# Patient Record
Sex: Female | Born: 1965 | Race: Black or African American | Hispanic: No | Marital: Married | Smoking: Never smoker
Health system: Southern US, Community
[De-identification: ages and names within clinical notes are randomized; demographics above are authoritative.]

## PROBLEM LIST (undated history)

## (undated) DIAGNOSIS — Z9189 Other specified personal risk factors, not elsewhere classified: Secondary | ICD-10-CM

## (undated) DIAGNOSIS — R5383 Other fatigue: Secondary | ICD-10-CM

## (undated) DIAGNOSIS — F32A Depression, unspecified: Secondary | ICD-10-CM

## (undated) DIAGNOSIS — R42 Dizziness and giddiness: Secondary | ICD-10-CM

## (undated) DIAGNOSIS — F419 Anxiety disorder, unspecified: Secondary | ICD-10-CM

## (undated) DIAGNOSIS — IMO0002 Reserved for concepts with insufficient information to code with codable children: Secondary | ICD-10-CM

## (undated) DIAGNOSIS — S82899A Other fracture of unspecified lower leg, initial encounter for closed fracture: Secondary | ICD-10-CM

## (undated) DIAGNOSIS — R0602 Shortness of breath: Secondary | ICD-10-CM

## (undated) HISTORY — DX: Other fatigue: R53.83

## (undated) HISTORY — PX: TUBAL LIGATION: SHX77

## (undated) HISTORY — DX: Other specified personal risk factors, not elsewhere classified: Z91.89

## (undated) HISTORY — DX: Reserved for concepts with insufficient information to code with codable children: IMO0002

## (undated) HISTORY — DX: Shortness of breath: R06.02

## (undated) HISTORY — PX: COLPOSCOPY: SHX161

## (undated) HISTORY — DX: Depression, unspecified: F32.A

## (undated) HISTORY — DX: Anxiety disorder, unspecified: F41.9

---

## 2001-04-26 ENCOUNTER — Other Ambulatory Visit: Admission: RE | Admit: 2001-04-26 | Discharge: 2001-04-26 | Payer: Self-pay | Admitting: Gynecology

## 2001-04-26 ENCOUNTER — Encounter (INDEPENDENT_AMBULATORY_CARE_PROVIDER_SITE_OTHER): Payer: Self-pay

## 2001-08-08 ENCOUNTER — Other Ambulatory Visit: Admission: RE | Admit: 2001-08-08 | Discharge: 2001-08-08 | Payer: Self-pay | Admitting: Gynecology

## 2002-04-14 ENCOUNTER — Other Ambulatory Visit: Admission: RE | Admit: 2002-04-14 | Discharge: 2002-04-14 | Payer: Self-pay | Admitting: Gynecology

## 2002-12-04 DIAGNOSIS — IMO0002 Reserved for concepts with insufficient information to code with codable children: Secondary | ICD-10-CM

## 2002-12-04 HISTORY — DX: Reserved for concepts with insufficient information to code with codable children: IMO0002

## 2003-06-10 ENCOUNTER — Other Ambulatory Visit: Admission: RE | Admit: 2003-06-10 | Discharge: 2003-06-10 | Payer: Self-pay | Admitting: Gynecology

## 2003-07-22 ENCOUNTER — Encounter: Payer: Self-pay | Admitting: Gynecology

## 2003-07-22 ENCOUNTER — Ambulatory Visit (HOSPITAL_COMMUNITY): Admission: RE | Admit: 2003-07-22 | Discharge: 2003-07-22 | Payer: Self-pay | Admitting: Gynecology

## 2003-10-05 ENCOUNTER — Other Ambulatory Visit: Admission: RE | Admit: 2003-10-05 | Discharge: 2003-10-05 | Payer: Self-pay | Admitting: Gynecology

## 2004-06-13 ENCOUNTER — Other Ambulatory Visit: Admission: RE | Admit: 2004-06-13 | Discharge: 2004-06-13 | Payer: Self-pay | Admitting: Gynecology

## 2004-09-01 ENCOUNTER — Ambulatory Visit (HOSPITAL_COMMUNITY): Admission: RE | Admit: 2004-09-01 | Discharge: 2004-09-01 | Payer: Self-pay | Admitting: Gynecology

## 2005-07-12 ENCOUNTER — Other Ambulatory Visit: Admission: RE | Admit: 2005-07-12 | Discharge: 2005-07-12 | Payer: Self-pay | Admitting: Gynecology

## 2006-01-17 ENCOUNTER — Ambulatory Visit (HOSPITAL_COMMUNITY): Admission: RE | Admit: 2006-01-17 | Discharge: 2006-01-17 | Payer: Self-pay | Admitting: Gynecology

## 2006-01-17 ENCOUNTER — Emergency Department (HOSPITAL_COMMUNITY): Admission: EM | Admit: 2006-01-17 | Discharge: 2006-01-17 | Payer: Self-pay | Admitting: Emergency Medicine

## 2006-08-15 ENCOUNTER — Other Ambulatory Visit: Admission: RE | Admit: 2006-08-15 | Discharge: 2006-08-15 | Payer: Self-pay | Admitting: Gynecology

## 2007-02-14 ENCOUNTER — Ambulatory Visit (HOSPITAL_COMMUNITY): Admission: RE | Admit: 2007-02-14 | Discharge: 2007-02-14 | Payer: Self-pay | Admitting: Gynecology

## 2007-08-26 ENCOUNTER — Other Ambulatory Visit: Admission: RE | Admit: 2007-08-26 | Discharge: 2007-08-26 | Payer: Self-pay | Admitting: Gynecology

## 2008-02-17 ENCOUNTER — Ambulatory Visit (HOSPITAL_COMMUNITY): Admission: RE | Admit: 2008-02-17 | Discharge: 2008-02-17 | Payer: Self-pay | Admitting: Gynecology

## 2008-04-22 ENCOUNTER — Ambulatory Visit: Payer: Self-pay | Admitting: Vascular Surgery

## 2008-09-02 ENCOUNTER — Ambulatory Visit: Payer: Self-pay | Admitting: Gynecology

## 2008-09-02 ENCOUNTER — Other Ambulatory Visit: Admission: RE | Admit: 2008-09-02 | Discharge: 2008-09-02 | Payer: Self-pay | Admitting: Gynecology

## 2008-09-02 ENCOUNTER — Encounter: Payer: Self-pay | Admitting: Gynecology

## 2008-10-05 ENCOUNTER — Ambulatory Visit: Payer: Self-pay | Admitting: Gynecology

## 2009-03-25 ENCOUNTER — Ambulatory Visit (HOSPITAL_COMMUNITY): Admission: RE | Admit: 2009-03-25 | Discharge: 2009-03-25 | Payer: Self-pay | Admitting: Gynecology

## 2009-11-09 ENCOUNTER — Other Ambulatory Visit: Admission: RE | Admit: 2009-11-09 | Discharge: 2009-11-09 | Payer: Self-pay | Admitting: Gynecology

## 2009-11-09 ENCOUNTER — Ambulatory Visit: Payer: Self-pay | Admitting: Gynecology

## 2009-11-15 ENCOUNTER — Ambulatory Visit: Payer: Self-pay | Admitting: Gynecology

## 2009-12-23 ENCOUNTER — Ambulatory Visit: Payer: Self-pay | Admitting: Gynecology

## 2010-01-04 HISTORY — PX: ABDOMINAL HYSTERECTOMY: SHX81

## 2010-01-19 ENCOUNTER — Ambulatory Visit: Payer: Self-pay | Admitting: Gynecology

## 2010-01-24 ENCOUNTER — Encounter: Payer: Self-pay | Admitting: Gynecology

## 2010-01-24 ENCOUNTER — Ambulatory Visit: Payer: Self-pay | Admitting: Gynecology

## 2010-01-24 ENCOUNTER — Inpatient Hospital Stay (HOSPITAL_COMMUNITY): Admission: RE | Admit: 2010-01-24 | Discharge: 2010-01-26 | Payer: Self-pay | Admitting: Gynecology

## 2010-02-07 ENCOUNTER — Ambulatory Visit: Payer: Self-pay | Admitting: Gynecology

## 2010-02-21 ENCOUNTER — Ambulatory Visit: Payer: Self-pay | Admitting: Gynecology

## 2010-03-03 ENCOUNTER — Ambulatory Visit: Payer: Self-pay | Admitting: Gynecology

## 2010-03-18 ENCOUNTER — Ambulatory Visit: Payer: Self-pay | Admitting: Gynecology

## 2010-03-28 ENCOUNTER — Ambulatory Visit (HOSPITAL_COMMUNITY): Admission: RE | Admit: 2010-03-28 | Discharge: 2010-03-28 | Payer: Self-pay | Admitting: Gynecology

## 2010-04-04 ENCOUNTER — Ambulatory Visit: Payer: Self-pay | Admitting: Gynecology

## 2010-11-11 ENCOUNTER — Other Ambulatory Visit
Admission: RE | Admit: 2010-11-11 | Discharge: 2010-11-11 | Payer: Self-pay | Source: Home / Self Care | Admitting: Gynecology

## 2010-11-11 ENCOUNTER — Ambulatory Visit: Payer: Self-pay | Admitting: Gynecology

## 2010-11-17 ENCOUNTER — Ambulatory Visit: Payer: Self-pay | Admitting: Gynecology

## 2010-12-25 ENCOUNTER — Encounter: Payer: Self-pay | Admitting: Gynecology

## 2011-02-22 LAB — CBC
HCT: 38.2 % (ref 36.0–46.0)
Hemoglobin: 10.5 g/dL — ABNORMAL LOW (ref 12.0–15.0)
MCHC: 33.1 g/dL (ref 30.0–36.0)
MCHC: 33.5 g/dL (ref 30.0–36.0)
MCV: 94.7 fL (ref 78.0–100.0)
Platelets: 185 10*3/uL (ref 150–400)
Platelets: 228 10*3/uL (ref 150–400)
RDW: 12.3 % (ref 11.5–15.5)
RDW: 12.3 % (ref 11.5–15.5)

## 2011-02-22 LAB — PREGNANCY, URINE: Preg Test, Ur: NEGATIVE

## 2011-02-24 ENCOUNTER — Other Ambulatory Visit: Payer: Self-pay | Admitting: Gynecology

## 2011-02-24 DIAGNOSIS — Z1231 Encounter for screening mammogram for malignant neoplasm of breast: Secondary | ICD-10-CM

## 2011-03-30 ENCOUNTER — Ambulatory Visit (HOSPITAL_COMMUNITY)
Admission: RE | Admit: 2011-03-30 | Discharge: 2011-03-30 | Disposition: A | Discharge status: Home / Self Care | Payer: BC Managed Care – PPO | Source: Ambulatory Visit | Attending: Gynecology | Admitting: Gynecology

## 2011-03-30 DIAGNOSIS — Z1231 Encounter for screening mammogram for malignant neoplasm of breast: Secondary | ICD-10-CM

## 2011-04-18 NOTE — Consult Note (Signed)
NEW PATIENT CONSULTATION   [name], [name]  DOB:  Oct 22, 1966                                       04/22/2008  [id number]#:[id number]   Laura [name] presents today for evaluation of lower extremity  symptoms.  She reports aching sensation in her legs, which is more  progressive at the end of the day.  She denies any swelling and does not  have any history of deep venous thrombosis or varicose veins.  She  reports this is equal in both legs and worse in her knees distally.   PAST HISTORY:  Significant for fatigue, SEASONAL ALLERGIES, history of  heart palpitations.   SURGICAL HISTORY:  Significant only for a prior cesarean section in  1993.   FAMILY HISTORY:  Negative for venous varicosities.   SOCIAL HISTORY:  She does not smoke, has rare social alcohol  consumption.   REVIEW OF SYSTEMS:  Otherwise negative.   PHYSICAL EXAM:  This is a well-developed, well-nourished black female  appearing her stated age 45.  She does have 2+ dorsalis pedis pulses  bilaterally.  She does not have any swelling, varicosities or reticular  veins in her lower extremities.  She does have scattered spider vein  telangiectasia most prominently in her popliteal space and her lateral  thighs bilaterally.   She underwent a screening handheld venous duplex by me and this shows  normal size great and small saphenous vein bilaterally with no evidence  of reflux.  I discussed the significance of this with [name].  I  explained that I do not see any evidence of arterial or venous pathology  to explain her tired achy sensation in her legs.  She does have  scattered spider vein telangiectasia and I explained the treatment for  these.  I did explain the option of sclerotherapy in our office for  cosmetic treatment of her spider veins.  I discussed the expected number  of sessions required and the out-of-pocket expense for this since  insurance would not cover this.  She will  consider this option and  notify us should she wish to proceed with sclerotherapy.   Larina Earthly, M.D.  Electronically Signed   TFE/MEDQ  D:  04/22/2008  T:  04/23/2008  Job:  [id number]   cc:   Thora Lance, M.D.

## 2012-03-07 ENCOUNTER — Other Ambulatory Visit: Payer: Self-pay | Admitting: Gynecology

## 2012-03-07 DIAGNOSIS — Z1231 Encounter for screening mammogram for malignant neoplasm of breast: Secondary | ICD-10-CM

## 2012-04-01 ENCOUNTER — Ambulatory Visit (HOSPITAL_COMMUNITY)
Admission: RE | Admit: 2012-04-01 | Discharge: 2012-04-01 | Disposition: A | Discharge status: Home / Self Care | Payer: BC Managed Care – PPO | Source: Ambulatory Visit | Attending: Gynecology | Admitting: Gynecology

## 2012-04-01 DIAGNOSIS — Z1231 Encounter for screening mammogram for malignant neoplasm of breast: Secondary | ICD-10-CM | POA: Insufficient documentation

## 2012-08-19 ENCOUNTER — Ambulatory Visit: Payer: BC Managed Care – PPO | Attending: Internal Medicine

## 2012-08-19 DIAGNOSIS — M545 Low back pain, unspecified: Secondary | ICD-10-CM | POA: Insufficient documentation

## 2012-08-19 DIAGNOSIS — IMO0001 Reserved for inherently not codable concepts without codable children: Secondary | ICD-10-CM | POA: Insufficient documentation

## 2012-08-19 DIAGNOSIS — R5381 Other malaise: Secondary | ICD-10-CM | POA: Insufficient documentation

## 2012-08-26 ENCOUNTER — Ambulatory Visit: Payer: BC Managed Care – PPO

## 2012-08-29 ENCOUNTER — Ambulatory Visit: Payer: BC Managed Care – PPO

## 2013-03-07 ENCOUNTER — Other Ambulatory Visit: Payer: Self-pay | Admitting: Gynecology

## 2013-03-07 DIAGNOSIS — Z1231 Encounter for screening mammogram for malignant neoplasm of breast: Secondary | ICD-10-CM

## 2013-04-02 ENCOUNTER — Ambulatory Visit (HOSPITAL_COMMUNITY): Payer: BC Managed Care – PPO

## 2013-04-02 ENCOUNTER — Ambulatory Visit (HOSPITAL_COMMUNITY)
Admission: RE | Admit: 2013-04-02 | Discharge: 2013-04-02 | Disposition: A | Discharge status: Home / Self Care | Payer: BC Managed Care – PPO | Source: Ambulatory Visit | Attending: Gynecology | Admitting: Gynecology

## 2013-04-02 DIAGNOSIS — Z1231 Encounter for screening mammogram for malignant neoplasm of breast: Secondary | ICD-10-CM | POA: Insufficient documentation

## 2013-07-30 ENCOUNTER — Encounter: Payer: Self-pay | Admitting: Gynecology

## 2013-07-30 ENCOUNTER — Ambulatory Visit (INDEPENDENT_AMBULATORY_CARE_PROVIDER_SITE_OTHER): Payer: BC Managed Care – PPO | Admitting: Gynecology

## 2013-07-30 ENCOUNTER — Other Ambulatory Visit (HOSPITAL_COMMUNITY)
Admission: RE | Admit: 2013-07-30 | Discharge: 2013-07-30 | Disposition: A | Discharge status: Home / Self Care | Payer: BC Managed Care – PPO | Source: Ambulatory Visit | Attending: Gynecology | Admitting: Gynecology

## 2013-07-30 VITALS — BP 110/72 | Ht 61.5 in | Wt 144.0 lb

## 2013-07-30 DIAGNOSIS — N951 Menopausal and female climacteric states: Secondary | ICD-10-CM

## 2013-07-30 DIAGNOSIS — Z01419 Encounter for gynecological examination (general) (routine) without abnormal findings: Secondary | ICD-10-CM | POA: Insufficient documentation

## 2013-07-30 NOTE — Progress Notes (Signed)
Laura [name] 08/13/1966 [id number]        47 y.o.  [id number] for annual exam.  Several issues noted below.  Past medical history,surgical history, medications, allergies, family history and social history were all reviewed and documented in the EPIC chart.  ROS:  Performed and pertinent positives and negatives are included in the history, assessment and plan .  Exam: Kim assistant Filed Vitals:   07/30/13 1358  BP: 110/72  Height: 5' 1.5" (1.562 m)  Weight: 144 lb (65.318 kg)   General appearance  Normal Skin grossly normal Head/Neck normal with no cervical or supraclavicular adenopathy thyroid normal Lungs  clear Cardiac RR, without RMG Abdominal  soft, nontender, without masses, organomegaly or hernia Breasts  examined lying and sitting without masses, retractions, discharge or axillary adenopathy. Pelvic  Ext/BUS/vagina  normal Pap done  Adnexa  Without masses or tenderness    Anus and perineum  normal   Rectovaginal  normal sphincter tone without palpated masses or tenderness.    Assessment/Plan:  47 y.o. [id number] female for annual exam.   1. Menopausal symptoms. Patient is having some hot flushes and night sweats as well as fuzzy thinking. No real dyspareunia or vaginal dryness. Status post TAH for leiomyomata.  I reviewed the whole issue of HRT with her to include the WHI study with increased risk of stroke, heart attack, DVT and breast cancer. The ACOG and NAMS statements for lowest dose for the shortest period of time reviewed. Transdermal versus oral first-pass effect benefit discussed.  Will check baseline FSH/TSH. Patient will follow up results. If it looks like menopause then patient will decide if she wants a trial of HRT. 2. Pap smear done today. History of ASCUS with negative colposcopy biopsies 2004. Normal Pap smears since then. Discussed stop screening altogether she is status post hysterectomy for benign indications versus less frequent screening intervals. We'll  readdress on an annual basis. 3. Mammography 03/2013. Continue with annual mammography. 4. Colonoscopy. Discussed screening colonoscopy at age 8. 5. Health maintenance. No other blood work done as it is done through her primary physician's office. Followup for hormone results otherwise one year.  Note: This document was prepared with digital dictation and possible smart phrase technology. Any transcriptional errors that result from this process are unintentional.   Dara Lords MD, 2:23 PM 07/30/2013

## 2013-07-30 NOTE — Patient Instructions (Signed)
Followup for hormone results. Otherwise followup in 1 year for annual exam.

## 2013-07-30 NOTE — Addendum Note (Signed)
Addended by: Dayna Barker on: 07/30/2013 02:28 PM   Modules accepted: Orders

## 2013-07-31 ENCOUNTER — Encounter: Payer: Self-pay | Admitting: Gynecology

## 2013-07-31 LAB — URINALYSIS W MICROSCOPIC + REFLEX CULTURE
Bacteria, UA: NONE SEEN
Bilirubin Urine: NEGATIVE
Crystals: NONE SEEN
Nitrite: NEGATIVE
Protein, ur: NEGATIVE mg/dL
Specific Gravity, Urine: 1.022 (ref 1.005–1.030)
Urobilinogen, UA: 0.2 mg/dL (ref 0.0–1.0)

## 2013-07-31 LAB — TSH: TSH: 1.147 u[IU]/mL (ref 0.350–4.500)

## 2013-08-01 LAB — URINE CULTURE
Colony Count: NO GROWTH
Organism ID, Bacteria: NO GROWTH

## 2013-08-06 ENCOUNTER — Telehealth: Payer: Self-pay

## 2013-08-06 NOTE — Telephone Encounter (Signed)
Patient informed she will hear from [facility] with nutritionist appt.

## 2013-08-06 NOTE — Telephone Encounter (Signed)
Patient was informed FSH menopausal. She wants to monitor symptoms for the time being. She asked if she might get referral to nutritionist to go and review healthy eating, etc. To keep her from gaining weight in menopause.  Ok to refer her to [facility] Nutritionist?

## 2013-08-06 NOTE — Telephone Encounter (Signed)
Forwarded below to Fletcher so she can do referral to [facility] Nutritionist.

## 2013-08-06 NOTE — Telephone Encounter (Signed)
Okay to refer to [facility] nutrition

## 2013-08-07 ENCOUNTER — Telehealth: Payer: Self-pay | Admitting: *Deleted

## 2013-08-07 DIAGNOSIS — Z789 Other specified health status: Secondary | ICD-10-CM

## 2013-08-07 NOTE — Telephone Encounter (Signed)
Message copied by Aura Camps on Thu Aug 07, 2013 11:08 AM ------      Message from: Keenan Bachelor      Created: Wed Aug 06, 2013  3:41 PM      Regarding: nutritionist referral       Call Documentation         Dara Lords, MD at 08/06/2013  3:29 PM         Status: Signed                                  Okay to refer to [place] nutrition                        Keenan Bachelor at 08/06/2013  3:20 PM         Status: Signed                                  Patient was informed FSH menopausal. She wants to monitor symptoms for the time being. She asked if she might get referral to nutritionist to go and review healthy eating, etc. To keep her from gaining weight in menopause.  Ok to refer her to [facility] Nutritionist?               ------

## 2013-08-07 NOTE — Telephone Encounter (Signed)
Referral placed for the below, they will contact patient with time and date.

## 2013-08-11 NOTE — Telephone Encounter (Signed)
appt 08/13/13 @8 :00 am

## 2013-08-13 ENCOUNTER — Ambulatory Visit: Payer: BC Managed Care – PPO | Admitting: *Deleted

## 2013-08-28 ENCOUNTER — Ambulatory Visit: Payer: BC Managed Care – PPO | Admitting: *Deleted

## 2014-03-03 ENCOUNTER — Other Ambulatory Visit: Payer: Self-pay | Admitting: Gynecology

## 2014-03-03 DIAGNOSIS — Z1231 Encounter for screening mammogram for malignant neoplasm of breast: Secondary | ICD-10-CM

## 2014-03-12 ENCOUNTER — Ambulatory Visit (INDEPENDENT_AMBULATORY_CARE_PROVIDER_SITE_OTHER): Payer: Managed Care, Other (non HMO) | Admitting: Otolaryngology

## 2014-03-12 DIAGNOSIS — R42 Dizziness and giddiness: Secondary | ICD-10-CM

## 2014-03-12 DIAGNOSIS — H93299 Other abnormal auditory perceptions, unspecified ear: Secondary | ICD-10-CM

## 2014-04-03 ENCOUNTER — Ambulatory Visit (HOSPITAL_COMMUNITY): Payer: BC Managed Care – PPO

## 2014-04-06 ENCOUNTER — Ambulatory Visit (HOSPITAL_COMMUNITY)
Admission: RE | Admit: 2014-04-06 | Discharge: 2014-04-06 | Disposition: A | Discharge status: Home / Self Care | Payer: Managed Care, Other (non HMO) | Source: Ambulatory Visit | Attending: Gynecology | Admitting: Gynecology

## 2014-04-06 DIAGNOSIS — Z1231 Encounter for screening mammogram for malignant neoplasm of breast: Secondary | ICD-10-CM | POA: Insufficient documentation

## 2014-04-07 ENCOUNTER — Other Ambulatory Visit: Payer: Self-pay | Admitting: Gynecology

## 2014-04-07 DIAGNOSIS — R928 Other abnormal and inconclusive findings on diagnostic imaging of breast: Secondary | ICD-10-CM

## 2014-04-16 ENCOUNTER — Ambulatory Visit
Admission: RE | Admit: 2014-04-16 | Discharge: 2014-04-16 | Disposition: A | Discharge status: Home / Self Care | Payer: Managed Care, Other (non HMO) | Source: Ambulatory Visit | Attending: Gynecology | Admitting: Gynecology

## 2014-04-16 DIAGNOSIS — R928 Other abnormal and inconclusive findings on diagnostic imaging of breast: Secondary | ICD-10-CM

## 2014-06-12 ENCOUNTER — Encounter (INDEPENDENT_AMBULATORY_CARE_PROVIDER_SITE_OTHER): Payer: Self-pay | Admitting: *Deleted

## 2014-06-15 ENCOUNTER — Other Ambulatory Visit (INDEPENDENT_AMBULATORY_CARE_PROVIDER_SITE_OTHER): Payer: Self-pay | Admitting: *Deleted

## 2014-06-15 ENCOUNTER — Encounter (INDEPENDENT_AMBULATORY_CARE_PROVIDER_SITE_OTHER): Payer: Self-pay | Admitting: *Deleted

## 2014-06-15 DIAGNOSIS — Z1211 Encounter for screening for malignant neoplasm of colon: Secondary | ICD-10-CM

## 2014-06-15 DIAGNOSIS — Z800 Family history of malignant neoplasm of digestive organs: Secondary | ICD-10-CM

## 2014-06-15 NOTE — Progress Notes (Signed)
This encounter was created in error - please disregard.

## 2014-08-03 ENCOUNTER — Ambulatory Visit (INDEPENDENT_AMBULATORY_CARE_PROVIDER_SITE_OTHER): Payer: Managed Care, Other (non HMO) | Admitting: Gynecology

## 2014-08-03 ENCOUNTER — Encounter: Payer: Self-pay | Admitting: Gynecology

## 2014-08-03 VITALS — BP 110/70 | Ht 61.5 in | Wt 146.0 lb

## 2014-08-03 DIAGNOSIS — Z01419 Encounter for gynecological examination (general) (routine) without abnormal findings: Secondary | ICD-10-CM

## 2014-08-03 DIAGNOSIS — N951 Menopausal and female climacteric states: Secondary | ICD-10-CM

## 2014-08-03 NOTE — Progress Notes (Signed)
Laura [name] 03-Jan-1966 [id number]        48 y.o.  [id number] for annual exam.  Several issues noted below.  Past medical history,surgical history, problem list, medications, allergies, family history and social history were all reviewed and documented as reviewed in the EPIC chart.  ROS:  12 system ROS performed with pertinent positives and negatives included in the history, assessment and plan.   Additional significant findings :  None   Exam: Programmer, multimedia Vitals:   08/03/14 1510  BP: 110/70  Height: 5' 1.5" (1.562 m)  Weight: 146 lb (66.225 kg)   General appearance:  Normal affect, orientation and appearance. Skin: Grossly normal HEENT: Without gross lesions.  No cervical or supraclavicular adenopathy. Thyroid normal.  Lungs:  Clear without wheezing, rales or rhonchi Cardiac: RR, without RMG Abdominal:  Soft, nontender, without masses, guarding, rebound, organomegaly or hernia Breasts:  Examined lying and sitting without masses, retractions, discharge or axillary adenopathy. Pelvic:  Ext/BUS/vagina normal  Adnexa  Without masses or tenderness    Anus and perineum  Normal   Rectovaginal  Normal sphincter tone without palpated masses or tenderness.    Assessment/Plan:  48 y.o. [id number] female for annual exam.   1. Menopausal symptoms. Status post TAH 2011 for leiomyoma. Had the onset of hot flushes beginning last year. FSH was 51. Options for management to include observation, OTC products such as soy and HRT reviewed. Patient was not and is currently not interested in HRT. They are not overly bothersome to her and she prefers just to monitor at present. Will call if they worsen and she wants to rediscuss treatment options. Not having any issues with vaginal dryness or dyspareunia. 2. Pap smear 2014. No Pap smear done today. History of ASCUS with negative colposcopy 2004. Status post hysterectomy for benign indications. Options to stop screening altogether or less  frequent screening intervals reviewed. Will readdress on an annual basis. 3. Mammography 04/2014. Continue with annual mammography. SBE monthly reviewed. 4. Colonoscopy appointment set for October. 5. DEXA never. We'll plan further into the menopause. 6. Health maintenance. Patient reports blood work done at her primary physician's office. Followup in one year, sooner as needed.   Note: This document was prepared with digital dictation and possible smart phrase technology. Any transcriptional errors that result from this process are unintentional.   Anastasio Auerbach MD, 3:35 PM 08/03/2014

## 2014-08-03 NOTE — Patient Instructions (Signed)
You may obtain a copy of any labs that were done today by logging onto MyChart as outlined in the instructions provided with your AVS (after visit summary). The office will not call with normal lab results but certainly if there are any significant abnormalities then we will contact you.   Health Maintenance, Female A healthy lifestyle and preventative care can promote health and wellness.  Maintain regular health, dental, and eye exams.  Eat a healthy diet. Foods like vegetables, fruits, whole grains, low-fat dairy products, and lean protein foods contain the nutrients you need without too many calories. Decrease your intake of foods high in solid fats, added sugars, and salt. Get information about a proper diet from your caregiver, if necessary.  Regular physical exercise is one of the most important things you can do for your health. Most adults should get at least 150 minutes of moderate-intensity exercise (any activity that increases your heart rate and causes you to sweat) each week. In addition, most adults need muscle-strengthening exercises on 2 or more days a week.   Maintain a healthy weight. The body mass index (BMI) is a screening tool to identify possible weight problems. It provides an estimate of body fat based on height and weight. Your caregiver can help determine your BMI, and can help you achieve or maintain a healthy weight. For adults 20 years and older:  A BMI below 18.5 is considered underweight.  A BMI of 18.5 to 24.9 is normal.  A BMI of 25 to 29.9 is considered overweight.  A BMI of 30 and above is considered obese.  Maintain normal blood lipids and cholesterol by exercising and minimizing your intake of saturated fat. Eat a balanced diet with plenty of fruits and vegetables. Blood tests for lipids and cholesterol should begin at age 61 and be repeated every 5 years. If your lipid or cholesterol levels are high, you are over 50, or you are a high risk for heart  disease, you may need your cholesterol levels checked more frequently.Ongoing high lipid and cholesterol levels should be treated with medicines if diet and exercise are not effective.  If you smoke, find out from your caregiver how to quit. If you do not use tobacco, do not start.  Lung cancer screening is recommended for adults aged 33 80 years who are at high risk for developing lung cancer because of a history of smoking. Yearly low-dose computed tomography (CT) is recommended for people who have at least a 30-pack-year history of smoking and are a current smoker or have quit within the past 15 years. A pack year of smoking is smoking an average of 1 pack of cigarettes a day for 1 year (for example: 1 pack a day for 30 years or 2 packs a day for 15 years). Yearly screening should continue until the smoker has stopped smoking for at least 15 years. Yearly screening should also be stopped for people who develop a health problem that would prevent them from having lung cancer treatment.  If you are pregnant, do not drink alcohol. If you are breastfeeding, be very cautious about drinking alcohol. If you are not pregnant and choose to drink alcohol, do not exceed 1 drink per day. One drink is considered to be 12 ounces (355 mL) of beer, 5 ounces (148 mL) of wine, or 1.5 ounces (44 mL) of liquor.  Avoid use of street drugs. Do not share needles with anyone. Ask for help if you need support or instructions about stopping  the use of drugs.  High blood pressure causes heart disease and increases the risk of stroke. Blood pressure should be checked at least every 1 to 2 years. Ongoing high blood pressure should be treated with medicines, if weight loss and exercise are not effective.  If you are 59 to 50 years old, ask your caregiver if you should take aspirin to prevent strokes.  Diabetes screening involves taking a blood sample to check your fasting blood sugar level. This should be done once every 3  years, after age 91, if you are within normal weight and without risk factors for diabetes. Testing should be considered at a younger age or be carried out more frequently if you are overweight and have at least 1 risk factor for diabetes.  Breast cancer screening is essential preventative care for women. You should practice "breast self-awareness." This means understanding the normal appearance and feel of your breasts and may include breast self-examination. Any changes detected, no matter how small, should be reported to a caregiver. Women in their 66s and 30s should have a clinical breast exam (CBE) by a caregiver as part of a regular health exam every 1 to 3 years. After age 101, women should have a CBE every year. Starting at age 100, women should consider having a mammogram (breast X-ray) every year. Women who have a family history of breast cancer should talk to their caregiver about genetic screening. Women at a high risk of breast cancer should talk to their caregiver about having an MRI and a mammogram every year.  Breast cancer gene (BRCA)-related cancer risk assessment is recommended for women who have family members with BRCA-related cancers. BRCA-related cancers include breast, ovarian, tubal, and peritoneal cancers. Having family members with these cancers may be associated with an increased risk for harmful changes (mutations) in the breast cancer genes BRCA1 and BRCA2. Results of the assessment will determine the need for genetic counseling and BRCA1 and BRCA2 testing.  The Pap test is a screening test for cervical cancer. Women should have a Pap test starting at age 57. Between ages 25 and 35, Pap tests should be repeated every 2 years. Beginning at age 37, you should have a Pap test every 3 years as long as the past 3 Pap tests have been normal. If you had a hysterectomy for a problem that was not cancer or a condition that could lead to cancer, then you no longer need Pap tests. If you are  between ages 50 and 76, and you have had normal Pap tests going back 10 years, you no longer need Pap tests. If you have had past treatment for cervical cancer or a condition that could lead to cancer, you need Pap tests and screening for cancer for at least 20 years after your treatment. If Pap tests have been discontinued, risk factors (such as a new sexual partner) need to be reassessed to determine if screening should be resumed. Some women have medical problems that increase the chance of getting cervical cancer. In these cases, your caregiver may recommend more frequent screening and Pap tests.  The human papillomavirus (HPV) test is an additional test that may be used for cervical cancer screening. The HPV test looks for the virus that can cause the cell changes on the cervix. The cells collected during the Pap test can be tested for HPV. The HPV test could be used to screen women aged 44 years and older, and should be used in women of any age  who have unclear Pap test results. After the age of 55, women should have HPV testing at the same frequency as a Pap test.  Colorectal cancer can be detected and often prevented. Most routine colorectal cancer screening begins at the age of 44 and continues through age 20. However, your caregiver may recommend screening at an earlier age if you have risk factors for colon cancer. On a yearly basis, your caregiver may provide home test kits to check for hidden blood in the stool. Use of a small camera at the end of a tube, to directly examine the colon (sigmoidoscopy or colonoscopy), can detect the earliest forms of colorectal cancer. Talk to your caregiver about this at age 86, when routine screening begins. Direct examination of the colon should be repeated every 5 to 10 years through age 13, unless early forms of pre-cancerous polyps or small growths are found.  Hepatitis C blood testing is recommended for all people born from [birth year] through 1967 and any  individual with known risks for hepatitis C.  Practice safe sex. Use condoms and avoid high-risk sexual practices to reduce the spread of sexually transmitted infections (STIs). Sexually active women aged 36 and younger should be checked for Chlamydia, which is a common sexually transmitted infection. Older women with new or multiple partners should also be tested for Chlamydia. Testing for other STIs is recommended if you are sexually active and at increased risk.  Osteoporosis is a disease in which the bones lose minerals and strength with aging. This can result in serious bone fractures. The risk of osteoporosis can be identified using a bone density scan. Women ages 20 and over and women at risk for fractures or osteoporosis should discuss screening with their caregivers. Ask your caregiver whether you should be taking a calcium supplement or vitamin D to reduce the rate of osteoporosis.  Menopause can be associated with physical symptoms and risks. Hormone replacement therapy is available to decrease symptoms and risks. You should talk to your caregiver about whether hormone replacement therapy is right for you.  Use sunscreen. Apply sunscreen liberally and repeatedly throughout the day. You should seek shade when your shadow is shorter than you. Protect yourself by wearing long sleeves, pants, a wide-brimmed hat, and sunglasses year round, whenever you are outdoors.  Notify your caregiver of new moles or changes in moles, especially if there is a change in shape or color. Also notify your caregiver if a mole is larger than the size of a pencil eraser.  Stay current with your immunizations. Document Released: 06/05/2011 Document Revised: 03/17/2013 Document Reviewed: 06/05/2011 [facility] Patient Information 2014 [employer].

## 2014-08-04 LAB — URINALYSIS W MICROSCOPIC + REFLEX CULTURE
BACTERIA UA: NONE SEEN
Bilirubin Urine: NEGATIVE
CASTS: NONE SEEN
CRYSTALS: NONE SEEN
Glucose, UA: NEGATIVE mg/dL
Hgb urine dipstick: NEGATIVE
Ketones, ur: NEGATIVE mg/dL
NITRITE: NEGATIVE
PH: 5.5 (ref 5.0–8.0)
Protein, ur: NEGATIVE mg/dL
SQUAMOUS EPITHELIAL / LPF: NONE SEEN
Specific Gravity, Urine: 1.008 (ref 1.005–1.030)
UROBILINOGEN UA: 0.2 mg/dL (ref 0.0–1.0)

## 2014-08-05 ENCOUNTER — Telehealth (INDEPENDENT_AMBULATORY_CARE_PROVIDER_SITE_OTHER): Payer: Self-pay | Admitting: *Deleted

## 2014-08-05 DIAGNOSIS — Z1211 Encounter for screening for malignant neoplasm of colon: Secondary | ICD-10-CM

## 2014-08-05 NOTE — Telephone Encounter (Signed)
Patient needs movi prep 

## 2014-08-06 ENCOUNTER — Other Ambulatory Visit: Payer: Self-pay | Admitting: Gynecology

## 2014-08-06 MED ORDER — AMPICILLIN 500 MG PO CAPS: 500.0000 mg | ORAL_CAPSULE | Freq: Four times a day (QID) | ORAL | Status: DC

## 2014-08-07 LAB — URINE CULTURE: Colony Count: 15000

## 2014-08-07 MED ORDER — PEG-KCL-NACL-NASULF-NA ASC-C 100 G PO SOLR: 1.0000 | Freq: Once | ORAL | Status: DC

## 2014-08-14 ENCOUNTER — Encounter (HOSPITAL_COMMUNITY): Payer: Self-pay | Admitting: Pharmacy Technician

## 2014-08-19 ENCOUNTER — Telehealth (INDEPENDENT_AMBULATORY_CARE_PROVIDER_SITE_OTHER): Payer: Self-pay | Admitting: *Deleted

## 2014-08-19 NOTE — Telephone Encounter (Signed)
  Procedure: tcs  Reason/Indication:  Screening, fam hx colon ca  Has patient had this procedure before?  no  If so, when, by whom and where?    Is there a family history of colon cancer?  Yes, grandmother  Who?  What age when diagnosed?    Is patient diabetic?   no      Does patient have prosthetic heart valve?  no  Do you have a pacemaker?  no  Has patient ever had endocarditis? no  Has patient had joint replacement within last 12 months?  no  Does patient tend to be constipated or take laxatives? no  Is patient on Coumadin, Plavix and/or Aspirin? no  Medications: vit d 2000 mg daily  Allergies: nkda  Medication Adjustment:   Procedure date & time: 09/03/14 at 830

## 2014-08-19 NOTE — Telephone Encounter (Signed)
agree

## 2014-08-31 ENCOUNTER — Telehealth (INDEPENDENT_AMBULATORY_CARE_PROVIDER_SITE_OTHER): Payer: Self-pay | Admitting: *Deleted

## 2014-08-31 DIAGNOSIS — Z1211 Encounter for screening for malignant neoplasm of colon: Secondary | ICD-10-CM

## 2014-08-31 MED ORDER — PEG-KCL-NACL-NASULF-NA ASC-C 100 G PO SOLR: 1.0000 | Freq: Once | ORAL | Status: DC

## 2014-08-31 NOTE — Telephone Encounter (Signed)
Patient needs movi prep 

## 2014-09-03 ENCOUNTER — Encounter (HOSPITAL_COMMUNITY): Payer: Self-pay | Admitting: *Deleted

## 2014-09-03 ENCOUNTER — Encounter (HOSPITAL_COMMUNITY)
Admission: RE | Disposition: A | Discharge status: Home / Self Care | Payer: Self-pay | Source: Ambulatory Visit | Attending: Internal Medicine

## 2014-09-03 ENCOUNTER — Ambulatory Visit (HOSPITAL_COMMUNITY)
Admission: RE | Admit: 2014-09-03 | Discharge: 2014-09-03 | Disposition: A | Discharge status: Home / Self Care | Payer: Managed Care, Other (non HMO) | Source: Ambulatory Visit | Attending: Internal Medicine | Admitting: Internal Medicine

## 2014-09-03 DIAGNOSIS — Z1211 Encounter for screening for malignant neoplasm of colon: Secondary | ICD-10-CM | POA: Diagnosis present

## 2014-09-03 DIAGNOSIS — K562 Volvulus: Secondary | ICD-10-CM

## 2014-09-03 DIAGNOSIS — Z538 Procedure and treatment not carried out for other reasons: Secondary | ICD-10-CM | POA: Insufficient documentation

## 2014-09-03 DIAGNOSIS — Z800 Family history of malignant neoplasm of digestive organs: Secondary | ICD-10-CM

## 2014-09-03 HISTORY — PX: COLONOSCOPY: SHX5424

## 2014-09-03 SURGERY — COLONOSCOPY
Anesthesia: Moderate Sedation

## 2014-09-03 MED ORDER — SODIUM CHLORIDE 0.9 % IV SOLN
INTRAVENOUS | Status: DC
  Administered 2014-09-03: 1000 mL via INTRAVENOUS

## 2014-09-03 MED ORDER — MEPERIDINE HCL 50 MG/ML IJ SOLN
INTRAMUSCULAR | Status: DC | PRN
  Administered 2014-09-03 (×2): 25 mg via INTRAVENOUS

## 2014-09-03 MED ORDER — MIDAZOLAM HCL 5 MG/5ML IJ SOLN
INTRAMUSCULAR | Status: AC
  Filled 2014-09-03: qty 15

## 2014-09-03 MED ORDER — MEPERIDINE HCL 50 MG/ML IJ SOLN
INTRAMUSCULAR | Status: AC
  Filled 2014-09-03: qty 1

## 2014-09-03 MED ORDER — MIDAZOLAM HCL 5 MG/5ML IJ SOLN
INTRAMUSCULAR | Status: DC | PRN
  Administered 2014-09-03: 2 mg via INTRAVENOUS
  Administered 2014-09-03 (×2): 1 mg via INTRAVENOUS
  Administered 2014-09-03: 2 mg via INTRAVENOUS

## 2014-09-03 NOTE — H&P (Signed)
Laura [name] is an 48 y.o. female.   Chief Complaint: Patient is here for colonoscopy. HPI: Patient is a 48 year old Laura [name] female who was screening exam. She denies abdominal pain change in bowel habits rectal bleeding. History significant for choriocarcinoma in maternal grandmother and great aunt on mother's side and they were in her 74s. Her mothers first cousin also had colon cancer in her 83s.  Past Medical History  Diagnosis Date  . Fibroid   . ASCUS favor benign 2004    colposcopy negative    Past Surgical History  Procedure Laterality Date  . Colposcopy    . Tubal ligation    . Cesarean section    . Abdominal hysterectomy  01/2010    Leiomyomata    Family History  Problem Relation Age of Onset  . Stroke Mother   . Cancer Maternal Grandmother     Colon cancer   Social History:  reports that she has never smoked. She does not have any smokeless tobacco history on file. She reports that she drinks alcohol. She reports that she does not use illicit drugs.  Allergies: No Known Allergies  Medications Prior to Admission  Medication Sig Dispense Refill  . Cholecalciferol (VITAMIN D PO) Take 1 tablet by mouth daily.       . peg 3350 powder (MOVIPREP) 100 G SOLR Take 1 kit (200 g total) by mouth once.  1 kit  0    No results found for this or any previous visit (from the past 48 hour(s)). No results found.  ROS  Blood pressure 106/70, pulse 78, temperature 98.1 F (36.7 C), temperature source Oral, resp. rate 21, height 5' 1"  (1.549 m), weight 138 lb 3.2 oz (62.687 kg), SpO2 99.00%. Physical Exam  Constitutional: She appears well-developed and well-nourished.  HENT:  Mouth/Throat: Oropharynx is clear and moist.  Eyes: Conjunctivae are normal. No scleral icterus.  Neck: No thyromegaly present.  Cardiovascular: Normal rate, regular rhythm and normal heart sounds.   No murmur heard. Respiratory: Effort normal.  GI: Soft. She exhibits no distension and no mass.  There is no tenderness.  Musculoskeletal: She exhibits no edema.  Lymphadenopathy:    She has no cervical adenopathy.  Neurological: She is alert.  Skin: Skin is warm and dry.     Assessment/Plan Screening colonoscopy. Family history of colon carcinoma and one second-degree into third degree relatives.  Laura [name] U 09/03/2014, 8:30 AM

## 2014-09-03 NOTE — Discharge Instructions (Signed)
Colonoscopy, Care After °Refer to this sheet in the next few weeks. These instructions provide you with information on caring for yourself after your procedure. Your health care provider may also give you more specific instructions. Your treatment has been planned according to current medical practices, but problems sometimes occur. Call your health care provider if you have any problems or questions after your procedure. °WHAT TO EXPECT AFTER THE PROCEDURE  °After your procedure, it is typical to have the following: °· A small amount of blood in your stool. °· Moderate amounts of gas and mild abdominal cramping or bloating. ° ° °HOME CARE INSTRUCTIONS °· Do not drive, operate machinery, or sign important documents for 24 hours. °· You may shower and resume your regular physical activities, but move at a slower pace for the first 24 hours. °· Take frequent rest periods for the first 24 hours. °· Walk around or put a warm pack on your abdomen to help reduce abdominal cramping and bloating. °· Drink enough fluids to keep your urine clear or pale yellow. °· You may resume your normal diet as instructed by your health care provider. Avoid heavy or fried foods that are hard to digest. °· Avoid drinking alcohol for 24 hours or as instructed by your health care provider. °· Only take over-the-counter or prescription medicines as directed by your health care provider. °· If a tissue sample (biopsy) was taken during your procedure: °¨ Do not take aspirin or blood thinners for 7 days, or as instructed by your health care provider. °¨ Do not drink alcohol for 7 days, or as instructed by your health care provider. °¨ Eat soft foods for the first 24 hours. ° ° °SEEK MEDICAL CARE IF: °You have persistent spotting of blood in your stool 2-3 days after the procedure. ° ° °SEEK IMMEDIATE MEDICAL CARE IF: °· You have more than a small spotting of blood in your stool. °· You pass large blood clots in your stool. °· Your abdomen is  swollen (distended). °· You have nausea or vomiting. °· You have a fever. °· You have increasing abdominal pain that is not relieved with medicine. ° ° °

## 2014-09-03 NOTE — Op Note (Signed)
[facility] [address] [place], [address]   COLONOSCOPY PROCEDURE REPORT     EXAM DATE: Sep 08, 2014  PATIENT NAME:      Laura [name], Laura [name]           MR #:      [id number]  BIRTHDATE:       Oct 17, 1966      VISIT #:     [phone]  ATTENDING:     Hildred Laser, MD     STATUS:     outpatient REFERRING MD:      Lavone Orn, M.D. ASA CLASS:        Class I  INDICATIONS:  The patient is a 48 yr old female here for a colonoscopy due to patient's family history of colon cancer, distant relatives. PROCEDURE PERFORMED:     Colonoscopy, screening MEDICATIONS:     Cetacaine spray for oral pharyngeal topical anesthesia, Meperidine (Demerol) 50 mg IV, and Versed 6 mg IV  ESTIMATED BLOOD LOSS:     None  CONSENT: The patient understands the risks and benefits of the procedure and understands that these risks include, but are not limited to: sedation, allergic reaction, infection, perforation and/or bleeding. Alternative means of evaluation and treatment include, among others: physical exam, x-rays, and/or surgical intervention. The patient elects to proceed with this endoscopic procedure.  DESCRIPTION OF PROCEDURE: During intra-op preparation period all mechanical & medical equipment was checked for proper function. Hand hygiene and appropriate measures for infection prevention was taken. After the risks, benefits and alternatives of the procedure were thoroughly explained, Informed consent was verified, confirmed and timeout was successfully executed by the treatment team. A digital exam revealed no abnormalities of the rectum.      The [id number] ([id number]) endoscope was introduced through the anus and advanced to the hepatic flexure. The prep was excellent.. The instrument was then slowly withdrawn as the colon was fully examined.   COLON FINDINGS: Very tortuous sigmoid colon resulting in loop formation which could never be reduced.pediatric colonoscope  was exchanged with slim scope but could not reach cecum. Examination performed to hepatic flexure.no abnormalities noted in the segments that were examined.  Retroflexed views revealed no abnormalities. The scope was then completely withdrawn from the patient and the procedure terminated. WITHDRAWAL TIME: NA.    ADVERSE EVENTS:      There were no immediate complications.  IMPRESSIONS:     incomplete exam to the hepatic flexure. Patient has tortuous sigmoid colon resulting loop formation. no abnormalities noted in the segments that were examined. Wide QRS complexes noted on preprocedure rhythm strip.   RECOMMENDATIONS:     1.  My office will arrange to you to have a barium enema performed.  This is a radiology test to further examine your colon. 2.  12-lead EKG RECALL:     Return in 5 years for Colonoscopy.  next colonoscopy should be under fluoroscopy.  Hildred Laser, MD eSigned:  Hildred Laser, MD 08-Sep-2014 9:39 AM   cc:  CPT CODES: ICD CODES:  The ICD and CPT codes recommended by this software are interpretations from the data that the clinical staff has captured with the software.  The verification of the translation of this report to the ICD and CPT codes and modifiers is the sole responsibility of the health care institution and practicing physician where this report was generated.  [employer] will not be held responsible for the validity of the ICD and CPT codes included on this report.  AMA assumes no liability for data contained or not contained herein. CPT is a Designer, television/film set of the [employer].

## 2014-09-04 ENCOUNTER — Telehealth (INDEPENDENT_AMBULATORY_CARE_PROVIDER_SITE_OTHER): Payer: Self-pay | Admitting: *Deleted

## 2014-09-04 NOTE — Telephone Encounter (Signed)
Patient had TCS 10/1 and states she thinks you told her she needed to have an EKG scheduled -- please advise

## 2014-09-04 NOTE — Telephone Encounter (Signed)
Per Dr Laural Golden patient had abnormal EKG, he spoke to patient's PCP and the PCP's office will contact patient with appt, patient is aware

## 2014-09-07 ENCOUNTER — Encounter (HOSPITAL_COMMUNITY): Payer: Self-pay | Admitting: Internal Medicine

## 2014-09-14 ENCOUNTER — Other Ambulatory Visit (HOSPITAL_COMMUNITY): Payer: Self-pay | Admitting: Internal Medicine

## 2014-09-14 ENCOUNTER — Ambulatory Visit (HOSPITAL_COMMUNITY): Payer: Managed Care, Other (non HMO) | Attending: Cardiology | Admitting: Cardiology

## 2014-09-14 DIAGNOSIS — I447 Left bundle-branch block, unspecified: Secondary | ICD-10-CM | POA: Diagnosis not present

## 2014-09-14 NOTE — Progress Notes (Signed)
Echo performed. 

## 2014-09-29 ENCOUNTER — Encounter (INDEPENDENT_AMBULATORY_CARE_PROVIDER_SITE_OTHER): Payer: Self-pay | Admitting: *Deleted

## 2014-09-29 ENCOUNTER — Other Ambulatory Visit (INDEPENDENT_AMBULATORY_CARE_PROVIDER_SITE_OTHER): Payer: Self-pay | Admitting: Internal Medicine

## 2014-09-29 DIAGNOSIS — Q438 Other specified congenital malformations of intestine: Secondary | ICD-10-CM

## 2014-10-05 ENCOUNTER — Encounter (HOSPITAL_COMMUNITY): Payer: Self-pay | Admitting: Internal Medicine

## 2014-10-14 ENCOUNTER — Other Ambulatory Visit (HOSPITAL_COMMUNITY): Payer: Managed Care, Other (non HMO)

## 2014-10-16 ENCOUNTER — Ambulatory Visit (HOSPITAL_COMMUNITY): Payer: Managed Care, Other (non HMO) | Attending: Internal Medicine | Admitting: Radiology

## 2014-10-16 VITALS — BP 107/71 | Ht 61.5 in | Wt 140.0 lb

## 2014-10-16 DIAGNOSIS — I447 Left bundle-branch block, unspecified: Secondary | ICD-10-CM

## 2014-10-16 DIAGNOSIS — R9431 Abnormal electrocardiogram [ECG] [EKG]: Secondary | ICD-10-CM | POA: Insufficient documentation

## 2014-10-16 MED ORDER — TECHNETIUM TC 99M SESTAMIBI GENERIC - CARDIOLITE
33.0000 | Freq: Once | INTRAVENOUS | Status: AC | PRN
  Administered 2014-10-16: 33 via INTRAVENOUS

## 2014-10-16 MED ORDER — TECHNETIUM TC 99M SESTAMIBI GENERIC - CARDIOLITE
11.0000 | Freq: Once | INTRAVENOUS | Status: AC | PRN
Start: 2014-10-16 — End: 2014-10-16
  Administered 2014-10-16: 11 via INTRAVENOUS

## 2014-10-16 MED ORDER — ADENOSINE (DIAGNOSTIC) 3 MG/ML IV SOLN
0.5600 mg/kg | Freq: Once | INTRAVENOUS | Status: AC
  Administered 2014-10-16: 35.7 mg via INTRAVENOUS

## 2014-10-16 NOTE — Progress Notes (Signed)
[facility] [address] [place], [place] [address] [phone]    Cardiology Nuclear Med Study  GLEMA TAKAKI is a 48 y.o. female     MRN : [id number]     DOB: [birth year]  Procedure Date: 10/16/2014  Nuclear Med Background Indication for Stress Test:  Evaluation for Ischemia and Abnormal EKG History:  New LBBB Cardiac Risk Factors: LBBB  Symptoms:  N/A   Nuclear Pre-Procedure Caffeine/Decaff Intake:  None NPO After: 9:00am   Lungs:  clear O2 Sat: 96% on room air. IV 0.9% NS with Angio Cath:  22g  IV Site: L Antecubital  IV Started by:  Crissie Figures, RN  Chest Size (in):  34 Cup Size: C  Height: 5' 1.5" (1.562 m)  Weight:  140 lb (63.504 kg)  BMI:  Body mass index is 26.03 kg/(m^2). Tech Comments:  N/A    Nuclear Med Study 1 or 2 day study: 1 day  Stress Test Type:  Adenosine  Reading MD: N/A  Order Authorizing Provider:  Lavone Orn, MD  Resting Radionuclide: Technetium 64m Sestamibi  Resting Radionuclide Dose: 11.0 mCi   Stress Radionuclide:  Technetium 1m Sestamibi  Stress Radionuclide Dose: 33.0 mCi           Stress Protocol Rest HR: 75 Stress HR: 97  Rest BP: 107/71 Stress BP: 90/54  Exercise Time (min): n/a METS: n/a   Predicted Max HR: 170 bpm % Max HR: 57.06 bpm Rate Pressure Product: 9797   Dose of Adenosine (mg):  35.6mg  Dose of Lexiscan: n/a mg  Dose of Atropine (mg): n/a Dose of Dobutamine: n/a mcg/kg/min (at max HR)  Stress Test Technologist: Crissie Figures, RN  Nuclear Technologist:  Earl Many, CNMT     Rest Procedure:  Myocardial perfusion imaging was performed at rest 45 minutes following the intravenous administration of Technetium 82m Sestamibi. Rest ECG: SR  LBBB  Stress Procedure:  The patient received IV adenosine at 140 mcg/kg/min for 4 minutes.  Technetium 48m Sestamibi was injected at the 2 minute mark and quantitative spect images were obtained after a 45 minute delay. Stress ECG: Nondiagnostic  due to baseline changes    QPS Raw Data Images:  Soft tissue (diaphragm, bowel activity, breast) surround heart Stress Images:   Moderate defect in the inferoseptal wall (base, mid, distal), inferior base and apex  Rest Images:  Comparison with the stress images reveals no significant change. Subtraction (SDS):  No evidence of ischemia. Transient Ischemic Dilatation (Normal <1.22):  0.96 Lung/Heart Ratio (Normal <0.45):  0.25  Quantitative Gated Spect Images QGS EDV:  86 ml QGS ESV:  38 ml  Impression Exercise Capacity:  Adenosine study with no exercise. BP Response:  Normal blood pressure response. Clinical Symptoms:  Mild chest pain/dyspnea. ECG Impression: Nondiagnostic   Comparison with Prior Nuclear Study: No images to compare  Overall Impression:  Inferior/inferoseptal defect consistent with probable soft tissue attenuation, cannot exclude subendocardial scar.  No ischemia.  Low risk scan  LV Ejection Fraction: 56%.  LV Wall Motion:  Normal wall thickening    Laura [name]

## 2014-11-06 ENCOUNTER — Ambulatory Visit (HOSPITAL_COMMUNITY)
Admission: RE | Admit: 2014-11-06 | Discharge: 2014-11-06 | Disposition: A | Discharge status: Home / Self Care | Payer: Managed Care, Other (non HMO) | Source: Ambulatory Visit | Attending: Internal Medicine | Admitting: Internal Medicine

## 2014-11-06 ENCOUNTER — Other Ambulatory Visit (HOSPITAL_COMMUNITY): Payer: Managed Care, Other (non HMO)

## 2014-11-06 DIAGNOSIS — Q438 Other specified congenital malformations of intestine: Secondary | ICD-10-CM | POA: Diagnosis present

## 2014-11-24 ENCOUNTER — Telehealth (INDEPENDENT_AMBULATORY_CARE_PROVIDER_SITE_OTHER): Payer: Self-pay | Admitting: *Deleted

## 2014-11-24 NOTE — Telephone Encounter (Signed)
TCS op note stated repeat TCS in 5 yrs under fluoro -- your recommendation on BE is repeat TCS in 10 -- which is correct -- please advise

## 2014-12-07 NOTE — Telephone Encounter (Signed)
She does not have first-degree relative with CRC. Next screening would be in 10 years. He has very redundant and tortuous colon based on barium enema. Therefore she will need worship colonoscopy or other screening method and not optical colonoscopy.

## 2014-12-08 NOTE — Telephone Encounter (Signed)
10 yr TCS noted

## 2015-04-02 ENCOUNTER — Other Ambulatory Visit: Payer: Self-pay | Admitting: Gynecology

## 2015-04-02 DIAGNOSIS — Z1231 Encounter for screening mammogram for malignant neoplasm of breast: Secondary | ICD-10-CM

## 2015-04-21 ENCOUNTER — Other Ambulatory Visit: Payer: Self-pay | Admitting: Gynecology

## 2015-04-21 ENCOUNTER — Ambulatory Visit (HOSPITAL_COMMUNITY)
Admission: RE | Admit: 2015-04-21 | Discharge: 2015-04-21 | Disposition: A | Discharge status: Home / Self Care | Payer: Managed Care, Other (non HMO) | Source: Ambulatory Visit | Attending: Gynecology | Admitting: Gynecology

## 2015-04-21 DIAGNOSIS — Z1231 Encounter for screening mammogram for malignant neoplasm of breast: Secondary | ICD-10-CM | POA: Insufficient documentation

## 2015-04-23 ENCOUNTER — Other Ambulatory Visit: Payer: Self-pay | Admitting: Gynecology

## 2015-04-23 DIAGNOSIS — R928 Other abnormal and inconclusive findings on diagnostic imaging of breast: Secondary | ICD-10-CM

## 2015-04-28 ENCOUNTER — Other Ambulatory Visit: Payer: Self-pay | Admitting: Gynecology

## 2015-04-28 ENCOUNTER — Other Ambulatory Visit: Payer: Self-pay

## 2015-04-28 DIAGNOSIS — R928 Other abnormal and inconclusive findings on diagnostic imaging of breast: Secondary | ICD-10-CM

## 2015-04-29 ENCOUNTER — Ambulatory Visit
Admission: RE | Admit: 2015-04-29 | Discharge: 2015-04-29 | Disposition: A | Discharge status: Home / Self Care | Payer: Managed Care, Other (non HMO) | Source: Ambulatory Visit | Attending: Gynecology | Admitting: Gynecology

## 2015-04-29 DIAGNOSIS — R928 Other abnormal and inconclusive findings on diagnostic imaging of breast: Secondary | ICD-10-CM

## 2015-08-05 ENCOUNTER — Ambulatory Visit (INDEPENDENT_AMBULATORY_CARE_PROVIDER_SITE_OTHER): Payer: Managed Care, Other (non HMO) | Admitting: Gynecology

## 2015-08-05 ENCOUNTER — Encounter: Payer: Self-pay | Admitting: Gynecology

## 2015-08-05 VITALS — BP 114/64 | Ht 62.0 in | Wt 134.0 lb

## 2015-08-05 DIAGNOSIS — Z01419 Encounter for gynecological examination (general) (routine) without abnormal findings: Secondary | ICD-10-CM

## 2015-08-05 NOTE — Patient Instructions (Signed)

## 2015-08-05 NOTE — Progress Notes (Signed)
Laura [name] 04-20-1966 [id number]        49 y.o.  [id number] for annual exam.  Doing well without complaints  Past medical history,surgical history, problem list, medications, allergies, family history and social history were all reviewed and documented as reviewed in the EPIC chart.  ROS:  Performed with pertinent positives and negatives included in the history, assessment and plan.   Additional significant findings :  none   Exam: Laura [name] Vitals:   08/05/15 1027  BP: 114/64  Height: 5\' 2"  (1.575 m)  Weight: 134 lb (60.782 kg)   General appearance:  Normal affect, orientation and appearance. Skin: Grossly normal HEENT: Without gross lesions.  No cervical or supraclavicular adenopathy. Thyroid normal.  Lungs:  Clear without wheezing, rales or rhonchi Cardiac: RR, without RMG Abdominal:  Soft, nontender, without masses, guarding, rebound, organomegaly or hernia Breasts:  Examined lying and sitting without masses, retractions, discharge or axillary adenopathy. Pelvic:  Ext/BUS/vagina normal  Adnexa  Without masses or tenderness    Anus and perineum  Normal   Rectovaginal  Normal sphincter tone without palpated masses or tenderness.    Assessment/Plan:  49 y.o. [id number] female for annual exam.   1. Status post TAH 2011 for leiomyoma. Having some hot flashes which she is tolerating.  Does not want intervention such as HRT. Will follow up it becomes more of an issue wants to rediscuss her options. 2. Pap smear 2014. No Pap smear done today. History of ASCUS negative colposcopy 2004. Status post hysterectomy for benign indications. Options to stop screening altogether versus less frequent screening intervals reviewed. Will readdress on an annual basis. 3. Mammography 2016. Continue with annual mammography when due. This being of the reviewed. 4. Colonoscopy 2015. Repeat at their recommended interval. 5. DEXA never. Plan further into the menopause. Increase calcium vitamin  D reviewed. 6. Health maintenance. No routine blood work done as she reports this done at her primary physician's office. Follow up 1 year, sooner as needed.   Laura Auerbach MD, 10:56 AM 08/05/2015

## 2015-08-06 LAB — URINALYSIS W MICROSCOPIC + REFLEX CULTURE
Bacteria, UA: NONE SEEN [HPF]
Bilirubin Urine: NEGATIVE
CASTS: NONE SEEN [LPF]
CRYSTALS: NONE SEEN [HPF]
Glucose, UA: NEGATIVE
Hgb urine dipstick: NEGATIVE
Ketones, ur: NEGATIVE
NITRITE: NEGATIVE
PH: 6 (ref 5.0–8.0)
Protein, ur: NEGATIVE
RBC / HPF: NONE SEEN RBC/HPF (ref <=2)
Specific Gravity, Urine: 1.019 (ref 1.001–1.035)
YEAST: NONE SEEN [HPF]

## 2015-08-08 LAB — URINE CULTURE: Colony Count: 85000

## 2015-08-10 ENCOUNTER — Other Ambulatory Visit: Payer: Self-pay | Admitting: Gynecology

## 2015-08-10 MED ORDER — SULFAMETHOXAZOLE-TRIMETHOPRIM 800-160 MG PO TABS
1.0000 | ORAL_TABLET | Freq: Two times a day (BID) | ORAL | Status: DC
Start: 2015-08-10 — End: 2016-08-09

## 2016-03-27 ENCOUNTER — Other Ambulatory Visit: Payer: Self-pay

## 2016-03-27 DIAGNOSIS — Z1231 Encounter for screening mammogram for malignant neoplasm of breast: Secondary | ICD-10-CM

## 2016-04-24 ENCOUNTER — Ambulatory Visit
Admission: RE | Admit: 2016-04-24 | Discharge: 2016-04-24 | Disposition: A | Discharge status: Home / Self Care | Payer: Managed Care, Other (non HMO) | Source: Ambulatory Visit

## 2016-04-24 DIAGNOSIS — Z1231 Encounter for screening mammogram for malignant neoplasm of breast: Secondary | ICD-10-CM

## 2016-08-09 ENCOUNTER — Ambulatory Visit (INDEPENDENT_AMBULATORY_CARE_PROVIDER_SITE_OTHER): Payer: Managed Care, Other (non HMO) | Admitting: Gynecology

## 2016-08-09 ENCOUNTER — Encounter: Payer: Self-pay | Admitting: Gynecology

## 2016-08-09 VITALS — BP 118/74 | Ht 62.0 in | Wt 139.0 lb

## 2016-08-09 DIAGNOSIS — Z01419 Encounter for gynecological examination (general) (routine) without abnormal findings: Secondary | ICD-10-CM | POA: Diagnosis not present

## 2016-08-09 DIAGNOSIS — N898 Other specified noninflammatory disorders of vagina: Secondary | ICD-10-CM

## 2016-08-09 DIAGNOSIS — N951 Menopausal and female climacteric states: Secondary | ICD-10-CM | POA: Diagnosis not present

## 2016-08-09 NOTE — Patient Instructions (Signed)

## 2016-08-09 NOTE — Addendum Note (Signed)
Addended by: Nelva Nay on: 08/09/2016 11:42 AM   Modules accepted: Orders

## 2016-08-09 NOTE — Progress Notes (Signed)
    Laura [name] [birth year] [id number]        50 y.o.  [id number]  for annual exam.  Complaining of menopausal symptoms as discussed below.  Past medical history,surgical history, problem list, medications, allergies, family history and social history were all reviewed and documented as reviewed in the EPIC chart.  ROS:  Performed with pertinent positives and negatives included in the history, assessment and plan.   Additional significant findings :  None   Exam: Laura [name] assistant Vitals:   08/09/16 1059  BP: 118/74  Weight: 139 lb (63 kg)  Height: 5\' 2"  (1.575 m)   Body mass index is 25.42 kg/m.  General appearance:  Normal affect, orientation and appearance. Skin: Grossly normal HEENT: Without gross lesions.  No cervical or supraclavicular adenopathy. Thyroid normal.  Lungs:  Clear without wheezing, rales or rhonchi Cardiac: RR, without RMG Abdominal:  Soft, nontender, without masses, guarding, rebound, organomegaly or hernia Breasts:  Examined lying and sitting without masses, retractions, discharge or axillary adenopathy. Pelvic:  Ext/BUS/Vagina normal  Adnexa without masses or tenderness    Anus and perineum normal   Rectovaginal normal sphincter tone without palpated masses or tenderness.    Assessment/Plan:  50 y.o. [id number] female for annual exam.   1. Menopausal symptoms/vaginal dryness. Patient notes some worsening hot flushes and night sweats as well as vaginal dryness acquiring lubrication with intercourse. Status post TAH 2011 for leiomyoma. Reviewed options with her to include OTC products such as lubricants/moisturizers and soy based products for the global symptoms. I reviewed HRT with her to include the latest 2017 NAMS guidelines with benefits of symptom relief possible early initiation benefits cardiovascular and bone health as well as risks to include thrombosis and breast cancer issues. At this point the patient does not feel that her symptoms warrant  HRT. She prefers to monitor for now but may call if she decides she wants to initiate. 2. Pap smear 2014. Pap smear done today. History of ASCUS with negative colposcopy 2004. Normal Pap smears otherwise. Status post hysterectomy for benign indications. Options to stop screening versus less frequent screening intervals reviewed per current screening guidelines. Will readdress on annual basis. 3. Mammography 04/2016. Continue with annual mammography when due. SBE monthly reviewed. 4. Colonoscopy 2015. Repeat at their recommended interval. 5. DEXA never. Will plan further into the menopause. Increased calcium vitamin D. 6. Health maintenance. Patient reports routine lab work done elsewhere. Follow up in one year, sooner as needed.  10 minutes of my time in excess of her routine gynecologic exam was spent in direct face to face counseling and coordination of care in regards to her problems of menopausal symptoms and vaginal dryness.    Anastasio Auerbach MD, 11:29 AM 08/09/2016

## 2016-08-10 LAB — PAP IG W/ RFLX HPV ASCU

## 2017-04-04 ENCOUNTER — Other Ambulatory Visit: Payer: Self-pay | Admitting: Gynecology

## 2017-04-04 DIAGNOSIS — Z1231 Encounter for screening mammogram for malignant neoplasm of breast: Secondary | ICD-10-CM

## 2017-05-02 ENCOUNTER — Ambulatory Visit
Admission: RE | Admit: 2017-05-02 | Discharge: 2017-05-02 | Disposition: A | Discharge status: Home / Self Care | Payer: 59 | Source: Ambulatory Visit | Attending: Gynecology | Admitting: Gynecology

## 2017-05-02 DIAGNOSIS — Z1231 Encounter for screening mammogram for malignant neoplasm of breast: Secondary | ICD-10-CM

## 2017-07-03 DIAGNOSIS — R635 Abnormal weight gain: Secondary | ICD-10-CM | POA: Diagnosis not present

## 2017-07-03 DIAGNOSIS — Z6828 Body mass index (BMI) 28.0-28.9, adult: Secondary | ICD-10-CM | POA: Diagnosis not present

## 2017-07-03 DIAGNOSIS — R5383 Other fatigue: Secondary | ICD-10-CM | POA: Diagnosis not present

## 2017-08-14 ENCOUNTER — Encounter: Payer: 59 | Attending: Internal Medicine | Admitting: Registered"

## 2017-08-14 ENCOUNTER — Encounter: Payer: Self-pay | Admitting: Registered"

## 2017-08-14 DIAGNOSIS — E663 Overweight: Secondary | ICD-10-CM | POA: Diagnosis not present

## 2017-08-14 DIAGNOSIS — Z713 Dietary counseling and surveillance: Secondary | ICD-10-CM | POA: Insufficient documentation

## 2017-08-14 NOTE — Progress Notes (Signed)
Medical Nutrition Therapy:  Appt start time: 6269 end time:  1630.  Assessment:  Primary concerns today: Pt states she has gained more than 10 lb since last year and has reached a plateau even with participating in boot camp and intense cardio classes, weight training, and ab workout. Pt uses my fitness pal to track calories and stays in 1200 calorie range.  Vitamin/mineral assessment: Pt states she also has fatigue issues and her OB recommended taking D3, but pt reports she has not had lab test done. Pt states she takes iron supplement because of history of anemia. Pt states she takes OTC B12 due to fatigue, states she has not had a lab test.  Sleep: not restful, has been having night sweats for 3 years. (postmenopausal ~9 yrs)  Preferred Learning Style:   No preference indicated   Learning Readiness:   Contemplating  MEDICATIONS: reviewed   DIETARY INTAKE:  24-hr recall:  uses my fitness pal with 1200 kcal goal = 40% pro, 30% cho, 30% fat  B ( AM): 2 egg white, 1 slice ww bread, coffee 2 tsp sugar  Snk ( AM): grapefruit & apple or banana w/ PB  L ( PM): grilled chicken salad, FF italian dressing OR chicken, brown rice Snk ( PM): tuna pack, 6 trisket OR cottage cheese with protein powder D ( PM): grilled chicken breast (eat in car on way home), if home green vegetable, sometimes sweet potato Snk ( PM): none Beverages: water, green tea  Usual physical activity: boot camp 2x week, 2x gym 60 min cardio, 300 abs, weight training, additional days cardio at gym. (RD estimates what she is describing works out to ~7 hrs intense workout/week)  Estimated energy needs:  1800 calories 200 g carbohydrates 113 g protein 60 g fat  Progress Towards Goal(s):  In progress.   Nutritional Diagnosis:  NB-2.2 Excessive physical activity As related to intense workouts, low calorie diet, low energy.  As evidenced by diet recall, 1200 cal diet, ~7 hrs intense exercise/week, reported fatigue.     Intervention:  Nutrition Education. Discussed complexities of weight management. Discussed metabolism shift with low calorie diet. Discussed the role of vitamin/mineral deficiency in energy level and health.  Plan: A few labs may be helpful to understand fatigue. Vit B12, Vit D and Iron  Consider cutting back your exercise on Tues/Thurs to have either boot camp or cardio. Other days at the gym 45 min on elliptical should be plenty. Consider having more healthy fats and complex carbs in your diet. Consider have a few egg yolks during the week.  Teaching Method Utilized:  Visual Auditory  Handouts given during visit include:  none  Barriers to learning/adherence to lifestyle change: does not want to gain weight  Demonstrated degree of understanding via:  Teach Back   Monitoring/Evaluation:  Dietary intake, exercise, sleep/night sweats, and body weight in 3 week(s).

## 2017-08-14 NOTE — Patient Instructions (Addendum)
A few labs may be helpful to understand fatigue. Vit B12, Vit D and Iron  Consider cutting back your exercise on Tues/Thurs to have either boot camp or cardio. Other days at the gym 45 min on elliptical should be plenty. Consider having more healthy fats and complex carbs in your diet. Consider have a few egg yolks during the week.

## 2017-09-04 ENCOUNTER — Ambulatory Visit: Payer: 59 | Admitting: Registered"

## 2017-10-16 ENCOUNTER — Encounter: Payer: Self-pay | Admitting: Gynecology

## 2017-10-16 ENCOUNTER — Ambulatory Visit (INDEPENDENT_AMBULATORY_CARE_PROVIDER_SITE_OTHER): Payer: 59 | Admitting: Gynecology

## 2017-10-16 VITALS — BP 114/70 | Ht 62.0 in | Wt 152.0 lb

## 2017-10-16 DIAGNOSIS — N952 Postmenopausal atrophic vaginitis: Secondary | ICD-10-CM | POA: Diagnosis not present

## 2017-10-16 DIAGNOSIS — Z1329 Encounter for screening for other suspected endocrine disorder: Secondary | ICD-10-CM | POA: Diagnosis not present

## 2017-10-16 DIAGNOSIS — Z01411 Encounter for gynecological examination (general) (routine) with abnormal findings: Secondary | ICD-10-CM | POA: Diagnosis not present

## 2017-10-16 DIAGNOSIS — Z13220 Encounter for screening for lipoid disorders: Secondary | ICD-10-CM

## 2017-10-16 LAB — CBC WITH DIFFERENTIAL/PLATELET
BASOS ABS: 38 {cells}/uL (ref 0–200)
Basophils Relative: 0.8 %
EOS ABS: 62 {cells}/uL (ref 15–500)
EOS PCT: 1.3 %
HCT: 35.6 % (ref 35.0–45.0)
HEMOGLOBIN: 12.1 g/dL (ref 11.7–15.5)
Lymphs Abs: 2035 cells/uL (ref 850–3900)
MCH: 29.7 pg (ref 27.0–33.0)
MCHC: 34 g/dL (ref 32.0–36.0)
MCV: 87.3 fL (ref 80.0–100.0)
MONOS PCT: 7.3 %
MPV: 11.7 fL (ref 7.5–12.5)
NEUTROS PCT: 48.2 %
Neutro Abs: 2314 cells/uL (ref 1500–7800)
PLATELETS: 257 10*3/uL (ref 140–400)
RBC: 4.08 10*6/uL (ref 3.80–5.10)
RDW: 11.6 % (ref 11.0–15.0)
TOTAL LYMPHOCYTE: 42.4 %
WBC mixed population: 350 cells/uL (ref 200–950)
WBC: 4.8 10*3/uL (ref 3.8–10.8)

## 2017-10-16 LAB — LIPID PANEL
CHOL/HDL RATIO: 3.6 (calc) (ref <5.0)
CHOLESTEROL: 228 mg/dL — AB (ref <200)
HDL: 64 mg/dL (ref >50)
LDL Cholesterol (Calc): 150 mg/dL (calc) — ABNORMAL HIGH
Non-HDL Cholesterol (Calc): 164 mg/dL (calc) — ABNORMAL HIGH (ref <130)
Triglycerides: 50 mg/dL (ref <150)

## 2017-10-16 LAB — TSH: TSH: 1.41 mIU/L

## 2017-10-16 NOTE — Patient Instructions (Signed)
Follow-up in 1 year, sooner as needed. 

## 2017-10-16 NOTE — Progress Notes (Signed)
    Laura [name] [birth year] [id number]        51 y.o.  [id number] for annual gynecologic exam.  Doing well without complaints  Past medical history,surgical history, problem list, medications, allergies, family history and social history were all reviewed and documented as reviewed in the EPIC chart.  ROS:  Performed with pertinent positives and negatives included in the history, assessment and plan.   Additional significant findings : None   Exam: [name] assistant Vitals:   10/16/17 1612  BP: 114/70  Weight: 152 lb (68.9 kg)  Height: 5\' 2"  (1.575 m)   Body mass index is 27.8 kg/m.  General appearance:  Normal affect, orientation and appearance. Skin: Grossly normal HEENT: Without gross lesions.  No cervical or supraclavicular adenopathy. Thyroid normal.  Lungs:  Clear without wheezing, rales or rhonchi Cardiac: RR, without RMG Abdominal:  Soft, nontender, without masses, guarding, rebound, organomegaly or hernia Breasts:  Examined lying and sitting without masses, retractions, discharge or axillary adenopathy. Pelvic:  Ext, BUS, Vagina: With mild atrophic changes  Adnexa: Without masses or tenderness    Anus and perineum: Normal   Rectovaginal: Normal sphincter tone without palpated masses or tenderness.    Assessment/Plan:  50 y.o. [id number] female for annual gynecologic exam status post TAH 2011 for leiomyoma.   1. Postmenopausal/mild atrophic genital changes.  Doing well without significant hot flushes, night sweats, vaginal dryness.  Continue to monitor and report any issues. 2. Pap smear 2017.  No Pap smear done today.  History of ASCUS with negative colposcopy 2004 with normal Pap smears otherwise.  Options to stop screening per current screening guidelines based on hysterectomy history reviewed.  Will readdress on an annual basis. 3. Mammography 04/2017.  Continue with annual mammography when due.  Breast exam normal today.  SBE monthly reviewed. 4. Colonoscopy  2015.  Repeat at their recommended interval. 5. DEXA never.  Will plan further into the menopause. 6. Health maintenance.  Patient requests baseline labs.  CBC, lipid profile, TSH, urinalysis ordered.  Comprehensive metabolic panel through her primary physician's office normal in July.  Follow-up in 1 year, sooner as needed.   Anastasio Auerbach MD, 4:45 PM 10/16/2017

## 2017-10-17 ENCOUNTER — Other Ambulatory Visit: Payer: Self-pay | Admitting: Gynecology

## 2017-10-17 DIAGNOSIS — E7800 Pure hypercholesterolemia, unspecified: Secondary | ICD-10-CM

## 2017-10-19 LAB — URINALYSIS W MICROSCOPIC + REFLEX CULTURE
Bacteria, UA: NONE SEEN /HPF
Bilirubin Urine: NEGATIVE
GLUCOSE, UA: NEGATIVE
HYALINE CAST: NONE SEEN /LPF
Hgb urine dipstick: NEGATIVE
Ketones, ur: NEGATIVE
Nitrites, Initial: NEGATIVE
PROTEIN: NEGATIVE
RBC / HPF: NONE SEEN /HPF (ref 0–2)
SPECIFIC GRAVITY, URINE: 1.022 (ref 1.001–1.03)
pH: 6.5 (ref 5.0–8.0)

## 2017-10-19 LAB — URINE CULTURE
MICRO NUMBER: 81283454
SPECIMEN QUALITY:: ADEQUATE

## 2017-10-19 LAB — CULTURE INDICATED

## 2017-10-24 ENCOUNTER — Encounter: Payer: Managed Care, Other (non HMO) | Admitting: Gynecology

## 2017-11-07 ENCOUNTER — Other Ambulatory Visit: Payer: 59

## 2017-11-07 DIAGNOSIS — E7800 Pure hypercholesterolemia, unspecified: Secondary | ICD-10-CM | POA: Diagnosis not present

## 2017-11-07 LAB — LIPID PANEL
Cholesterol: 256 mg/dL — ABNORMAL HIGH (ref <200)
HDL: 78 mg/dL (ref >50)
LDL Cholesterol (Calc): 164 mg/dL (calc) — ABNORMAL HIGH
NON-HDL CHOLESTEROL (CALC): 178 mg/dL — AB (ref <130)
Total CHOL/HDL Ratio: 3.3 (calc) (ref <5.0)
Triglycerides: 45 mg/dL (ref <150)

## 2017-11-08 ENCOUNTER — Telehealth: Payer: Self-pay

## 2017-11-08 NOTE — Telephone Encounter (Signed)
Patient called about her FLP results being high. I let herk now I had 10 minutes ago emailed her in My Chart Dr. Dorette Grate recommendation. I read it to her on the phone. She will contact her PCP for follow up.

## 2017-11-30 ENCOUNTER — Telehealth: Payer: Self-pay | Admitting: *Deleted

## 2017-11-30 NOTE — Telephone Encounter (Signed)
Patient called and left message in triage voicemail c/o pain with intercourse, I called pt back and received her voicemail, I left on vm , if pain with intercourse OV is needed to call appointment desk to schedule.

## 2018-03-22 DIAGNOSIS — H5213 Myopia, bilateral: Secondary | ICD-10-CM | POA: Diagnosis not present

## 2018-03-22 DIAGNOSIS — H524 Presbyopia: Secondary | ICD-10-CM | POA: Diagnosis not present

## 2018-04-09 ENCOUNTER — Other Ambulatory Visit: Payer: Self-pay | Admitting: Gynecology

## 2018-04-09 DIAGNOSIS — Z1231 Encounter for screening mammogram for malignant neoplasm of breast: Secondary | ICD-10-CM

## 2018-04-09 DIAGNOSIS — E7800 Pure hypercholesterolemia, unspecified: Secondary | ICD-10-CM | POA: Diagnosis not present

## 2018-04-09 DIAGNOSIS — Z0000 Encounter for general adult medical examination without abnormal findings: Secondary | ICD-10-CM | POA: Diagnosis not present

## 2018-05-07 ENCOUNTER — Ambulatory Visit
Admission: RE | Admit: 2018-05-07 | Discharge: 2018-05-07 | Disposition: A | Discharge status: Home / Self Care | Payer: 59 | Source: Ambulatory Visit | Attending: Gynecology | Admitting: Gynecology

## 2018-05-07 DIAGNOSIS — Z1231 Encounter for screening mammogram for malignant neoplasm of breast: Secondary | ICD-10-CM

## 2018-06-17 DIAGNOSIS — Z13220 Encounter for screening for lipoid disorders: Secondary | ICD-10-CM | POA: Diagnosis not present

## 2018-06-17 DIAGNOSIS — R6882 Decreased libido: Secondary | ICD-10-CM | POA: Diagnosis not present

## 2018-06-17 DIAGNOSIS — R5383 Other fatigue: Secondary | ICD-10-CM | POA: Diagnosis not present

## 2018-06-17 DIAGNOSIS — Z131 Encounter for screening for diabetes mellitus: Secondary | ICD-10-CM | POA: Diagnosis not present

## 2018-06-17 DIAGNOSIS — N951 Menopausal and female climacteric states: Secondary | ICD-10-CM | POA: Diagnosis not present

## 2018-06-17 DIAGNOSIS — E559 Vitamin D deficiency, unspecified: Secondary | ICD-10-CM | POA: Diagnosis not present

## 2018-06-17 DIAGNOSIS — R635 Abnormal weight gain: Secondary | ICD-10-CM | POA: Diagnosis not present

## 2018-07-10 DIAGNOSIS — R5383 Other fatigue: Secondary | ICD-10-CM | POA: Diagnosis not present

## 2018-07-10 DIAGNOSIS — R6882 Decreased libido: Secondary | ICD-10-CM | POA: Diagnosis not present

## 2018-07-10 DIAGNOSIS — N951 Menopausal and female climacteric states: Secondary | ICD-10-CM | POA: Diagnosis not present

## 2018-07-10 DIAGNOSIS — R635 Abnormal weight gain: Secondary | ICD-10-CM | POA: Diagnosis not present

## 2018-08-01 DIAGNOSIS — N951 Menopausal and female climacteric states: Secondary | ICD-10-CM | POA: Diagnosis not present

## 2018-08-01 DIAGNOSIS — R6882 Decreased libido: Secondary | ICD-10-CM | POA: Diagnosis not present

## 2018-08-01 DIAGNOSIS — R635 Abnormal weight gain: Secondary | ICD-10-CM | POA: Diagnosis not present

## 2018-08-01 DIAGNOSIS — R5383 Other fatigue: Secondary | ICD-10-CM | POA: Diagnosis not present

## 2018-10-17 ENCOUNTER — Ambulatory Visit (INDEPENDENT_AMBULATORY_CARE_PROVIDER_SITE_OTHER): Payer: 59 | Admitting: Gynecology

## 2018-10-17 ENCOUNTER — Encounter: Payer: Self-pay | Admitting: Gynecology

## 2018-10-17 VITALS — BP 118/76 | Ht 62.0 in | Wt 152.0 lb

## 2018-10-17 DIAGNOSIS — N941 Unspecified dyspareunia: Secondary | ICD-10-CM | POA: Diagnosis not present

## 2018-10-17 DIAGNOSIS — Z01419 Encounter for gynecological examination (general) (routine) without abnormal findings: Secondary | ICD-10-CM | POA: Diagnosis not present

## 2018-10-17 DIAGNOSIS — Z79890 Hormone replacement therapy (postmenopausal): Secondary | ICD-10-CM

## 2018-10-17 DIAGNOSIS — N952 Postmenopausal atrophic vaginitis: Secondary | ICD-10-CM

## 2018-10-17 NOTE — Progress Notes (Signed)
Laura [name] 03/10/1966 [id number]        52 y.o.  [id number] for annual gynecologic exam.  Recently saw her primary physician for health check and was given a prescription for estradiol, Prometrium and testosterone.  Patient relates that she is not having any significant hot flushes or sweats.  She notes some mild decrease in libido.  Also some vaginal dryness with intercourse.  Not having daily vaginal dryness or vaginal symptoms.  She has not started on the medications but wanted my input.  Past medical history,surgical history, problem list, medications, allergies, family history and social history were all reviewed and documented as reviewed in the EPIC chart.  ROS:  Performed with pertinent positives and negatives included in the history, assessment and plan.   Additional significant findings : None   Exam: Laura [name] assistant Vitals:   10/17/18 1552  BP: 118/76  Weight: 152 lb (68.9 kg)  Height: 5\' 2"  (1.575 m)   Body mass index is 27.8 kg/m.  General appearance:  Normal affect, orientation and appearance. Skin: Grossly normal HEENT: Without gross lesions.  No cervical or supraclavicular adenopathy. Thyroid normal.  Lungs:  Clear without wheezing, rales or rhonchi Cardiac: RR, without RMG Abdominal:  Soft, nontender, without masses, guarding, rebound, organomegaly or hernia Breasts:  Examined lying and sitting without masses, retractions, discharge or axillary adenopathy. Pelvic:  Ext, BUS, Vagina: Normal with mild atrophic changes  Adnexa: Without masses or tenderness    Anus and perineum: Normal   Rectovaginal: Normal sphincter tone without palpated masses or tenderness.    Assessment/Plan:  52 y.o. [id number] female for annual gynecologic exam status post TAH 2011 for leiomyoma.   1. Postmenopausal.  Is having some vaginal dryness with intercourse but otherwise doing well without significant hot flushes, sweats or sleep disturbances.  Also has some decreased  libido.  I reviewed HRT with her in detail to include various studies.  Risks versus benefits were reviewed.  ACOG and [facility] policy statements discussed.  Stroke heart attack DVT in the breast cancer issue versus benefits of symptom relief and possible cardiovascular and bone health discussed.  The issues with testosterone to include possible libido benefit versus risks to include adverse lipid profile weight hair growth and acne discussed.  Also reviewed response varies from patient the patient.  I discussed with her hysterectomy history that the progesterone is not necessary and that if I would consider HRT I would start with estradiol alone and see how she responds both from a vaginal standpoint overall feeling of well-being and libido.  Could add back testosterone 2% cream as needed.  At this point the patient is not interested in starting HRT as she is not significantly symptomatic.  I discussed OTC lubricants to help with vaginal dryness during intercourse.  She will follow-up with me for any further discussion referring to her hormone replacement therapy. 2. Colonoscopy 2015.  Repeat at their recommended interval. 3. Mammography 05/2018.  Continue with annual mammography when due.  Breast exam normal today. 4. Pap smear 2017.  No Pap smear done today.  No history of significant abnormal Pap smears.  Options to stop screening per current screening guidelines versus less frequent screening intervals reviewed.  Will readdress on an annual basis. 5. DEXA never.  Will plan further into the menopause. 6. Health maintenance.  No routine lab work done as patient reports that done elsewhere.  Follow-up 1 year, sooner as needed.   Anastasio Auerbach MD, 4:28 PM 10/17/2018

## 2018-10-17 NOTE — Patient Instructions (Signed)
Follow-up in 1 year for annual exam, sooner if any issues. 

## 2018-11-06 DIAGNOSIS — E785 Hyperlipidemia, unspecified: Secondary | ICD-10-CM | POA: Diagnosis not present

## 2018-11-06 DIAGNOSIS — N951 Menopausal and female climacteric states: Secondary | ICD-10-CM | POA: Diagnosis not present

## 2018-11-18 DIAGNOSIS — E7800 Pure hypercholesterolemia, unspecified: Secondary | ICD-10-CM | POA: Diagnosis not present

## 2019-04-04 ENCOUNTER — Other Ambulatory Visit: Payer: Self-pay | Admitting: Gynecology

## 2019-04-04 DIAGNOSIS — Z1231 Encounter for screening mammogram for malignant neoplasm of breast: Secondary | ICD-10-CM

## 2019-05-28 DIAGNOSIS — H5789 Other specified disorders of eye and adnexa: Secondary | ICD-10-CM | POA: Diagnosis not present

## 2019-05-29 ENCOUNTER — Other Ambulatory Visit: Payer: Self-pay

## 2019-05-29 ENCOUNTER — Ambulatory Visit
Admission: RE | Admit: 2019-05-29 | Discharge: 2019-05-29 | Disposition: A | Discharge status: Home / Self Care | Payer: 59 | Source: Ambulatory Visit | Attending: Gynecology | Admitting: Gynecology

## 2019-05-29 DIAGNOSIS — Z1231 Encounter for screening mammogram for malignant neoplasm of breast: Secondary | ICD-10-CM | POA: Diagnosis not present

## 2019-06-02 DIAGNOSIS — H1045 Other chronic allergic conjunctivitis: Secondary | ICD-10-CM | POA: Diagnosis not present

## 2019-06-09 DIAGNOSIS — H1045 Other chronic allergic conjunctivitis: Secondary | ICD-10-CM | POA: Diagnosis not present

## 2019-06-30 ENCOUNTER — Emergency Department (HOSPITAL_COMMUNITY): Payer: 59

## 2019-06-30 ENCOUNTER — Encounter (HOSPITAL_COMMUNITY): Payer: Self-pay | Admitting: *Deleted

## 2019-06-30 ENCOUNTER — Other Ambulatory Visit: Payer: Self-pay

## 2019-06-30 ENCOUNTER — Emergency Department (HOSPITAL_COMMUNITY)
Admission: EM | Admit: 2019-06-30 | Discharge: 2019-07-01 | Disposition: A | Discharge status: Home / Self Care | Payer: 59 | Attending: Emergency Medicine | Admitting: Emergency Medicine

## 2019-06-30 DIAGNOSIS — Y9355 Activity, bike riding: Secondary | ICD-10-CM | POA: Diagnosis not present

## 2019-06-30 DIAGNOSIS — Y999 Unspecified external cause status: Secondary | ICD-10-CM | POA: Diagnosis not present

## 2019-06-30 DIAGNOSIS — S99911A Unspecified injury of right ankle, initial encounter: Secondary | ICD-10-CM | POA: Diagnosis present

## 2019-06-30 DIAGNOSIS — S82851B Displaced trimalleolar fracture of right lower leg, initial encounter for open fracture type I or II: Secondary | ICD-10-CM

## 2019-06-30 DIAGNOSIS — Y9289 Other specified places as the place of occurrence of the external cause: Secondary | ICD-10-CM | POA: Diagnosis not present

## 2019-06-30 DIAGNOSIS — S8261XA Displaced fracture of lateral malleolus of right fibula, initial encounter for closed fracture: Secondary | ICD-10-CM | POA: Diagnosis not present

## 2019-06-30 DIAGNOSIS — S9304XA Dislocation of right ankle joint, initial encounter: Secondary | ICD-10-CM | POA: Diagnosis not present

## 2019-06-30 DIAGNOSIS — S82831A Other fracture of upper and lower end of right fibula, initial encounter for closed fracture: Secondary | ICD-10-CM | POA: Diagnosis not present

## 2019-06-30 DIAGNOSIS — S82851A Displaced trimalleolar fracture of right lower leg, initial encounter for closed fracture: Secondary | ICD-10-CM | POA: Insufficient documentation

## 2019-06-30 DIAGNOSIS — S8251XA Displaced fracture of medial malleolus of right tibia, initial encounter for closed fracture: Secondary | ICD-10-CM | POA: Diagnosis not present

## 2019-06-30 DIAGNOSIS — Z79899 Other long term (current) drug therapy: Secondary | ICD-10-CM | POA: Diagnosis not present

## 2019-06-30 DIAGNOSIS — S82301A Unspecified fracture of lower end of right tibia, initial encounter for closed fracture: Secondary | ICD-10-CM | POA: Diagnosis not present

## 2019-06-30 MED ORDER — KETAMINE HCL 10 MG/ML IJ SOLN
1.0000 mg/kg | Freq: Once | INTRAMUSCULAR | Status: AC
  Administered 2019-07-01: 58 mg via INTRAVENOUS
  Filled 2019-06-30: qty 1

## 2019-06-30 MED ORDER — HYDROMORPHONE HCL 1 MG/ML IJ SOLN
0.5000 mg | Freq: Once | INTRAMUSCULAR | Status: AC
  Administered 2019-06-30: 0.5 mg via INTRAVENOUS
  Filled 2019-06-30: qty 1

## 2019-06-30 MED ORDER — ONDANSETRON HCL 4 MG/2ML IJ SOLN
4.0000 mg | Freq: Once | INTRAMUSCULAR | Status: AC
  Administered 2019-06-30: 4 mg via INTRAVENOUS
  Filled 2019-06-30: qty 2
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
## 2019-07-01 ENCOUNTER — Emergency Department (HOSPITAL_COMMUNITY): Payer: 59

## 2019-07-01 DIAGNOSIS — S9304XA Dislocation of right ankle joint, initial encounter: Secondary | ICD-10-CM | POA: Diagnosis not present

## 2019-07-01 DIAGNOSIS — S82851A Displaced trimalleolar fracture of right lower leg, initial encounter for closed fracture: Secondary | ICD-10-CM | POA: Diagnosis not present

## 2019-07-01 DIAGNOSIS — Z79899 Other long term (current) drug therapy: Secondary | ICD-10-CM | POA: Diagnosis not present

## 2019-07-01 MED ORDER — PROMETHAZINE HCL 12.5 MG PO TABS
12.5000 mg | ORAL_TABLET | Freq: Once | ORAL | Status: AC
  Administered 2019-07-01: 12.5 mg via ORAL
  Filled 2019-07-01: qty 1

## 2019-07-01 MED ORDER — HYDROCODONE-ACETAMINOPHEN 5-325 MG PO TABS: 1.0000 | ORAL_TABLET | ORAL | 0 refills | Status: DC | PRN

## 2019-07-01 MED ORDER — ONDANSETRON HCL 4 MG PO TABS: 4.0000 mg | ORAL_TABLET | Freq: Four times a day (QID) | ORAL | 0 refills | Status: DC

## 2019-07-01 MED ORDER — HYDROCODONE-ACETAMINOPHEN 5-325 MG PO TABS
2.0000 | ORAL_TABLET | Freq: Once | ORAL | Status: AC
  Administered 2019-07-01: 2 via ORAL
  Filled 2019-07-01: qty 2

## 2019-07-01 MED ORDER — HYDROCODONE-ACETAMINOPHEN 7.5-325 MG PO TABS: 1.0000 | ORAL_TABLET | ORAL | 0 refills | Status: DC | PRN
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
## 2019-07-02 MED FILL — Hydrocodone-Acetaminophen Tab 5-325 MG: ORAL | Qty: 6 | Status: AC

## 2019-07-08 DIAGNOSIS — H524 Presbyopia: Secondary | ICD-10-CM | POA: Diagnosis not present

## 2019-07-08 DIAGNOSIS — H5213 Myopia, bilateral: Secondary | ICD-10-CM | POA: Diagnosis not present

## 2019-07-09 DIAGNOSIS — S82851A Displaced trimalleolar fracture of right lower leg, initial encounter for closed fracture: Secondary | ICD-10-CM | POA: Diagnosis not present

## 2019-07-14 NOTE — Progress Notes (Signed)
Laura [name], [place] [address] [place] [address] Phone: [phone] Fax: [phone]      Your procedure is scheduled on Thursday, 07/17/19.Marland Kitchen  Report to [facility] Main Entrance "A" at 8:30 A.M., and check in at the Admitting office.  Call this number if you have problems the morning of surgery:  [phone]  Call [phone] if you have any questions prior to your surgery date Monday-Friday 8am-4pm    Remember:  Do not eat after midnight the night before your surgery-(Wed)  You may drink clear liquids until 7:30 am the morning of your surgery. (Thurs) Clear liquids allowed are: Water, Non-Citrus Juices (without pulp), Carbonated Beverages, Clear Tea, Black Coffee Only, and Gatorade  Please complete your PRE-SURGERY ENSURE that was provided to you by 7:30 am, the morning of surgery.  Please, if able, drink it in one setting. DO NOT SIP.    Take these medicines the morning of surgery with A SIP OF WATER  acetaminophen (TYLENOL) if needed fluticasone (FLONASE Nasal Spray) if needed  STOP now taking any Aspirin (unless otherwise instructed by your surgeon), Aleve, Naproxen, Ibuprofen, Motrin, Advil, Goody's, BC's, all herbal medications, fish oil, and all vitamins.    The Morning of Surgery  Do not wear jewelry, make-up or nail polish.  Do not wear lotions, powders, or perfumes/colognes, or deodorant  Do not shave 48 hours prior to surgery.    Do not bring valuables to the hospital.  [facility] is not responsible for any belongings or valuables.  If you are a smoker, DO NOT Smoke 24 hours prior to surgery.  IF you wear a CPAP at night please bring your mask and tubing the morning of surgery.   Remember that you must have someone to transport you home after your surgery, and remain with you for 24 hours if you are discharged the same day.  Patients discharged the day of surgery will not be allowed to drive home.   Contacts, glasses,  hearing aids, dentures or bridgework may not be worn into surgery.   For patients admitted to the hospital, discharge time will be determined by your treatment team.    Special instructions:   [facility]- Preparing For Surgery  Before surgery, you can play an important role. Because skin is not sterile, your skin needs to be as free of germs as possible. You can reduce the number of germs on your skin by washing with CHG (chlorahexidine gluconate) Soap before surgery.  CHG is an antiseptic cleaner which kills germs and bonds with the skin to continue killing germs even after washing.    Oral Hygiene is also important to reduce your risk of infection.  Remember - BRUSH YOUR TEETH THE MORNING OF SURGERY WITH YOUR REGULAR TOOTHPASTE  Please do not use if you have an allergy to CHG or antibacterial soaps. If your skin becomes reddened/irritated stop using the CHG.  Do not shave (including legs and underarms) for at least 48 hours prior to first CHG shower. It is OK to shave your face.  Please follow these instructions carefully.   1. Shower the [employer] BEFORE SURGERY (Wed) and the MORNING OF SURGERY (Thurs) with CHG Soap.   2. If you chose to wash your hair, wash your hair first as usual with your normal shampoo.  3. After you shampoo, rinse your hair and body thoroughly to remove the shampoo.  4. Use CHG as you would any other liquid soap.  You can apply CHG directly to the skin and wash gently with a scrungie or a clean washcloth.   5. Apply the CHG Soap to your body ONLY FROM THE NECK DOWN.  Do not use on open wounds or open sores. Avoid contact with your eyes, ears, mouth and genitals (private parts). Wash Face and genitals (private parts)  with your normal soap.   6. Wash thoroughly, paying special attention to the area where your surgery will be performed.  7. Thoroughly rinse your body with warm water from the neck down.  8. DO NOT shower/wash with your normal soap after using and  rinsing off the CHG Soap.  9. Pat yourself dry with a CLEAN TOWEL.  10. Wear CLEAN PAJAMAS to bed the night before surgery, wear comfortable clothes the morning of surgery  11. Place CLEAN SHEETS on your bed the night of your first shower and DO NOT SLEEP WITH PETS.    Day of Surgery:  Do not apply any deodorants/lotions. Please shower the morning of surgery with the CHG soap  Please wear clean clothes to the hospital/surgery center.   Remember to brush your teeth WITH YOUR REGULAR TOOTHPASTE.   Please read over the following fact sheets that you were given.

## 2019-07-15 ENCOUNTER — Encounter (HOSPITAL_COMMUNITY)
Admission: RE | Admit: 2019-07-15 | Discharge: 2019-07-15 | Disposition: A | Discharge status: Home / Self Care | Payer: 59 | Source: Ambulatory Visit | Attending: Orthopedic Surgery | Admitting: Orthopedic Surgery

## 2019-07-15 ENCOUNTER — Other Ambulatory Visit (HOSPITAL_COMMUNITY)
Admission: RE | Admit: 2019-07-15 | Discharge: 2019-07-15 | Disposition: A | Discharge status: Home / Self Care | Payer: 59 | Source: Ambulatory Visit | Attending: Orthopedic Surgery | Admitting: Orthopedic Surgery

## 2019-07-15 ENCOUNTER — Inpatient Hospital Stay (HOSPITAL_COMMUNITY): Admission: RE | Admit: 2019-07-15 | Payer: 59 | Source: Ambulatory Visit

## 2019-07-15 ENCOUNTER — Encounter (HOSPITAL_COMMUNITY): Payer: Self-pay

## 2019-07-15 ENCOUNTER — Other Ambulatory Visit: Payer: Self-pay

## 2019-07-15 DIAGNOSIS — Z01812 Encounter for preprocedural laboratory examination: Secondary | ICD-10-CM | POA: Diagnosis not present

## 2019-07-15 DIAGNOSIS — S82891A Other fracture of right lower leg, initial encounter for closed fracture: Secondary | ICD-10-CM | POA: Insufficient documentation

## 2019-07-15 DIAGNOSIS — Z20828 Contact with and (suspected) exposure to other viral communicable diseases: Secondary | ICD-10-CM | POA: Insufficient documentation

## 2019-07-15 HISTORY — DX: Dizziness and giddiness: R42

## 2019-07-15 LAB — SURGICAL PCR SCREEN
MRSA, PCR: NEGATIVE
Staphylococcus aureus: NEGATIVE

## 2019-07-15 LAB — CBC
HCT: 37.9 % (ref 36.0–46.0)
Hemoglobin: 12.3 g/dL (ref 12.0–15.0)
MCH: 29.7 pg (ref 26.0–34.0)
MCHC: 32.5 g/dL (ref 30.0–36.0)
MCV: 91.5 fL (ref 80.0–100.0)
Platelets: 350 10*3/uL (ref 150–400)
RBC: 4.14 MIL/uL (ref 3.87–5.11)
RDW: 11.5 % (ref 11.5–15.5)
WBC: 5.8 10*3/uL (ref 4.0–10.5)
nRBC: 0 % (ref 0.0–0.2)

## 2019-07-15 LAB — SARS CORONAVIRUS 2 (TAT 6-24 HRS): SARS Coronavirus 2: NEGATIVE

## 2019-07-15 NOTE — Progress Notes (Signed)
PCP - Laurann Montana Cardiologist - none  Chest x-ray - none EKG - 2015 Stress Test - 2015 ECHO - 2015 Cardiac Cath - n/a  Sleep Study - n/a CPAP - n/a  Fasting Blood Sugar - n/a Checks Blood Sugar _____ times a day  Blood Thinner Instructions:n/a Aspirin Instructions:n/a  Anesthesia review: n/a  Patient denies shortness of breath, fever, cough and chest pain at PAT appointment   Patient verbalized understanding of instructions that were given to them at the PAT appointment. Patient was also instructed that they will need to review over the PAT instructions again at home before surgery.

## 2019-07-17 ENCOUNTER — Encounter (HOSPITAL_COMMUNITY): Payer: Self-pay | Admitting: Anesthesiology

## 2019-07-17 ENCOUNTER — Ambulatory Visit (HOSPITAL_COMMUNITY): Payer: 59

## 2019-07-17 ENCOUNTER — Ambulatory Visit (HOSPITAL_COMMUNITY): Payer: 59 | Admitting: Anesthesiology

## 2019-07-17 ENCOUNTER — Other Ambulatory Visit: Payer: Self-pay

## 2019-07-17 ENCOUNTER — Encounter (HOSPITAL_COMMUNITY)
Admission: RE | Disposition: A | Discharge status: Home / Self Care | Payer: Self-pay | Source: Home / Self Care | Attending: Orthopedic Surgery

## 2019-07-17 ENCOUNTER — Ambulatory Visit (HOSPITAL_COMMUNITY)
Admission: RE | Admit: 2019-07-17 | Discharge: 2019-07-17 | Disposition: A | Discharge status: Home / Self Care | Payer: 59 | Attending: Orthopedic Surgery | Admitting: Orthopedic Surgery

## 2019-07-17 DIAGNOSIS — W19XXXA Unspecified fall, initial encounter: Secondary | ICD-10-CM | POA: Diagnosis not present

## 2019-07-17 DIAGNOSIS — S8251XD Displaced fracture of medial malleolus of right tibia, subsequent encounter for closed fracture with routine healing: Secondary | ICD-10-CM | POA: Diagnosis not present

## 2019-07-17 DIAGNOSIS — S93431A Sprain of tibiofibular ligament of right ankle, initial encounter: Secondary | ICD-10-CM | POA: Insufficient documentation

## 2019-07-17 DIAGNOSIS — S82491D Other fracture of shaft of right fibula, subsequent encounter for closed fracture with routine healing: Secondary | ICD-10-CM | POA: Diagnosis not present

## 2019-07-17 DIAGNOSIS — S82851A Displaced trimalleolar fracture of right lower leg, initial encounter for closed fracture: Secondary | ICD-10-CM | POA: Diagnosis not present

## 2019-07-17 DIAGNOSIS — Z419 Encounter for procedure for purposes other than remedying health state, unspecified: Secondary | ICD-10-CM

## 2019-07-17 HISTORY — DX: Other fracture of unspecified lower leg, initial encounter for closed fracture: S82.899A

## 2019-07-17 HISTORY — PX: ORIF ANKLE FRACTURE: SHX5408

## 2019-07-17 SURGERY — OPEN REDUCTION INTERNAL FIXATION (ORIF) ANKLE FRACTURE
Anesthesia: General | Site: Ankle | Laterality: Right

## 2019-07-17 MED ORDER — DEXAMETHASONE SODIUM PHOSPHATE 10 MG/ML IJ SOLN
INTRAMUSCULAR | Status: AC
  Filled 2019-07-17: qty 1

## 2019-07-17 MED ORDER — LIDOCAINE HCL (CARDIAC) PF 100 MG/5ML IV SOSY
PREFILLED_SYRINGE | INTRAVENOUS | Status: DC | PRN
  Administered 2019-07-17: 100 mg via INTRAVENOUS

## 2019-07-17 MED ORDER — PROPOFOL 10 MG/ML IV BOLUS
INTRAVENOUS | Status: DC | PRN
  Administered 2019-07-17: 50 mg via INTRAVENOUS
  Administered 2019-07-17: 200 mg via INTRAVENOUS

## 2019-07-17 MED ORDER — CEFAZOLIN SODIUM-DEXTROSE 2-4 GM/100ML-% IV SOLN
2.0000 g | INTRAVENOUS | Status: AC
  Administered 2019-07-17: 2 g via INTRAVENOUS

## 2019-07-17 MED ORDER — FENTANYL CITRATE (PF) 100 MCG/2ML IJ SOLN
INTRAMUSCULAR | Status: AC
  Filled 2019-07-17: qty 2

## 2019-07-17 MED ORDER — LACTATED RINGERS IV SOLN
INTRAVENOUS | Status: DC
  Administered 2019-07-17: 09:00:00 via INTRAVENOUS

## 2019-07-17 MED ORDER — OXYCODONE HCL 5 MG PO TABS: 5.0000 mg | ORAL_TABLET | ORAL | 0 refills | Status: DC | PRN

## 2019-07-17 MED ORDER — HYDROMORPHONE HCL 1 MG/ML IJ SOLN: 0.2500 mg | INTRAMUSCULAR | Status: DC | PRN

## 2019-07-17 MED ORDER — ONDANSETRON HCL 4 MG/2ML IJ SOLN
INTRAMUSCULAR | Status: AC
  Filled 2019-07-17: qty 2

## 2019-07-17 MED ORDER — OXYCODONE HCL 5 MG PO TABS
5.0000 mg | ORAL_TABLET | Freq: Once | ORAL | Status: AC
  Administered 2019-07-17: 14:00:00 5 mg via ORAL

## 2019-07-17 MED ORDER — OXYCODONE HCL 5 MG PO TABS
ORAL_TABLET | ORAL | Status: AC
  Filled 2019-07-17: qty 1

## 2019-07-17 MED ORDER — MIDAZOLAM HCL 2 MG/2ML IJ SOLN
INTRAMUSCULAR | Status: AC
  Filled 2019-07-17: qty 2

## 2019-07-17 MED ORDER — ONDANSETRON 4 MG PO TBDP: 4.0000 mg | ORAL_TABLET | Freq: Three times a day (TID) | ORAL | 0 refills | Status: DC | PRN

## 2019-07-17 MED ORDER — CHLORHEXIDINE GLUCONATE 4 % EX LIQD: 60.0000 mL | Freq: Once | CUTANEOUS | Status: DC

## 2019-07-17 MED ORDER — CEFAZOLIN SODIUM-DEXTROSE 2-4 GM/100ML-% IV SOLN
INTRAVENOUS | Status: AC
  Filled 2019-07-17: qty 100

## 2019-07-17 MED ORDER — LIDOCAINE 2% (20 MG/ML) 5 ML SYRINGE
INTRAMUSCULAR | Status: AC
  Filled 2019-07-17: qty 5

## 2019-07-17 MED ORDER — FENTANYL CITRATE (PF) 250 MCG/5ML IJ SOLN
INTRAMUSCULAR | Status: AC
  Filled 2019-07-17: qty 5

## 2019-07-17 MED ORDER — 0.9 % SODIUM CHLORIDE (POUR BTL) OPTIME
TOPICAL | Status: DC | PRN
  Administered 2019-07-17: 1000 mL

## 2019-07-17 MED ORDER — FENTANYL CITRATE (PF) 100 MCG/2ML IJ SOLN
INTRAMUSCULAR | Status: DC | PRN
  Administered 2019-07-17 (×3): 25 ug via INTRAVENOUS

## 2019-07-17 MED ORDER — MIDAZOLAM HCL 2 MG/2ML IJ SOLN
2.0000 mg | Freq: Once | INTRAMUSCULAR | Status: AC
  Administered 2019-07-17: 2 mg via INTRAVENOUS

## 2019-07-17 MED ORDER — ONDANSETRON HCL 4 MG/2ML IJ SOLN
INTRAMUSCULAR | Status: DC | PRN
  Administered 2019-07-17: 4 mg via INTRAVENOUS

## 2019-07-17 MED ORDER — DEXAMETHASONE SODIUM PHOSPHATE 10 MG/ML IJ SOLN
INTRAMUSCULAR | Status: DC | PRN
  Administered 2019-07-17: 8 mg via INTRAVENOUS

## 2019-07-17 MED ORDER — PROPOFOL 10 MG/ML IV BOLUS
INTRAVENOUS | Status: AC
  Filled 2019-07-17: qty 20

## 2019-07-17 MED ORDER — MIDAZOLAM HCL 2 MG/2ML IJ SOLN
INTRAMUSCULAR | Status: AC
  Administered 2019-07-17: 2 mg via INTRAVENOUS
  Filled 2019-07-17: qty 2

## 2019-07-17 MED ORDER — FENTANYL CITRATE (PF) 100 MCG/2ML IJ SOLN
75.0000 ug | Freq: Once | INTRAMUSCULAR | Status: AC
  Administered 2019-07-17: 75 ug via INTRAVENOUS

## 2019-07-17 MED ORDER — FENTANYL CITRATE (PF) 100 MCG/2ML IJ SOLN
INTRAMUSCULAR | Status: AC
  Administered 2019-07-17: 75 ug via INTRAVENOUS
  Filled 2019-07-17: qty 2

## 2019-07-17 MED ORDER — FENTANYL CITRATE (PF) 100 MCG/2ML IJ SOLN: 25.0000 ug | INTRAMUSCULAR | Status: DC | PRN

## 2019-07-17 MED ORDER — FENTANYL CITRATE (PF) 100 MCG/2ML IJ SOLN
25.0000 ug | INTRAMUSCULAR | Status: DC | PRN
  Administered 2019-07-17 (×2): 25 ug via INTRAVENOUS

## 2019-07-17 SURGICAL SUPPLY — 72 items
ALCOHOL 70% 16 OZ (MISCELLANEOUS) ×2 IMPLANT
BANDAGE ESMARK 6X9 LF (GAUZE/BANDAGES/DRESSINGS) IMPLANT
BIT DRILL 2.5 X LONG (BIT) ×1
BIT DRILL CANN 2.7X625 NONSTRL (BIT) ×1 IMPLANT
BIT DRILL QC 3.5X110 (BIT) ×1 IMPLANT
BIT DRILL X LONG 2.5 (BIT) IMPLANT
BNDG CMPR 9X6 STRL LF SNTH (GAUZE/BANDAGES/DRESSINGS)
BNDG CMPR MED 10X6 ELC LF (GAUZE/BANDAGES/DRESSINGS) ×1
BNDG COHESIVE 4X5 TAN STRL (GAUZE/BANDAGES/DRESSINGS) ×2 IMPLANT
BNDG ELASTIC 4X5.8 VLCR STR LF (GAUZE/BANDAGES/DRESSINGS) ×1 IMPLANT
BNDG ELASTIC 6X10 VLCR STRL LF (GAUZE/BANDAGES/DRESSINGS) ×1 IMPLANT
BNDG ELASTIC 6X5.8 VLCR STR LF (GAUZE/BANDAGES/DRESSINGS) IMPLANT
BNDG ESMARK 6X9 LF (GAUZE/BANDAGES/DRESSINGS)
CANISTER SUCT 3000ML PPV (MISCELLANEOUS) ×2 IMPLANT
COVER SURGICAL LIGHT HANDLE (MISCELLANEOUS) ×2 IMPLANT
COVER WAND RF STERILE (DRAPES) ×2 IMPLANT
CUFF TOURN SGL QUICK 34 (TOURNIQUET CUFF)
CUFF TRNQT CYL 34X4.125X (TOURNIQUET CUFF) IMPLANT
DRAPE C-ARM 42X72 X-RAY (DRAPES) ×2 IMPLANT
DRAPE C-ARMOR (DRAPES) ×2 IMPLANT
DRAPE U-SHAPE 47X51 STRL (DRAPES) ×2 IMPLANT
DRILL BIT X LONG 2.5 (BIT) ×2
DRSG ADAPTIC 3X8 NADH LF (GAUZE/BANDAGES/DRESSINGS) ×2 IMPLANT
DRSG PAD ABDOMINAL 8X10 ST (GAUZE/BANDAGES/DRESSINGS) ×2 IMPLANT
DURAPREP 26ML APPLICATOR (WOUND CARE) ×2 IMPLANT
ELECT REM PT RETURN 9FT ADLT (ELECTROSURGICAL) ×2
ELECTRODE REM PT RTRN 9FT ADLT (ELECTROSURGICAL) ×1 IMPLANT
GAUZE SPONGE 4X4 12PLY STRL (GAUZE/BANDAGES/DRESSINGS) ×2 IMPLANT
GAUZE SPONGE 4X4 12PLY STRL LF (GAUZE/BANDAGES/DRESSINGS) ×1 IMPLANT
GLOVE BIO SURGEON STRL SZ7.5 (GLOVE) ×2 IMPLANT
GLOVE BIOGEL PI IND STRL 8 (GLOVE) ×1 IMPLANT
GLOVE BIOGEL PI INDICATOR 8 (GLOVE) ×1
GLOVE ECLIPSE 7.0 STRL STRAW (GLOVE) ×1 IMPLANT
GLOVE ORTHO TXT STRL SZ7.5 (GLOVE) ×1 IMPLANT
GOWN STRL REUS W/ TWL LRG LVL3 (GOWN DISPOSABLE) ×2 IMPLANT
GOWN STRL REUS W/ TWL XL LVL3 (GOWN DISPOSABLE) ×1 IMPLANT
GOWN STRL REUS W/TWL LRG LVL3 (GOWN DISPOSABLE) ×4
GOWN STRL REUS W/TWL XL LVL3 (GOWN DISPOSABLE) ×2
KIT BASIN OR (CUSTOM PROCEDURE TRAY) ×2 IMPLANT
KIT TURNOVER KIT B (KITS) ×2 IMPLANT
NDL HYPO 25GX1X1/2 BEV (NEEDLE) IMPLANT
NEEDLE HYPO 25GX1X1/2 BEV (NEEDLE) IMPLANT
NS IRRIG 1000ML POUR BTL (IV SOLUTION) ×2 IMPLANT
PACK ORTHO EXTREMITY (CUSTOM PROCEDURE TRAY) ×2 IMPLANT
PAD ABD 8X10 STRL (GAUZE/BANDAGES/DRESSINGS) ×3 IMPLANT
PAD ARMBOARD 7.5X6 YLW CONV (MISCELLANEOUS) ×4 IMPLANT
PAD CAST 4YDX4 CTTN HI CHSV (CAST SUPPLIES) ×2 IMPLANT
PADDING CAST COTTON 4X4 STRL (CAST SUPPLIES) ×4
PADDING CAST COTTON 6X4 STRL (CAST SUPPLIES) ×2 IMPLANT
PADDING CAST SYNTHETIC 4 (CAST SUPPLIES) ×1
PADDING CAST SYNTHETIC 4X4 STR (CAST SUPPLIES) IMPLANT
PLATE LCP 3.5 1/3 TUB 6HX69 (Plate) ×1 IMPLANT
SCREW CANC FT ST SFS 4X16 (Screw) ×2 IMPLANT
SCREW CORTEX 3.5 12MM (Screw) ×3 IMPLANT
SCREW CORTEX 3.5 22MM (Screw) ×1 IMPLANT
SCREW LOCK CORT ST 3.5X12 (Screw) IMPLANT
SCREW LOCK CORT ST 3.5X22 (Screw) IMPLANT
SCREW SHORT THREAD 4.0X34 (Screw) ×1 IMPLANT
SCREW SHORT THREAD 4.0X40 (Screw) ×1 IMPLANT
SPLINT FIBERGLASS 4X30 (CAST SUPPLIES) ×1 IMPLANT
SPONGE LAP 18X18 RF (DISPOSABLE) IMPLANT
SUCTION FRAZIER HANDLE 10FR (MISCELLANEOUS) ×1
SUCTION TUBE FRAZIER 10FR DISP (MISCELLANEOUS) ×1 IMPLANT
SUT ETHILON 3 0 PS 1 (SUTURE) ×4 IMPLANT
SUT VIC AB 0 CT1 27 (SUTURE) ×2
SUT VIC AB 0 CT1 27XBRD ANBCTR (SUTURE) IMPLANT
SUT VIC AB 2-0 CT1 27 (SUTURE) ×4
SUT VIC AB 2-0 CT1 TAPERPNT 27 (SUTURE) ×1 IMPLANT
SYR CONTROL 10ML LL (SYRINGE) IMPLANT
TOWEL GREEN STERILE (TOWEL DISPOSABLE) ×4 IMPLANT
TUBE CONNECTING 12X1/4 (SUCTIONS) ×2 IMPLANT
YANKAUER SUCT BULB TIP NO VENT (SUCTIONS) ×2 IMPLANT

## 2019-07-17 NOTE — Anesthesia Procedure Notes (Signed)
Procedure Name: LMA Insertion Date/Time: 07/17/2019 10:56 AM Performed by: Jenne Campus, CRNA Pre-anesthesia Checklist: Patient identified, Emergency Drugs available, Suction available and Patient being monitored Patient Re-evaluated:Patient Re-evaluated prior to induction Oxygen Delivery Method: Circle System Utilized Preoxygenation: Pre-oxygenation with 100% oxygen Induction Type: IV induction Ventilation: Mask ventilation without difficulty LMA: LMA inserted LMA Size: 4.0 Number of attempts: 1 Airway Equipment and Method: Bite block Placement Confirmation: positive ETCO2 and breath sounds checked- equal and bilateral Tube secured with: Tape Dental Injury: Teeth and Oropharynx as per pre-operative assessment

## 2019-07-17 NOTE — Brief Op Note (Signed)
07/17/2019  12:18 PM  PATIENT:  Laura [name]  53 y.o. female  PRE-OPERATIVE DIAGNOSIS:  Right ankle trimalleolar ankle fracture  POST-OPERATIVE DIAGNOSIS:  Right ankle trimalleolar ankle fracture  PROCEDURE:  Procedure(s) with comments: OPEN REDUCTION INTERNAL FIXATION (ORIF) ANKLE FRACTURE (Right) - 90 mins  SURGEON:  Surgeon(s) and Role:    * Stann Mainland, Elly Modena, MD - Primary  PHYSICIAN ASSISTANT:   ASSISTANTS: Laure Kidney, RNFA   ANESTHESIA:   regional and general  EBL:  25 mL   BLOOD ADMINISTERED:none  DRAINS: none   LOCAL MEDICATIONS USED:  NONE  SPECIMEN:  No Specimen  DISPOSITION OF SPECIMEN:  N/A  COUNTS:  YES  TOURNIQUET:   Total Tourniquet Time Documented: Thigh (Right) - 63 minutes Total: Thigh (Right) - 63 minutes   DICTATION: .Note written in EPIC  PLAN OF CARE: Discharge to home after PACU  PATIENT DISPOSITION:  PACU - hemodynamically stable.   Delay start of Pharmacological VTE agent (>24hrs) due to surgical blood loss or risk of bleeding: not applicable

## 2019-07-17 NOTE — Discharge Instructions (Signed)
Discharge instructions:  -Remain nonweightbearing at all times to the right lower extremity. -Elevate your right lower extremity with your "toes above nose" at all times.  -Maintain your splint clean and dry at all times.  -For mild to moderate pain use Tylenol and Advil around-the-clock as directed.  For breakthrough pain use oxycodone as needed.  -Return to see Dr. Stann Mainland in 2 weeks for routine postoperative wound check and suture removal.  -For the prevention of deep vein thrombosis take a 325 mg aspirin by mouth once per day for 6 weeks.

## 2019-07-17 NOTE — H&P (Signed)
ORTHOPAEDIC H and P  REQUESTING PHYSICIAN: Nicholes Stairs, MD  PCP:  Lavone Orn, MD  Chief Complaint: Right ankle fracture  HPI: Laura [name] is a 53 y.o. female who complains of right ankle pain and deformity followinga  Fall.  She sustained a right unstable ankle fracture.  She is here today for open reduction internal fixation.  No new complaints at this time.  Past Medical History:  Diagnosis Date  . Ankle fracture    Right  . ASCUS favor benign 2004   colposcopy negative  . Vertigo    on occasion    Past Surgical History:  Procedure Laterality Date  . ABDOMINAL HYSTERECTOMY  01/2010   Leiomyomata  . CESAREAN SECTION  1991   x1  . COLONOSCOPY N/A 09/03/2014   Procedure: COLONOSCOPY;  Surgeon: Rogene Houston, MD;  Location: [facility];  Service: Endoscopy;  Laterality: N/A;  830  . COLPOSCOPY    . TUBAL LIGATION     Social History   Socioeconomic History  . Marital status: Married    Spouse name: Not on file  . Number of children: Not on file  . Years of education: Not on file  . Highest education level: Not on file  Occupational History  . Not on file  Social Needs  . Financial resource strain: Not on file  . Food insecurity    Worry: Not on file    Inability: Not on file  . Transportation needs    Medical: Not on file    Non-medical: Not on file  Tobacco Use  . Smoking status: Never Smoker  . Smokeless tobacco: Never Used  Substance and Sexual Activity  . Alcohol use: Yes    Alcohol/week: 0.0 standard drinks    Comment: Rare  . Drug use: No  . Sexual activity: Not Currently    Birth control/protection: Surgical    Comment: HYST-1st intercourse 53 yo-Fewer than 5 partners  Lifestyle  . Physical activity    Days per week: Not on file    Minutes per session: Not on file  . Stress: Not on file  Relationships  . Social Herbalist on phone: Not on file    Gets together: Not on file    Attends religious service: Not  on file    Active member of club or organization: Not on file    Attends meetings of clubs or organizations: Not on file    Relationship status: Not on file  Other Topics Concern  . Not on file  Social History Narrative  . Not on file   Family History  Problem Relation Age of Onset  . Stroke Mother   . Cancer Maternal Grandmother        Colon cancer  . Breast cancer Paternal Aunt        ? age   No Known Allergies Prior to Admission medications   Medication Sig Start Date End Date Taking? Authorizing Provider  acetaminophen (TYLENOL) 500 MG tablet Take 1,000 mg by mouth every 6 (six) hours as needed (for pain.).   Yes [provider]  Cholecalciferol (VITAMIN D3) 50 MCG (2000 UT) TABS Take 2,000 Units by mouth daily.   Yes [provider]  CINNAMON PO Take 2,000 mg by mouth daily.   Yes [provider]  ferrous sulfate 325 (65 FE) MG tablet Take 325 mg by mouth daily.   Yes [provider]  fluticasone (FLONASE) 50 MCG/ACT nasal spray  Place 1 spray into both nostrils daily as needed for allergies.    Yes [provider]  Multiple Vitamins-Minerals (AIRBORNE PO) Take 1 Dose by mouth daily as needed (immune support).   Yes [provider]  Soft Lens Products (REWETTING DROPS) SOLN Place 1 drop into both eyes daily as needed (dry/irritated eyes.).   Yes [provider]  vitamin B-12 (CYANOCOBALAMIN) 1000 MCG tablet Take 2,000 mcg by mouth daily.   Yes [provider]  HYDROcodone-acetaminophen (NORCO) 7.5-325 MG tablet Take 1 tablet by mouth every 4 (four) hours as needed. Patient not taking: Reported on 07/14/2019 07/01/19   Lily Kocher, PA-C  HYDROcodone-acetaminophen (NORCO/VICODIN) 5-325 MG tablet Take 1-2 tablets by mouth every 4 (four) hours as needed. Patient not taking: Reported on 07/14/2019 07/01/19   Lily Kocher, PA-C  ondansetron (ZOFRAN) 4 MG tablet Take 1 tablet (4 mg total) by mouth every 6 (six)  hours. Patient not taking: Reported on 07/14/2019 07/01/19   Lily Kocher, PA-C   No results found.  Positive ROS: All other systems have been reviewed and were otherwise negative with the exception of those mentioned in the HPI and as above.  Physical Exam: General: Alert, no acute distress Cardiovascular: No pedal edema Respiratory: No cyanosis, no use of accessory musculature GI: No organomegaly, abdomen is soft and non-tender Skin: No lesions in the area of chief complaint Neurologic: Sensation intact distally Psychiatric: Patient is competent for consent with normal mood and affect Lymphatic: No axillary or cervical lymphadenopathy  MUSCULOSKELETAL:  Right ankle splint in place.  Skin is warm and well-perfused.  Capillary refill less than 2 seconds.  Neurovascular intact.  Assessment: Right ankle trimalleolar fracture  Plan: -Plan for open reduction internal fixation today.  We have previously discussed the risk and benefits of this procedure.  She has provided informed consent.  -We will plan for discharge home from PACU postoperatively.  - The risks, benefits, and alternatives were discussed with the patient. There are risks associated with the surgery including, but not limited to, problems with anesthesia (death), infection, differences in leg length/angulation/rotation, fracture of bones, loosening or failure of implants, malunion, nonunion, hematoma (blood accumulation) which may require surgical drainage, blood clots, pulmonary embolism, nerve injury (foot drop), and blood vessel injury. The patient understands these risks and elects to proceed.     Nicholes Stairs, MD [facility] [phone]    07/17/2019 10:19 AM

## 2019-07-17 NOTE — OR Nursing (Addendum)
The patient was extremely nervous about the procedure as evidenced by continuing to hold my hand long after she was on the operating table and until anesthesia took effect. As soon as she moved to the  operating table, the patient grabbed onto my hand and would not let go.  Clearly very nervous, I continued to  hold her hand and say encouraging things to her.

## 2019-07-17 NOTE — Transfer of Care (Signed)
Immediate Anesthesia Transfer of Care Note  Patient: Laura [name]  Procedure(s) Performed: OPEN REDUCTION INTERNAL FIXATION (ORIF) ANKLE FRACTURE (Right Ankle)  Patient Location: PACU  Anesthesia Type:General  Level of Consciousness: oriented, drowsy and patient cooperative  Airway & Oxygen Therapy: Patient Spontanous Breathing and Patient connected to nasal cannula oxygen  Post-op Assessment: Report given to RN and Post -op Vital signs reviewed and stable  Post vital signs: Reviewed  Last Vitals:  Vitals Value Taken Time  BP 107/85 07/17/19 1230  Temp    Pulse 94 07/17/19 1232  Resp 16 07/17/19 1232  SpO2 100 % 07/17/19 1232  Vitals shown include unvalidated device data.  Last Pain:  Vitals:   07/17/19 1025  TempSrc:   PainSc: 0-No pain         Complications: No apparent anesthesia complications

## 2019-07-17 NOTE — Anesthesia Preprocedure Evaluation (Addendum)
Anesthesia Evaluation  Patient identified by MRN, date of birth, ID band Patient awake    Reviewed: Allergy & Precautions, NPO status , Patient's Chart, lab work & pertinent test results  Airway Mallampati: II  TM Distance: >3 FB     Dental   Pulmonary neg pulmonary ROS,    breath sounds clear to auscultation       Cardiovascular negative cardio ROS   Rhythm:Regular Rate:Normal     Neuro/Psych    GI/Hepatic negative GI ROS, Neg liver ROS,   Endo/Other  negative endocrine ROS  Renal/GU negative Renal ROS     Musculoskeletal   Abdominal   Peds  Hematology   Anesthesia Other Findings   Reproductive/Obstetrics                             Anesthesia Physical Anesthesia Plan  ASA: I  Anesthesia Plan: General   Post-op Pain Management:  Regional for Post-op pain   Induction: Intravenous  PONV Risk Score and Plan: Ondansetron, Dexamethasone and Midazolam  Airway Management Planned: LMA  Additional Equipment:   Intra-op Plan:   Post-operative Plan: Extubation in OR  Informed Consent: I have reviewed the patients History and Physical, chart, labs and discussed the procedure including the risks, benefits and alternatives for the proposed anesthesia with the patient or authorized representative who has indicated his/her understanding and acceptance.     Dental advisory given  Plan Discussed with: CRNA and Anesthesiologist  Anesthesia Plan Comments:        Anesthesia Quick Evaluation

## 2019-07-17 NOTE — Op Note (Signed)
Date of Surgery: 07/17/2019  INDICATIONS: Laura [name] is a 53 y.o.-year-old female who sustained a right ankle fracture; she was indicated for open reduction and internal fixation due to the displaced nature of the articular fracture and came to the operating room today for this procedure. The patient did consent to the procedure after discussion of the risks and benefits.  PREOPERATIVE DIAGNOSIS: right Trimalleolar ankle fracture  POSTOPERATIVE DIAGNOSIS: Same.  PROCEDURE:  1. Open treatment of right ankle fracture with internal fixation. Trimalleolar w/o fixation of posterior malleolus CPT 27822.   SURGEON: Roopa Graver P. Stann Mainland, M.D.  ASSIST: Laure Kidney, RNFA.  ANESTHESIA:  general, and regional  TOURNIQUET TIME: 58 mm @ 300 mm Hg  IV FLUIDS AND URINE: See anesthesia.  ESTIMATED BLOOD LOSS: 25 mL.  IMPLANTS: Synthes 1/3 tubular stainless steel plate (7 hole) 2 4.0 mm cannulated, partially threaded screws medial  COMPLICATIONS: None.  DESCRIPTION OF PROCEDURE: The patient was brought to the operating room and placed supine on the operating table.  The patient had been signed prior to the procedure and this was documented. The patient had the anesthesia placed by the anesthesiologist.  A nonsterile tourniquet was placed on the upper thigh.  The prep verification and incision time-outs were performed to confirm that this was the correct patient, site, side and location. The patient had an SCD on the opposite lower extremity. The patient did receive antibiotics prior to the incision and was re-dosed during the procedure as needed at indicated intervals.  The patient had the lower extremity prepped and draped in the standard surgical fashion.  The extremity was exsanguinated using an esmarch bandage and the tourniquet was inflated to 300 mm Hg.   Incision was made over the distal fibula and the fracture was exposed and reduced anatomically with a clamp. A lag screw was placed.   Interestingly, the anterior syndesmotic ligament had avulsed a large chevron shaped piece of anterior distal fibula.  This was creating a three-part distal fibula fracture.  The lag screw was able to be placed anterior to the plate placement and capture the anterior inferior tibiofibular ligaments.  I then applied a 1/3 tubular locking plate and secured it proximally and distally with non-locking screws. Bone quality was fair. I used c-arm to confirm satisfactory reduction and fixation.   I then turned my attention to the medial malleolus. Incision was made over the medial malleolus and the fracture exposed and held provisionally with a clamp. 2 guidepins were placed for the 4.0 mm cannulated screws and then confirmation of reduction was made with fluoroscopy. I then placed 1 16mm and 134 mm screws which had satisfactory fixation.   The syndesmosis was stressed using live fluoroscopy and found to be stable.   The wounds were irrigated, and closed with vicryl with routine closure for the skin with 3-0 nylon. The wounds were injected with local anesthetic. Sterile gauze was applied followed by a posterior splint. She was awakened and returned to the PACU in stable and satisfactory condition. There were no complications.  All counts were correct x2.    POSTOPERATIVE PLAN: Laura [name] will remain nonweightbearing on this leg for approximately 6 weeks; Laura [name] will return for suture removal in 2 weeks.  He will be immobilized in a short leg splint and then transitioned to a CAM walker at his first follow up appointment.  Laura [name] will receive DVT prophylaxis based on other medications, activity level, and risk ratio of bleeding to thrombosis.  Laura [name] P  Stann Mainland, MD [facility] [place] [phone] 12:19 PM

## 2019-07-18 NOTE — Anesthesia Postprocedure Evaluation (Signed)
Anesthesia Post Note  Patient: Laura [name]  Procedure(s) Performed: OPEN REDUCTION INTERNAL FIXATION (ORIF) ANKLE FRACTURE (Right Ankle)     Anesthesia Post Evaluation  Last Vitals:  Vitals:   07/17/19 1356 07/17/19 1400  BP: 111/65   Pulse: 83   Resp: 16   Temp:  36.6 C  SpO2: 97%     Last Pain:  Vitals:   07/17/19 1330  TempSrc:   PainSc: Asleep                 Seanpatrick Maisano

## 2019-07-20 ENCOUNTER — Encounter (HOSPITAL_COMMUNITY): Payer: Self-pay | Admitting: Orthopedic Surgery

## 2019-07-31 DIAGNOSIS — S82851D Displaced trimalleolar fracture of right lower leg, subsequent encounter for closed fracture with routine healing: Secondary | ICD-10-CM | POA: Diagnosis not present

## 2019-07-31 DIAGNOSIS — Z4789 Encounter for other orthopedic aftercare: Secondary | ICD-10-CM | POA: Diagnosis not present

## 2019-08-12 DIAGNOSIS — Z8489 Family history of other specified conditions: Secondary | ICD-10-CM | POA: Diagnosis not present

## 2019-08-12 DIAGNOSIS — E7800 Pure hypercholesterolemia, unspecified: Secondary | ICD-10-CM | POA: Diagnosis not present

## 2019-08-12 DIAGNOSIS — Z0000 Encounter for general adult medical examination without abnormal findings: Secondary | ICD-10-CM | POA: Diagnosis not present

## 2019-08-14 ENCOUNTER — Other Ambulatory Visit: Payer: Self-pay | Admitting: Internal Medicine

## 2019-08-14 DIAGNOSIS — Z4789 Encounter for other orthopedic aftercare: Secondary | ICD-10-CM | POA: Diagnosis not present

## 2019-08-15 ENCOUNTER — Encounter (HOSPITAL_COMMUNITY): Payer: Self-pay | Admitting: Physical Therapy

## 2019-08-15 ENCOUNTER — Ambulatory Visit (HOSPITAL_COMMUNITY): Payer: 59 | Attending: Orthopedic Surgery | Admitting: Physical Therapy

## 2019-08-15 ENCOUNTER — Other Ambulatory Visit: Payer: Self-pay

## 2019-08-15 DIAGNOSIS — R2689 Other abnormalities of gait and mobility: Secondary | ICD-10-CM | POA: Insufficient documentation

## 2019-08-15 DIAGNOSIS — M6281 Muscle weakness (generalized): Secondary | ICD-10-CM | POA: Diagnosis not present

## 2019-08-15 DIAGNOSIS — M25671 Stiffness of right ankle, not elsewhere classified: Secondary | ICD-10-CM | POA: Insufficient documentation

## 2019-08-15 DIAGNOSIS — M25571 Pain in right ankle and joints of right foot: Secondary | ICD-10-CM | POA: Insufficient documentation

## 2019-08-15 DIAGNOSIS — S82851A Displaced trimalleolar fracture of right lower leg, initial encounter for closed fracture: Secondary | ICD-10-CM | POA: Insufficient documentation
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
## 2019-08-15 NOTE — Patient Instructions (Addendum)
ROM: Plantar / Dorsiflexion    With left leg relaxed, gently flex and extend ankle. Move through full range of motion. Avoid pain. Repeat __10__ times per set. Do __1__ sets per session. Do __3__ sessions per day.  http://orth.exer.us/34   Copyright  VHI. All rights reserved.  ROM: Inversion / Eversion    With left leg relaxed, gently turn ankle and foot in and out. Move through full range of motion. Avoid pain. Repeat _10___ times per set. Do __1__ sets per session. Do __2__ sessions per day.  http://orth.exer.us/36   Copyright  VHI. All rights reserved.  Dorsiflexion: Isometric    With ball or rolled pillow between feet, squeeze feet together. Hold __3__ seconds. Relax. Repeat _10___ times per set. Do __1__ sets per session. Do _2___ sessions per day.  http://orth.exer.us/2   Copyright  VHI. All rights reserved.  Plantar Flexion: Isometric    Press left foot into ball or rolled pillow against wall. Hold ___5_ seconds. Relax. Repeat 10____ times per set. Do _1___ sets per session. Do ___2_ sessions per day.  http://orth.exer.us/0   Copyright  VHI. All rights reserved.  Ankle Circles    Slowly rotate right foot and ankle clockwise then counterclockwise. Gradually increase range of motion. Avoid pain. Circle __10__ times each direction per set. Do __1__ sets per session. Do __2__ sessions per day.  http://orth.exer.us/30   Copyright  VHI. All rights reserved.

## 2019-08-18 ENCOUNTER — Other Ambulatory Visit: Payer: Self-pay

## 2019-08-18 ENCOUNTER — Ambulatory Visit (HOSPITAL_COMMUNITY): Payer: 59 | Admitting: Physical Therapy

## 2019-08-18 DIAGNOSIS — R2689 Other abnormalities of gait and mobility: Secondary | ICD-10-CM

## 2019-08-18 DIAGNOSIS — M6281 Muscle weakness (generalized): Secondary | ICD-10-CM | POA: Diagnosis not present

## 2019-08-18 DIAGNOSIS — S82851A Displaced trimalleolar fracture of right lower leg, initial encounter for closed fracture: Secondary | ICD-10-CM | POA: Diagnosis not present

## 2019-08-18 DIAGNOSIS — M25671 Stiffness of right ankle, not elsewhere classified: Secondary | ICD-10-CM | POA: Diagnosis not present

## 2019-08-18 DIAGNOSIS — M25571 Pain in right ankle and joints of right foot: Secondary | ICD-10-CM | POA: Diagnosis not present

## 2019-08-18 NOTE — Therapy (Signed)
[facility] [address] [place], [place], [address] Phone: [phone]   Fax:  [phone]  Physical Therapy Treatment  Patient Details  Name: Laura [name] MRN: [id number] Date of Birth: 1966/05/02 Referring Provider (PT): Victorino December   Encounter Date: 08/18/2019  PT End of Session - 08/18/19 1628    Visit Number  2    Number of Visits  24    Date for PT Re-Evaluation  10/14/19    Authorization Type  UMR    PT Start Time  [id number]    PT Stop Time  1615    PT Time Calculation (min)  45 min    Activity Tolerance  Patient limited by pain    Behavior During Therapy  [facility] for tasks assessed/performed       Past Medical History:  Diagnosis Date  . Ankle fracture    Right  . ASCUS favor benign 2004   colposcopy negative  . Vertigo    on occasion     Past Surgical History:  Procedure Laterality Date  . ABDOMINAL HYSTERECTOMY  01/2010   Leiomyomata  . CESAREAN SECTION  1991   x1  . COLONOSCOPY N/A 09/03/2014   Procedure: COLONOSCOPY;  Surgeon: Rogene Houston, MD;  [facility];  Service: Endoscopy;  Laterality: N/A;  830  . COLPOSCOPY    . ORIF ANKLE FRACTURE Right 07/17/2019   Procedure: OPEN REDUCTION INTERNAL FIXATION (ORIF) ANKLE FRACTURE;  Surgeon: Nicholes Stairs, MD;  Location: [facility];  Service: Orthopedics;  Laterality: Right;  90 mins  . TUBAL LIGATION      There were no vitals filed for this visit.  Subjective Assessment - 08/18/19 1600    Subjective  Pt has purchased a compression stocking, she is icing and elevating but still her ankle is swelling    Pertinent History  unremarkable    Limitations  Sitting;Lifting;Standing;Walking;House hold activities    How long can you sit comfortably?  30    How long can you stand comfortably?  10 minutes putting all wt on Lt LE    How long can you walk comfortably?  using crutches NWB has only walked about 50 ft at a time    Patient Stated Goals  drive,  walk without crutches or boot, bike    Currently in Pain?  Yes    Pain Score  7     Pain Location  Ankle    Pain Orientation  Right    Pain Descriptors / Indicators  Tightness;Throbbing    Pain Type  Acute pain    Pain Onset  1 to 4 weeks ago               [facility] Adult PT Treatment/Exercise - 08/18/19 0001      Ambulation/Gait   Ambulation/Gait  Yes    Ambulation/Gait Assistance  6: Modified independent (Device/Increase time)    Ambulation Distance (Feet)  150 Feet    Assistive device  Crutches   Walking boot   Gait Pattern  Step-through pattern    Gait Comments  --      Exercises   Exercises  Ankle      Manual Therapy   Manual Therapy  Edema management;Soft tissue mobilization    Manual therapy comments  done seperate from all other aspects of treatment.     Soft tissue mobilization  LE was cleansed , moisturized followed by retromassage to decrease swelling.       Ankle  Exercises: Seated   ABC's  1 rep    Heel Slides  Right;5 reps      Ankle Exercises: Supine   Isometrics  All x 10     Other Supine Ankle Exercises  Dorsi/plantarflexion x 10/ in eversion x 10; circles x 10              PT Education - 08/18/19 1628    Education Details  new HEP    Person(s) Educated  Patient    Methods  Explanation;Demonstration;Handout;Verbal cues    Comprehension  Verbalized understanding       PT Short Term Goals - 08/18/19 1631      PT SHORT TERM GOAL #1   Title  PT ROM in Rt LE is wnl to allow normal gait    Time  4    Period  Weeks    Status  On-going    Target Date  09/12/19      PT SHORT TERM GOAL #2   Title  Pt to be ambulating without  assistive device and without walking boot    Time  4    Period  Weeks    Status  On-going      PT SHORT TERM GOAL #3   Title  PT pain to be no greater than a 5/10 to allow pt to walk for up to 40 minutes at a time to be able to complete shopping/ housework activity.    Time  4    Period  Weeks    Status  On-going         PT Long Term Goals - 08/18/19 1631      PT LONG TERM GOAL #1   Title  Pt strength of LE mm to be at least 4+/5 to allow pt to be able to go up and down 2 flights of steps with one hand rail assist without increased pain.    Time  8    Period  Weeks    Status  On-going      PT LONG TERM GOAL #2   Title  PT pain to be no greater than a 2/10 to allow pt to ambulate/ stand for 2 hours at a time to be ready for RTW duties.    Time  8    Period  Weeks    Status  On-going      PT LONG TERM GOAL #3   Title  PT to be able to single leg stance on both LE for at least 45 seconds to allow pt to be confident walking  up/down hills on uneven terrain.    Time  8    Period  Weeks    Status  On-going            Plan - 08/18/19 1629    Clinical Impression Statement  Reveiewed goals with patient.  Gait trained for PWB, added new exercises as indicated.  Retromassage to LE to decrease edema.  Pt has improved in her ROM but is still significantly limited.  Will stay with ROM exercises untile WFL then begin strengthening.    Examination-Activity Limitations  Bathing;Bed Mobility;Caring for Others;Carry;Dressing;Lift;Locomotion Level;Sit;Sleep;Squat;Stairs;Stand;Toileting;Transfers    Examination-Participation Restrictions  Church;Cleaning;Community Activity;Driving;Interpersonal Relationship;Laundry;Meal Prep;Shop;Yard Work    Stability/Clinical Decision Making  Stable/Uncomplicated    Rehab Potential  Good    PT Frequency  3x / week    PT Duration  8 weeks    PT Treatment/Interventions  ADLs/Self Care Home Management;Cryotherapy;Electrical Stimulation;DME Instruction;Gait training;Stair training;Functional  mobility training;Therapeutic activities;Therapeutic exercise;Balance training;Patient/family education;Manual techniques;Manual lymph drainage;Passive range of motion;Scar mobilization;Dry needling    PT Next Visit Plan  PROM to promote motion, and manual.  Progress to baps,; sidelying  in/eversion, prone plantarflexion then gradually advance to B and unilateral strengthening and balance.    PT Home Exercise Plan  ROM, Circles, and isometrics for dorsiflexion/plantarflexion       Patient will benefit from skilled therapeutic intervention in order to improve the following deficits and impairments:  Abnormal gait, Decreased activity tolerance, Decreased balance, Decreased endurance, Decreased knowledge of use of DME, Decreased mobility, Decreased range of motion, Decreased strength, Difficulty walking, Increased edema, Increased fascial restricitons, Pain  Visit Diagnosis: Stiffness of right ankle, not elsewhere classified  Other abnormalities of gait and mobility  Muscle weakness (generalized)  Pain in right ankle and joints of right foot     Problem List Patient Active Problem List   Diagnosis Date Noted  . Closed right trimalleolar fracture, initial encounter 07/17/2019   Rayetta Humphrey, PT CLT [phone] 08/18/2019, 4:33 PM  [facility] [address] [place], [place], [address] Phone: [phone]   Fax:  [phone]  Name: Laura [name] MRN: [id number] Date of Birth: Apr 10, 1966

## 2019-08-18 NOTE — Patient Instructions (Addendum)
Dorsiflexion: Self-Mobilization (Sitting)    Feet flat, other foot forward, slide right foot back until gentle stretch is felt. Keep entire foot on floor. Hold _3-5___ seconds. Relax. Repeat __10__ times per set. Do _1___ sets per session. Do ___2_ sessions per day.  http://orth.exer.us/82   Copyright  VHI. All rights reserved.  Ankle Alphabet    Using right ankle and foot only, trace the letters of the alphabet. Perform A to Z. Repeat __1__ times per set. Do __1_ sets per session. Do ___1-2_ sessions per day.  http://orth.exer.us/16   Copyright  VHI. All rights reserved.  Eversion: Isometric    Press outer border of right foot into ball or rolled pillow against wall. Hold __5__ seconds. Relax. Repeat _10___ times per set. Do __1__ sets per session. Do _2___ sessions per day.  http://orth.exer.us/4   Copyright  VHI. All rights reserved.  Inversion: Isometric    Press inner borders of feet into ball or rolled pillow between feet. Hold _5___ seconds. Relax. Repeat __10__ times per set. Do __1__ sets per session. Do __2__ sessions per day.  http://orth.exer.us/6   Copyright  VHI. All rights reserved.

## 2019-08-19 ENCOUNTER — Encounter (HOSPITAL_COMMUNITY): Payer: 59

## 2019-08-20 ENCOUNTER — Ambulatory Visit (HOSPITAL_COMMUNITY): Payer: 59 | Admitting: Physical Therapy

## 2019-08-20 ENCOUNTER — Encounter (HOSPITAL_COMMUNITY): Payer: Self-pay | Admitting: Physical Therapy

## 2019-08-20 ENCOUNTER — Other Ambulatory Visit: Payer: Self-pay

## 2019-08-20 DIAGNOSIS — M25671 Stiffness of right ankle, not elsewhere classified: Secondary | ICD-10-CM | POA: Diagnosis not present

## 2019-08-20 DIAGNOSIS — R2689 Other abnormalities of gait and mobility: Secondary | ICD-10-CM

## 2019-08-20 DIAGNOSIS — M25571 Pain in right ankle and joints of right foot: Secondary | ICD-10-CM | POA: Diagnosis not present

## 2019-08-20 DIAGNOSIS — S82851A Displaced trimalleolar fracture of right lower leg, initial encounter for closed fracture: Secondary | ICD-10-CM | POA: Diagnosis not present

## 2019-08-20 DIAGNOSIS — M6281 Muscle weakness (generalized): Secondary | ICD-10-CM | POA: Diagnosis not present

## 2019-08-20 NOTE — Therapy (Signed)
[facility] [address] [place], [place], [address] Phone: [phone]   Fax:  [phone]  Physical Therapy Treatment  Patient Details  Name: Laura [name] MRN: [id number] Date of Birth: 06-06-1966 Referring Provider (PT): Victorino December   Encounter Date: 08/20/2019  PT End of Session - 08/20/19 1351    Visit Number  3    Number of Visits  24    Date for PT Re-Evaluation  10/14/19    Authorization Type  UMR    PT Start Time  1315    PT Stop Time  1353    PT Time Calculation (min)  38 min    Activity Tolerance  Patient limited by pain    Behavior During Therapy  [facility] for tasks assessed/performed       Past Medical History:  Diagnosis Date  . Ankle fracture    Right  . ASCUS favor benign 2004   colposcopy negative  . Vertigo    on occasion     Past Surgical History:  Procedure Laterality Date  . ABDOMINAL HYSTERECTOMY  01/2010   Leiomyomata  . CESAREAN SECTION  1991   x1  . COLONOSCOPY N/A 09/03/2014   Procedure: COLONOSCOPY;  Surgeon: Rogene Houston, MD;  [facility];  Service: Endoscopy;  Laterality: N/A;  830  . COLPOSCOPY    . ORIF ANKLE FRACTURE Right 07/17/2019   Procedure: OPEN REDUCTION INTERNAL FIXATION (ORIF) ANKLE FRACTURE;  Surgeon: Nicholes Stairs, MD;  Location: [facility];  Service: Orthopedics;  Laterality: Right;  90 mins  . TUBAL LIGATION      There were no vitals filed for this visit.  Subjective Assessment - 08/20/19 1314    Subjective  PT states that she is not taking any pain medication now.    Pertinent History  unremarkable    Limitations  Sitting;Lifting;Standing;Walking;House hold activities    How long can you sit comfortably?  30    How long can you stand comfortably?  10 minutes putting all wt on Lt LE    How long can you walk comfortably?  using crutches NWB has only walked about 50 ft at a time    Patient Stated Goals  drive, walk without crutches or boot, bike    Pain  Onset  1 to 4 weeks ago         [facility] PT Assessment - 08/20/19 0001      AROM   Right Ankle Dorsiflexion  -12    Right Ankle Plantar Flexion  52                   [facility] Adult PT Treatment/Exercise - 08/20/19 0001      Exercises   Exercises  Ankle      Manual Therapy   Manual Therapy  Edema management;Soft tissue mobilization    Manual therapy comments  done seperate from all other aspects of treatment.     Soft tissue mobilization  LE was cleansed , moisturized followed by retromassage to decrease swelling.       Ankle Exercises: Seated   ABC's  1 rep    Heel Raises  Right;10 reps    Toe Raise  10 reps    BAPS  Sitting;Level 2;5 reps    Heel Slides  10 reps      Ankle Exercises: Supine   Isometrics  All x 10     Other Supine Ankle Exercises  Dorsi/plantarflexion x 10/ in eversion  x 10; circles x 10                PT Short Term Goals - 08/18/19 1631      PT SHORT TERM GOAL #1   Title  PT ROM in Rt LE is wnl to allow normal gait    Time  4    Period  Weeks    Status  On-going    Target Date  09/12/19      PT SHORT TERM GOAL #2   Title  Pt to be ambulating without  assistive device and without walking boot    Time  4    Period  Weeks    Status  On-going      PT SHORT TERM GOAL #3   Title  PT pain to be no greater than a 5/10 to allow pt to walk for up to 40 minutes at a time to be able to complete shopping/ housework activity.    Time  4    Period  Weeks    Status  On-going        PT Long Term Goals - 08/18/19 1631      PT LONG TERM GOAL #1   Title  Pt strength of LE mm to be at least 4+/5 to allow pt to be able to go up and down 2 flights of steps with one hand rail assist without increased pain.    Time  8    Period  Weeks    Status  On-going      PT LONG TERM GOAL #2   Title  PT pain to be no greater than a 2/10 to allow pt to ambulate/ stand for 2 hours at a time to be ready for RTW duties.    Time  8    Period  Weeks     Status  On-going      PT LONG TERM GOAL #3   Title  PT to be able to single leg stance on both LE for at least 45 seconds to allow pt to be confident walking  up/down hills on uneven terrain.    Time  8    Period  Weeks    Status  On-going            Plan - 08/20/19 1352    Clinical Impression Statement  Added new exercises with good technique.  Pt has improved in ROM but still has a significant limitation in all directions.  Noted decreased edema.    Examination-Activity Limitations  Bathing;Bed Mobility;Caring for Others;Carry;Dressing;Lift;Locomotion Level;Sit;Sleep;Squat;Stairs;Stand;Toileting;Transfers    Examination-Participation Restrictions  Church;Cleaning;Community Activity;Driving;Interpersonal Relationship;Laundry;Meal Prep;Shop;Yard Work    Stability/Clinical Decision Making  Stable/Uncomplicated    Rehab Potential  Good    PT Frequency  3x / week    PT Duration  8 weeks    PT Treatment/Interventions  ADLs/Self Care Home Management;Cryotherapy;Electrical Stimulation;DME Instruction;Gait training;Stair training;Functional mobility training;Therapeutic activities;Therapeutic exercise;Balance training;Patient/family education;Manual techniques;Manual lymph drainage;Passive range of motion;Scar mobilization;Dry needling    PT Next Visit Plan  instruct in stair climbing. ,; sidelying in/eversion, prone plantarflexion then gradually advance to B and unilateral strengthening and balance.    PT Home Exercise Plan  ROM, Circles, and isometrics for dorsiflexion/plantarflexion       Patient will benefit from skilled therapeutic intervention in order to improve the following deficits and impairments:  Abnormal gait, Decreased activity tolerance, Decreased balance, Decreased endurance, Decreased knowledge of use of DME, Decreased mobility, Decreased range of motion, Decreased strength, Difficulty  walking, Increased edema, Increased fascial restricitons, Pain  Visit  Diagnosis: Stiffness of right ankle, not elsewhere classified  Closed right trimalleolar fracture, initial encounter  Other abnormalities of gait and mobility  Muscle weakness (generalized)     Problem List Patient Active Problem List   Diagnosis Date Noted  . Closed right trimalleolar fracture, initial encounter 07/17/2019    Rayetta Humphrey, PT CLT [phone] 08/20/2019, 1:57 PM  [facility] [address] [place], [place], [address] Phone: [phone]   Fax:  [phone]  Name: Laura [name] MRN: [id number] Date of Birth: 07-Dec-1965

## 2019-08-21 ENCOUNTER — Other Ambulatory Visit: Payer: Self-pay | Admitting: Internal Medicine

## 2019-08-21 ENCOUNTER — Encounter (HOSPITAL_COMMUNITY): Payer: 59

## 2019-08-21 DIAGNOSIS — E2839 Other primary ovarian failure: Secondary | ICD-10-CM

## 2019-08-22 ENCOUNTER — Ambulatory Visit (HOSPITAL_COMMUNITY): Payer: 59 | Admitting: Physical Therapy

## 2019-08-22 ENCOUNTER — Encounter (HOSPITAL_COMMUNITY): Payer: Self-pay | Admitting: Physical Therapy

## 2019-08-22 ENCOUNTER — Other Ambulatory Visit: Payer: Self-pay

## 2019-08-22 DIAGNOSIS — M25571 Pain in right ankle and joints of right foot: Secondary | ICD-10-CM | POA: Diagnosis not present

## 2019-08-22 DIAGNOSIS — M25671 Stiffness of right ankle, not elsewhere classified: Secondary | ICD-10-CM

## 2019-08-22 DIAGNOSIS — M6281 Muscle weakness (generalized): Secondary | ICD-10-CM | POA: Diagnosis not present

## 2019-08-22 DIAGNOSIS — S82851A Displaced trimalleolar fracture of right lower leg, initial encounter for closed fracture: Secondary | ICD-10-CM | POA: Diagnosis not present

## 2019-08-22 DIAGNOSIS — R2689 Other abnormalities of gait and mobility: Secondary | ICD-10-CM | POA: Diagnosis not present

## 2019-08-22 NOTE — Therapy (Signed)
[facility] [address] [place], [place], [address] Phone: [phone]   Fax:  [phone]  Physical Therapy Treatment  Patient Details  Name: Laura [name] MRN: [id number] Date of Birth: [birth year] Referring Provider (PT): Victorino December   Encounter Date: 08/22/2019  PT End of Session - 08/22/19 1227    Visit Number  4    Number of Visits  24    Date for PT Re-Evaluation  10/14/19    Authorization Type  UMR    PT Start Time  1135    PT Stop Time  1220    PT Time Calculation (min)  45 min    Equipment Utilized During Treatment  Gait belt    Activity Tolerance  Patient limited by pain    Behavior During Therapy  [facility] for tasks assessed/performed       Past Medical History:  Diagnosis Date  . Ankle fracture    Right  . ASCUS favor benign 2004   colposcopy negative  . Vertigo    on occasion     Past Surgical History:  Procedure Laterality Date  . ABDOMINAL HYSTERECTOMY  01/2010   Leiomyomata  . CESAREAN SECTION  1991   x1  . COLONOSCOPY N/A 09/03/2014   Procedure: COLONOSCOPY;  Surgeon: Rogene Houston, MD;  [facility];  Service: Endoscopy;  Laterality: N/A;  830  . COLPOSCOPY    . ORIF ANKLE FRACTURE Right 07/17/2019   Procedure: OPEN REDUCTION INTERNAL FIXATION (ORIF) ANKLE FRACTURE;  Surgeon: Nicholes Stairs, MD;  Location: [facility];  Service: Orthopedics;  Laterality: Right;  90 mins  . TUBAL LIGATION      There were no vitals filed for this visit.  Subjective Assessment - 08/22/19 1152    Subjective  Pt states she slept on her side yesterday and not with the leg elevated and she can tell that her leg is more swollen    Pertinent History  unremarkable    Limitations  Sitting;Lifting;Standing;Walking;House hold activities    How long can you sit comfortably?  30    How long can you stand comfortably?  10 minutes putting all wt on Lt LE    How long can you walk comfortably?  using crutches NWB has  only walked about 50 ft at a time    Patient Stated Goals  drive, walk without crutches or boot, bike    Currently in Pain?  Yes    Pain Score  5     Pain Location  Foot    Pain Orientation  Right    Pain Descriptors / Indicators  Tightness    Pain Type  Acute pain    Pain Onset  1 to 4 weeks ago                       [facility] Adult PT Treatment/Exercise - 08/22/19 0001      Ambulation/Gait   Stairs  Yes    Stairs Assistance  6: Modified independent (Device/Increase time)    Stair Management Technique  Other (comment)    Number of Stairs  10    Height of Stairs  7    Gait Comments  attempted gt with one crutch but pt is not ready yet       Exercises   Exercises  Ankle      Manual Therapy   Manual Therapy  Edema management;Soft tissue mobilization    Manual therapy comments  done seperate from all other aspects of treatment.     Edema Management  retro massage to decrease swelling       Ankle Exercises: Seated   Towel Crunch  1 rep    Towel Inversion/Eversion  1 rep    Heel Raises  Right;10 reps    Toe Raise  10 reps    BAPS  Sitting;Level 2;10 reps             PT Education - 08/22/19 1226    Education Details  new HEP    Person(s) Educated  Patient    Methods  Explanation;Handout    Comprehension  Verbalized understanding;Returned demonstration       PT Short Term Goals - 08/18/19 1631      PT SHORT TERM GOAL #1   Title  PT ROM in Rt LE is wnl to allow normal gait    Time  4    Period  Weeks    Status  On-going    Target Date  09/12/19      PT SHORT TERM GOAL #2   Title  Pt to be ambulating without  assistive device and without walking boot    Time  4    Period  Weeks    Status  On-going      PT SHORT TERM GOAL #3   Title  PT pain to be no greater than a 5/10 to allow pt to walk for up to 40 minutes at a time to be able to complete shopping/ housework activity.    Time  4    Period  Weeks    Status  On-going        PT Long Term  Goals - 08/18/19 1631      PT LONG TERM GOAL #1   Title  Pt strength of LE mm to be at least 4+/5 to allow pt to be able to go up and down 2 flights of steps with one hand rail assist without increased pain.    Time  8    Period  Weeks    Status  On-going      PT LONG TERM GOAL #2   Title  PT pain to be no greater than a 2/10 to allow pt to ambulate/ stand for 2 hours at a time to be ready for RTW duties.    Time  8    Period  Weeks    Status  On-going      PT LONG TERM GOAL #3   Title  PT to be able to single leg stance on both LE for at least 45 seconds to allow pt to be confident walking  up/down hills on uneven terrain.    Time  8    Period  Weeks    Status  On-going            Plan - 08/22/19 1227    Clinical Impression Statement  Trained pt how to go up and down steps using crutches as she was going up and down on her bottom.  Attempted single crutch ambulation without success.  Added towel in/eversion and towel crunch exercises to program for improved ROM and intrinsic strength.    Examination-Activity Limitations  Bathing;Bed Mobility;Caring for Others;Carry;Dressing;Lift;Locomotion Level;Sit;Sleep;Squat;Stairs;Stand;Toileting;Transfers    Examination-Participation Restrictions  Church;Cleaning;Community Activity;Driving;Interpersonal Relationship;Laundry;Meal Prep;Shop;Yard Work    Stability/Clinical Decision Making  Stable/Uncomplicated    Rehab Potential  Good    PT Frequency  3x / week    PT Duration  8 weeks    PT Treatment/Interventions  ADLs/Self Care Home Management;Cryotherapy;Electrical Stimulation;DME Instruction;Gait training;Stair training;Functional mobility training;Therapeutic activities;Therapeutic exercise;Balance training;Patient/family education;Manual techniques;Manual lymph drainage;Passive range of motion;Scar mobilization;Dry needling    PT Next Visit Plan  sidelying in/eversion, prone plantarflexion then gradually advance to B and unilateral  strengthening and balance.    PT Home Exercise Plan  ROM, Circles, and isometrics for dorsiflexion/plantarflexion       Patient will benefit from skilled therapeutic intervention in order to improve the following deficits and impairments:  Abnormal gait, Decreased activity tolerance, Decreased balance, Decreased endurance, Decreased knowledge of use of DME, Decreased mobility, Decreased range of motion, Decreased strength, Difficulty walking, Increased edema, Increased fascial restricitons, Pain  Visit Diagnosis: Stiffness of right ankle, not elsewhere classified  Muscle weakness (generalized)  Other abnormalities of gait and mobility  Pain in right ankle and joints of right foot     Problem List Patient Active Problem List   Diagnosis Date Noted  . Closed right trimalleolar fracture, initial encounter 07/17/2019    Rayetta Humphrey, PT CLT [phone] 08/22/2019, 12:31 PM  [facility] [address] [place], [place], [address] Phone: [phone]   Fax:  [phone]  Name: Laura [name] MRN: [id number] Date of Birth: 12/14/1965

## 2019-08-22 NOTE — Patient Instructions (Addendum)
Toe Raise (Sitting)    Raise toes, keeping heels on floor. Repeat _10___ times per set. Do ___1_ sets per session. Do _2___ sessions per day.  http://orth.exer.us/46   Copyright  VHI. All rights reserved.  Heel Raise (Sitting)    Raise heels, keeping toes on floor. Repeat _10___ times per set. Do _1___ sets per session. Do __2__ sessions per day.  http://orth.exer.us/44   Copyright  VHI. All rights reserved.  Toe Curl: Unilateral    With right foot resting on towel, slowly bunch up towel by curling toes. Repeat _15___ times per set. Do ____ sets per session. Do ____ sessions per day.  http://orth.exer.us/18   Copyright  VHI. All rights reserved.  AROM: Toe Curl    Sitting or lying with left heel supported, gently curl and straighten toes. Repeat _10___ times per set. Do _1___ sets per session. Do __2__ sessions per day.  http://orth.exer.us/60   Copyright  VHI. All rights reserved.

## 2019-08-25 ENCOUNTER — Ambulatory Visit (HOSPITAL_COMMUNITY): Payer: 59 | Admitting: Physical Therapy

## 2019-08-25 ENCOUNTER — Encounter (HOSPITAL_COMMUNITY): Payer: Self-pay | Admitting: Physical Therapy

## 2019-08-25 ENCOUNTER — Other Ambulatory Visit: Payer: Self-pay

## 2019-08-25 DIAGNOSIS — R2689 Other abnormalities of gait and mobility: Secondary | ICD-10-CM | POA: Diagnosis not present

## 2019-08-25 DIAGNOSIS — S82851A Displaced trimalleolar fracture of right lower leg, initial encounter for closed fracture: Secondary | ICD-10-CM | POA: Diagnosis not present

## 2019-08-25 DIAGNOSIS — M25671 Stiffness of right ankle, not elsewhere classified: Secondary | ICD-10-CM | POA: Diagnosis not present

## 2019-08-25 DIAGNOSIS — M25571 Pain in right ankle and joints of right foot: Secondary | ICD-10-CM

## 2019-08-25 DIAGNOSIS — M6281 Muscle weakness (generalized): Secondary | ICD-10-CM | POA: Diagnosis not present

## 2019-08-25 NOTE — Patient Instructions (Addendum)
Functional Quadriceps: Chair Squat   Place a 1/4 to a 1/2 inch book under your left foot.  At the kitchen counter Keeping feet flat on floor, shoulder width apart, squat as low as is comfortable. Use support as necessary. Repeat _10-15___ times per set. Do __1__ sets per session. Do __2__ sessions per day.  http://orth.exer.us/736   Copyright  VHI. All rights reserved.

## 2019-08-25 NOTE — Therapy (Signed)
[facility] [address] [place], [place], [address] Phone: [phone]   Fax:  [phone]  Physical Therapy Treatment  Patient Details  Name: Laura [name] MRN: [id number] Date of Birth: Jun 30, 1966 Referring Provider (PT): Victorino December   Encounter Date: 08/25/2019  PT End of Session - 08/25/19 1104    Visit Number  5    Number of Visits  24    Date for PT Re-Evaluation  10/14/19    Authorization Type  UMR    PT Start Time  0918    PT Stop Time  1004    PT Time Calculation (min)  46 min    Equipment Utilized During Treatment  Gait belt    Activity Tolerance  Patient limited by pain    Behavior During Therapy  [facility] for tasks assessed/performed       Past Medical History:  Diagnosis Date  . Ankle fracture    Right  . ASCUS favor benign 2004   colposcopy negative  . Vertigo    on occasion     Past Surgical History:  Procedure Laterality Date  . ABDOMINAL HYSTERECTOMY  01/2010   Leiomyomata  . CESAREAN SECTION  1991   x1  . COLONOSCOPY N/A 09/03/2014   Procedure: COLONOSCOPY;  Surgeon: Rogene Houston, MD;  [facility];  Service: Endoscopy;  Laterality: N/A;  830  . COLPOSCOPY    . ORIF ANKLE FRACTURE Right 07/17/2019   Procedure: OPEN REDUCTION INTERNAL FIXATION (ORIF) ANKLE FRACTURE;  Surgeon: Nicholes Stairs, MD;  Location: [facility];  Service: Orthopedics;  Laterality: Right;  90 mins  . TUBAL LIGATION      There were no vitals filed for this visit.  Subjective Assessment - 08/25/19 0918    Subjective  Pt states that she did her exercises and feels like her toe strength is getting better.    Pertinent History  unremarkable    Limitations  Sitting;Lifting;Standing;Walking;House hold activities    How long can you sit comfortably?  30    How long can you stand comfortably?  10 minutes putting all wt on Lt LE    How long can you walk comfortably?  using crutches NWB has only walked about 50 ft at a  time    Patient Stated Goals  drive, walk without crutches or boot, bike    Currently in Pain?  No/denies    Pain Onset  1 to 4 weeks ago           [facility] Adult PT Treatment/Exercise - 08/25/19 0001      Ambulation/Gait   Ambulation/Gait  Yes    Ambulation/Gait Assistance  4: Min assist    Ambulation/Gait Assistance Details  one curtch    Ambulation Distance (Feet)  --   60     Exercises   Exercises  Ankle      Manual Therapy   Manual Therapy  --    Manual therapy comments  --    Edema Management  --      Ankle Exercises: Seated   Towel Crunch  1 rep    Towel Inversion/Eversion  1 rep    Heel Raises  Right;10 reps    Toe Raise  10 reps    BAPS  Sitting;Level 3;10 reps    Heel Slides  10 reps      Ankle Exercises: Supine   Isometrics  All x 10  PT Short Term Goals - 08/18/19 1631      PT SHORT TERM GOAL #1   Title  PT ROM in Rt LE is wnl to allow normal gait    Time  4    Period  Weeks    Status  On-going    Target Date  09/12/19      PT SHORT TERM GOAL #2   Title  Pt to be ambulating without  assistive device and without walking boot    Time  4    Period  Weeks    Status  On-going      PT SHORT TERM GOAL #3   Title  PT pain to be no greater than a 5/10 to allow pt to walk for up to 40 minutes at a time to be able to complete shopping/ housework activity.    Time  4    Period  Weeks    Status  On-going        PT Long Term Goals - 08/18/19 1631      PT LONG TERM GOAL #1   Title  Pt strength of LE mm to be at least 4+/5 to allow pt to be able to go up and down 2 flights of steps with one hand rail assist without increased pain.    Time  8    Period  Weeks    Status  On-going      PT LONG TERM GOAL #2   Title  PT pain to be no greater than a 2/10 to allow pt to ambulate/ stand for 2 hours at a time to be ready for RTW duties.    Time  8    Period  Weeks    Status  On-going      PT LONG TERM GOAL #3   Title  PT to be able  to single leg stance on both LE for at least 45 seconds to allow pt to be confident walking  up/down hills on uneven terrain.    Time  8    Period  Weeks    Status  On-going            Plan - 08/25/19 1104    Clinical Impression Statement  This therapy focused on gt training with one crutch, (this needs reinforcement), as well as wt shifting at counter to allow pt to feel more confident putting greater wt onto her LE.  Time prevented manual from being completed today.    Examination-Activity Limitations  Bathing;Bed Mobility;Caring for Others;Carry;Dressing;Lift;Locomotion Level;Sit;Sleep;Squat;Stairs;Stand;Toileting;Transfers    Examination-Participation Restrictions  Church;Cleaning;Community Activity;Driving;Interpersonal Relationship;Laundry;Meal Prep;Shop;Yard Work    Stability/Clinical Decision Making  Stable/Uncomplicated    Rehab Potential  Good    PT Frequency  3x / week    PT Duration  8 weeks    PT Treatment/Interventions  ADLs/Self Care Home Management;Cryotherapy;Electrical Stimulation;DME Instruction;Gait training;Stair training;Functional mobility training;Therapeutic activities;Therapeutic exercise;Balance training;Patient/family education;Manual techniques;Manual lymph drainage;Passive range of motion;Scar mobilization;Dry needling    PT Next Visit Plan  Need to go over gt training with one crutch as pt has not mastered this.  Begin sidelying in/eversion, prone plantarflexion then gradually advance to B and unilateral strengthening and balance.    PT Home Exercise Plan  ROM, Circles, and isometrics for dorsiflexion/plantarflexion       Patient will benefit from skilled therapeutic intervention in order to improve the following deficits and impairments:  Abnormal gait, Decreased activity tolerance, Decreased balance, Decreased endurance, Decreased knowledge of use of DME, Decreased mobility,  Decreased range of motion, Decreased strength, Difficulty walking, Increased edema,  Increased fascial restricitons, Pain  Visit Diagnosis: Stiffness of right ankle, not elsewhere classified  Muscle weakness (generalized)  Other abnormalities of gait and mobility  Pain in right ankle and joints of right foot     Problem List Patient Active Problem List   Diagnosis Date Noted  . Closed right trimalleolar fracture, initial encounter 07/17/2019  Rayetta Humphrey, PT CLT [phone] 08/25/2019, 11:08 AM  [facility] [place], [place], [address] Phone: [phone]   Fax:  [phone]  Name: Laura [name] MRN: [id number] Date of Birth: [birth year]

## 2019-08-26 ENCOUNTER — Encounter (HOSPITAL_COMMUNITY): Payer: 59 | Admitting: Physical Therapy

## 2019-08-27 ENCOUNTER — Encounter: Payer: Self-pay | Admitting: Gynecology

## 2019-08-28 ENCOUNTER — Other Ambulatory Visit: Payer: Self-pay

## 2019-08-28 ENCOUNTER — Ambulatory Visit (HOSPITAL_COMMUNITY): Payer: 59 | Admitting: Physical Therapy

## 2019-08-28 DIAGNOSIS — M25571 Pain in right ankle and joints of right foot: Secondary | ICD-10-CM

## 2019-08-28 DIAGNOSIS — S82851A Displaced trimalleolar fracture of right lower leg, initial encounter for closed fracture: Secondary | ICD-10-CM | POA: Diagnosis not present

## 2019-08-28 DIAGNOSIS — R2689 Other abnormalities of gait and mobility: Secondary | ICD-10-CM | POA: Diagnosis not present

## 2019-08-28 DIAGNOSIS — M25671 Stiffness of right ankle, not elsewhere classified: Secondary | ICD-10-CM | POA: Diagnosis not present

## 2019-08-28 DIAGNOSIS — M6281 Muscle weakness (generalized): Secondary | ICD-10-CM | POA: Diagnosis not present

## 2019-08-28 NOTE — Patient Instructions (Addendum)
PRE: Eversion (Side-Lying)    With _0___ pound weight around right foot, big toe down, bend ankle up and turn foot out. Repeat _10___ times per set. Do __1__ sets per session. Do ___2_ sessions per day.  http://orth.exer.us/58   Copyright  VHI. All rights reserved.  PRE: Inversion (Side-Lying)    With __0__ pound weight around left foot, big toe up, bend ankle up and turn foot in. Repeat _10___ times per set. Do __1_ sets per session. Do __2__ sessions per day.  http://orth.exer.us/56   Copyright  VHI. All rights reserved.  PRE: Plantar Flexion - Knee Flexed (Prone)    Lying on stomach with right knee bent and _1___ pound weight around foot, point foot toward ceiling. Repeat _10___ times per set. Do _1___ sets per session. Do _2___ sessions per day.  http://orth.exer.us/54   Copyright  VHI. All rights reserved.

## 2019-08-28 NOTE — Therapy (Signed)
[facility] [address] [place], [place], [address] Phone: [phone]   Fax:  [phone]  Physical Therapy Treatment  Patient Details  Name: Laura [name] MRN: [id number] Date of Birth: May 20, 1966 Referring Provider (PT): Victorino December   Encounter Date: 08/28/2019  PT End of Session - 08/28/19 1509    Visit Number  6    Number of Visits  24    Date for PT Re-Evaluation  10/14/19    Authorization Type  UMR    PT Start Time  1010    PT Stop Time  1055    PT Time Calculation (min)  45 min    Equipment Utilized During Treatment  Gait belt    Activity Tolerance  Patient limited by pain    Behavior During Therapy  [facility] for tasks assessed/performed       Past Medical History:  Diagnosis Date  . Ankle fracture    Right  . ASCUS favor benign 2004   colposcopy negative  . Vertigo    on occasion     Past Surgical History:  Procedure Laterality Date  . ABDOMINAL HYSTERECTOMY  01/2010   Leiomyomata  . CESAREAN SECTION  1991   x1  . COLONOSCOPY N/A 09/03/2014   Procedure: COLONOSCOPY;  Surgeon: Rogene Houston, MD;  [facility];  Service: Endoscopy;  Laterality: N/A;  830  . COLPOSCOPY    . ORIF ANKLE FRACTURE Right 07/17/2019   Procedure: OPEN REDUCTION INTERNAL FIXATION (ORIF) ANKLE FRACTURE;  Surgeon: Nicholes Stairs, MD;  Location: [facility];  Service: Orthopedics;  Laterality: Right;  90 mins  . TUBAL LIGATION      There were no vitals filed for this visit.  Subjective Assessment - 08/28/19 1026    Subjective  Pt states that she still has not mastered walking with one crutch    Pertinent History  unremarkable    Limitations  Sitting;Lifting;Standing;Walking;House hold activities    How long can you sit comfortably?  30    How long can you stand comfortably?  10 minutes putting all wt on Lt LE    How long can you walk comfortably?  using crutches NWB has only walked about 50 ft at a time    Patient Stated  Goals  drive, walk without crutches or boot, bike    Currently in Pain?  Yes    Pain Score  5     Pain Location  Ankle    Pain Orientation  Right    Pain Descriptors / Indicators  Tightness    Pain Type  Acute pain    Pain Onset  1 to 4 weeks ago         [facility] PT Assessment - 08/28/19 0001      AROM   Right Ankle Dorsiflexion  -12    Right Ankle Plantar Flexion  60    Right Ankle Inversion  18    Right Ankle Eversion  10                   [facility] Adult PT Treatment/Exercise - 08/28/19 0001      Ambulation/Gait   Ambulation/Gait  Yes    Ambulation/Gait Assistance  4: Min assist    Ambulation Distance (Feet)  --   Attempted but pt is to nervous this session     Exercises   Exercises  Ankle      Ankle Exercises: Seated   Towel Crunch  1 rep  Towel Inversion/Eversion  --    Heel Raises  Right;10 reps    Toe Raise  10 reps    BAPS  Sitting;Level 3;10 reps    Heel Slides  --    Other Seated Ankle Exercises  PROM for ankle and  toe       Ankle Exercises: Supine   Isometrics  --      Ankle Exercises: Sidelying   Ankle Inversion Weights (lbs)  10 x no wt   Ankle Eversion Weights (lbs)  10 x no wt    Other Sidelying Ankle Exercises  prone Dorsi/plantarflexion x 10              PT Education - 08/28/19 1508    Education Details  updated new HEP    Person(s) Educated  Patient    Methods  Explanation;Verbal cues;Handout    Comprehension  Verbalized understanding;Returned demonstration       PT Short Term Goals - 08/18/19 1631      PT SHORT TERM GOAL #1   Title  PT ROM in Rt LE is wnl to allow normal gait    Time  4    Period  Weeks    Status  On-going    Target Date  09/12/19      PT SHORT TERM GOAL #2   Title  Pt to be ambulating without  assistive device and without walking boot    Time  4    Period  Weeks    Status  On-going      PT SHORT TERM GOAL #3   Title  PT pain to be no greater than a 5/10 to allow pt to walk for up to 40  minutes at a time to be able to complete shopping/ housework activity.    Time  4    Period  Weeks    Status met        PT Long Term Goals - 08/18/19 1631      PT LONG TERM GOAL #1   Title  Pt strength of LE mm to be at least 4+/5 to allow pt to be able to go up and down 2 flights of steps with one hand rail assist without increased pain.    Time  8    Period  Weeks    Status  On-going      PT LONG TERM GOAL #2   Title  PT pain to be no greater than a 2/10 to allow pt to ambulate/ stand for 2 hours at a time to be ready for RTW duties.    Time  8    Period  Weeks    Status  On-going      PT LONG TERM GOAL #3   Title  PT to be able to single leg stance on both LE for at least 45 seconds to allow pt to be confident walking  up/down hills on uneven terrain.    Time  8    Period  Weeks    Status  On-going            Plan - 08/28/19 1509    Clinical Impression Statement  Pt measured today with gains in inversion,eversion and plantarflexion but no improvement in dorsiflexion, therefore therapist began PROM and instructed pt in PROM as well.  Added sidelying inversion /eversion to promote ankle stability.    Examination-Activity Limitations  Bathing;Bed Mobility;Caring for Others;Carry;Dressing;Lift;Locomotion Level;Sit;Sleep;Squat;Stairs;Stand;Toileting;Transfers    Examination-Participation Restrictions  Church;Cleaning;Community Activity;Driving;Interpersonal Relationship;Laundry;Meal Prep;Shop;Yard Work  Stability/Clinical Decision Making  Stable/Uncomplicated    Rehab Potential  Good    PT Frequency  3x / week    PT Duration  8 weeks    PT Treatment/Interventions  ADLs/Self Care Home Management;Cryotherapy;Electrical Stimulation;DME Instruction;Gait training;Stair training;Functional mobility training;Therapeutic activities;Therapeutic exercise;Balance training;Patient/family education;Manual techniques;Manual lymph drainage;Passive range of motion;Scar mobilization;Dry  needling    PT Next Visit Plan  requested that pt brings the mate to her other tennis shoe, have pt put shoes on and  gradually advance to B  strengthening and balance.    PT Home Exercise Plan  ROM, Circles, and isometrics for dorsiflexion/plantarflexion, ABC, towel crunches, sidelying in/eversion, prone DF/PF>       Patient will benefit from skilled therapeutic intervention in order to improve the following deficits and impairments:  Abnormal gait, Decreased activity tolerance, Decreased balance, Decreased endurance, Decreased knowledge of use of DME, Decreased mobility, Decreased range of motion, Decreased strength, Difficulty walking, Increased edema, Increased fascial restricitons, Pain  Visit Diagnosis: Stiffness of right ankle, not elsewhere classified  Muscle weakness (generalized)  Other abnormalities of gait and mobility  Pain in right ankle and joints of right foot  Closed right trimalleolar fracture, initial encounter     Problem List Patient Active Problem List   Diagnosis Date Noted  . Closed right trimalleolar fracture, initial encounter 07/17/2019   Rayetta Humphrey, PT CLT [phone] 08/28/2019, 3:14 PM  [facility] [place], [place], [address] Phone: [phone]   Fax:  [phone]  Name: Laura [name] MRN: [id number] Date of Birth: 1966-01-23

## 2019-08-29 ENCOUNTER — Ambulatory Visit (HOSPITAL_COMMUNITY): Payer: 59 | Admitting: Physical Therapy

## 2019-08-29 ENCOUNTER — Encounter (HOSPITAL_COMMUNITY): Payer: Self-pay | Admitting: Physical Therapy

## 2019-08-29 DIAGNOSIS — M6281 Muscle weakness (generalized): Secondary | ICD-10-CM

## 2019-08-29 DIAGNOSIS — M25671 Stiffness of right ankle, not elsewhere classified: Secondary | ICD-10-CM

## 2019-08-29 DIAGNOSIS — M25571 Pain in right ankle and joints of right foot: Secondary | ICD-10-CM | POA: Diagnosis not present

## 2019-08-29 DIAGNOSIS — S82851A Displaced trimalleolar fracture of right lower leg, initial encounter for closed fracture: Secondary | ICD-10-CM | POA: Diagnosis not present

## 2019-08-29 DIAGNOSIS — R2689 Other abnormalities of gait and mobility: Secondary | ICD-10-CM

## 2019-08-29 NOTE — Patient Instructions (Addendum)
Gastroc Stretch    Stand with right foot back, leg straight, forward leg bent. Keeping heel on floor, turned slightly out, lean into wall until stretch is felt in calf. Hold ___30_ seconds. Repeat _3___ times per set. Do ___1_ sets per session. Do __2__ sessions per day.  http://orth.exer.us/26   Copyright  VHI. All rights reserved.

## 2019-08-29 NOTE — Therapy (Signed)
[facility] [address] [place], [place], [address] Phone: [phone]   Fax:  [phone]  Physical Therapy Treatment  Patient Details  Name: Laura [name] MRN: [id number] Date of Birth: 16-Nov-1966 Referring Provider (PT): Victorino December   Encounter Date: 08/29/2019  PT End of Session - 08/29/19 1444    Visit Number  7    Number of Visits  24    Date for PT Re-Evaluation  10/14/19    Authorization Type  UMR    PT Start Time  1355    PT Stop Time  1440    PT Time Calculation (min)  45 min    Equipment Utilized During Treatment  Gait belt    Activity Tolerance  Patient limited by pain    Behavior During Therapy  [facility] for tasks assessed/performed       Past Medical History:  Diagnosis Date  . Ankle fracture    Right  . ASCUS favor benign 2004   colposcopy negative  . Vertigo    on occasion     Past Surgical History:  Procedure Laterality Date  . ABDOMINAL HYSTERECTOMY  01/2010   Leiomyomata  . CESAREAN SECTION  1991   x1  . COLONOSCOPY N/A 09/03/2014   Procedure: COLONOSCOPY;  Surgeon: Rogene Houston, MD;  [facility];  Service: Endoscopy;  Laterality: N/A;  830  . COLPOSCOPY    . ORIF ANKLE FRACTURE Right 07/17/2019   Procedure: OPEN REDUCTION INTERNAL FIXATION (ORIF) ANKLE FRACTURE;  Surgeon: Nicholes Stairs, MD;  Location: [facility];  Service: Orthopedics;  Laterality: Right;  90 mins  . TUBAL LIGATION      There were no vitals filed for this visit.  Subjective Assessment - 08/29/19 1355    Subjective  I actually stood and washed dishes yesterday, I got a little tired but was okay.    Pertinent History  unremarkable    Limitations  Sitting;Lifting;Standing;Walking;House hold activities    How long can you sit comfortably?  30    How long can you stand comfortably?  10 minutes putting all wt on Lt LE    How long can you walk comfortably?  using crutches NWB has only walked about 50 ft at a time    Patient Stated Goals  drive, walk without crutches or boot, bike    Currently in Pain?  Yes    Pain Score  5     Pain Location  Abdomen    Pain Orientation  Right    Pain Descriptors / Indicators  Tightness    Pain Type  Acute pain    Pain Onset  1 to 4 weeks ago    Pain Frequency  Constant    Aggravating Factors   activity    Pain Relieving Factors  elevation and ice    Effect of Pain on Daily Activities  limits                       [facility] Adult PT Treatment/Exercise - 08/29/19 0001      Ambulation/Gait   Ambulation/Gait  --    Ambulation/Gait Assistance  --    Ambulation Distance (Feet)  --      Exercises   Exercises  Ankle      Manual Therapy   Manual Therapy  Edema management;Soft tissue mobilization    Manual therapy comments  done seperate from all other aspects of treatment.     Edema  Management  retro massage to decrease swelling       Ankle Exercises: Stretches   Gastroc Stretch  3 reps;30 seconds      Ankle Exercises: Seated   Towel Crunch  1 rep    Heel Raises  Right;10 reps    Toe Raise  10 reps    BAPS  Sitting;Level 3;10 reps    Other Seated Ankle Exercises  PROM for ankle and  toe       Ankle Exercises: Supine   Other Supine Ankle Exercises  PROM for DF; contract relax to improve DF      Ankle Exercises: Sidelying   Ankle Inversion Weights (lbs)  10    Ankle Eversion Weights (lbs)  10    Other Sidelying Ankle Exercises  prone Dorsi/plantarflexion x 10                PT Short Term Goals - 08/28/19 1516      PT SHORT TERM GOAL #1   Title  PT ROM in Rt LE is wnl to allow normal gait    Time  4    Period  Weeks    Status  On-going    Target Date  09/12/19      PT SHORT TERM GOAL #2   Title  Pt to be ambulating without  assistive device and without walking boot    Time  4    Period  Weeks    Status  On-going      PT SHORT TERM GOAL #3   Title  PT pain to be no greater than a 5/10 to allow pt to walk for up to 40  minutes at a time to be able to complete shopping/ housework activity.    Time  4    Period  Weeks    Status  Achieved        PT Long Term Goals - 08/18/19 1631      PT LONG TERM GOAL #1   Title  Pt strength of LE mm to be at least 4+/5 to allow pt to be able to go up and down 2 flights of steps with one hand rail assist without increased pain.    Time  8    Period  Weeks    Status  On-going      PT LONG TERM GOAL #2   Title  PT pain to be no greater than a 2/10 to allow pt to ambulate/ stand for 2 hours at a time to be ready for RTW duties.    Time  8    Period  Weeks    Status  On-going      PT LONG TERM GOAL #3   Title  PT to be able to single leg stance on both LE for at least 45 seconds to allow pt to be confident walking  up/down hills on uneven terrain.    Time  8    Period  Weeks    Status  On-going            Plan - 08/29/19 1445    Clinical Impression Statement  Added gastoc stretch to routine, pt needed Rt foot placed slightly behind LT with significant coaxing to get pt to straighten her knee and lower her heel to the ground.  Added contract relax to increase DF.  Overall pt continues to improve but will continue to need skilled therapy to progress to walking with no assistive device or camboot.  Examination-Activity Limitations  Bathing;Bed Mobility;Caring for Others;Carry;Dressing;Lift;Locomotion Level;Sit;Sleep;Squat;Stairs;Stand;Toileting;Transfers    Examination-Participation Restrictions  Church;Cleaning;Community Activity;Driving;Interpersonal Relationship;Laundry;Meal Prep;Shop;Yard Work    Stability/Clinical Decision Making  Stable/Uncomplicated    Rehab Potential  Good    PT Frequency  3x / week    PT Duration  8 weeks    PT Treatment/Interventions  ADLs/Self Care Home Management;Cryotherapy;Electrical Stimulation;DME Instruction;Gait training;Stair training;Functional mobility training;Therapeutic activities;Therapeutic exercise;Balance  training;Patient/family education;Manual techniques;Manual lymph drainage;Passive range of motion;Scar mobilization;Dry needling    PT Next Visit Plan  requested that pt brings the mate to her other tennis shoe, have pt put shoes on and  gradually advance to B  strengthening and balance.  Attempt gt with one crutch again.    PT Home Exercise Plan  ROM, Circles, and isometrics for dorsiflexion/plantarflexion, ABC, towel crunches, sidelying in/eversion, prone DF/PF>       Patient will benefit from skilled therapeutic intervention in order to improve the following deficits and impairments:  Abnormal gait, Decreased activity tolerance, Decreased balance, Decreased endurance, Decreased knowledge of use of DME, Decreased mobility, Decreased range of motion, Decreased strength, Difficulty walking, Increased edema, Increased fascial restricitons, Pain  Visit Diagnosis: Stiffness of right ankle, not elsewhere classified  Muscle weakness (generalized)  Other abnormalities of gait and mobility  Pain in right ankle and joints of right foot     Problem List Patient Active Problem List   Diagnosis Date Noted  . Closed right trimalleolar fracture, initial encounter 07/17/2019   Rayetta Humphrey, [place] CLT [phone] 08/29/2019, 2:58 PM  [facility] [place], [place], [address] Phone: [phone]   Fax:  [phone]  Name: Laura [name] MRN: [id number] Date of Birth: 19-Sep-1966

## 2019-09-01 ENCOUNTER — Other Ambulatory Visit: Payer: Self-pay

## 2019-09-01 ENCOUNTER — Encounter (HOSPITAL_COMMUNITY): Payer: Self-pay | Admitting: Physical Therapy

## 2019-09-01 ENCOUNTER — Ambulatory Visit (HOSPITAL_COMMUNITY): Payer: 59 | Admitting: Physical Therapy

## 2019-09-01 DIAGNOSIS — M25671 Stiffness of right ankle, not elsewhere classified: Secondary | ICD-10-CM

## 2019-09-01 DIAGNOSIS — M6281 Muscle weakness (generalized): Secondary | ICD-10-CM

## 2019-09-01 DIAGNOSIS — M25571 Pain in right ankle and joints of right foot: Secondary | ICD-10-CM | POA: Diagnosis not present

## 2019-09-01 DIAGNOSIS — R2689 Other abnormalities of gait and mobility: Secondary | ICD-10-CM | POA: Diagnosis not present

## 2019-09-01 DIAGNOSIS — S82851A Displaced trimalleolar fracture of right lower leg, initial encounter for closed fracture: Secondary | ICD-10-CM | POA: Diagnosis not present

## 2019-09-01 NOTE — Therapy (Signed)
[facility] [place], [place], [address] Phone: [phone]   Fax:  [phone]  Physical Therapy Treatment  Patient Details  Name: Laura [name] MRN: [id number] Date of Birth: Jun 26, 1966 Referring Provider (PT): Victorino December   Encounter Date: 09/01/2019  PT End of Session - 09/01/19 1538    Visit Number  8    Number of Visits  24    Date for PT Re-Evaluation  10/14/19    Authorization Type  UMR    PT Start Time  [id number]    PT Stop Time  1450    PT Time Calculation (min)  38 min    Equipment Utilized During Treatment  Gait belt    Activity Tolerance  Patient limited by pain    Behavior During Therapy  [facility] for tasks assessed/performed       Past Medical History:  Diagnosis Date  . Ankle fracture    Right  . ASCUS favor benign 2004   colposcopy negative  . Vertigo    on occasion     Past Surgical History:  Procedure Laterality Date  . ABDOMINAL HYSTERECTOMY  01/2010   Leiomyomata  . CESAREAN SECTION  1991   x1  . COLONOSCOPY N/A 09/03/2014   Procedure: COLONOSCOPY;  Surgeon: Rogene Houston, MD;  [facility];  Service: Endoscopy;  Laterality: N/A;  830  . COLPOSCOPY    . ORIF ANKLE FRACTURE Right 07/17/2019   Procedure: OPEN REDUCTION INTERNAL FIXATION (ORIF) ANKLE FRACTURE;  Surgeon: Nicholes Stairs, MD;  Location: [facility];  Service: Orthopedics;  Laterality: Right;  90 mins  . TUBAL LIGATION      There were no vitals filed for this visit.  Subjective Assessment - 09/01/19 1415    Subjective  Pt states she stood to make chicken salad over the weekend.    Pertinent History  unremarkable    Limitations  Sitting;Lifting;Standing;Walking;House hold activities    How long can you sit comfortably?  30    How long can you stand comfortably?  10 minutes putting all wt on Lt LE    How long can you walk comfortably?  using crutches NWB has only walked about 50 ft at a time    Patient Stated Goals   drive, walk without crutches or boot, bike    Currently in Pain?  No/denies    Pain Onset  1 to 4 weeks ago                       [facility] Adult PT Treatment/Exercise - 09/01/19 0001      Ambulation/Gait   Ambulation/Gait  Yes    Ambulation/Gait Assistance  6: Modified independent (Device/Increase time)    Ambulation/Gait Assistance Details  one crutch    Ambulation Distance (Feet)  50 Feet      Exercises   Exercises  Ankle      Manual Therapy   Manual Therapy  Edema management;Soft tissue mobilization    Manual therapy comments  done seperate from all other aspects of treatment.     Edema Management  retro massage to decrease swelling       Ankle Exercises: Stretches   Press photographer  --    Slant Board Stretch  3 reps;30 seconds      Ankle Exercises: Standing   Heel Raises  Both;10 reps      Ankle Exercises: Seated   Towel Crunch  1 rep  Heel Raises  --    Toe Raise  --    BAPS  Sitting;Level 3;10 reps    Other Seated Ankle Exercises  --      Ankle Exercises: Supine   Other Supine Ankle Exercises  PROM for DF; contract relax to improve DF      Ankle Exercises: Sidelying   Ankle Inversion Weights (lbs)  --    Ankle Eversion Weights (lbs)  --    Other Sidelying Ankle Exercises  --      Ambulation   Ambulation/Gait Assistance Details  Tactile cues for sequencing;Tactile cues for weight shifting               PT Short Term Goals - 08/28/19 1516      PT SHORT TERM GOAL #1   Title  PT ROM in Rt LE is wnl to allow normal gait    Time  4    Period  Weeks    Status  On-going    Target Date  09/12/19      PT SHORT TERM GOAL #2   Title  Pt to be ambulating without  assistive device and without walking boot    Time  4    Period  Weeks    Status  On-going      PT SHORT TERM GOAL #3   Title  PT pain to be no greater than a 5/10 to allow pt to walk for up to 40 minutes at a time to be able to complete shopping/ housework activity.    Time  4     Period  Weeks    Status  Achieved        PT Long Term Goals - 08/18/19 1631      PT LONG TERM GOAL #1   Title  Pt strength of LE mm to be at least 4+/5 to allow pt to be able to go up and down 2 flights of steps with one hand rail assist without increased pain.    Time  8    Period  Weeks    Status  On-going      PT LONG TERM GOAL #2   Title  PT pain to be no greater than a 2/10 to allow pt to ambulate/ stand for 2 hours at a time to be ready for RTW duties.    Time  8    Period  Weeks    Status  On-going      PT LONG TERM GOAL #3   Title  PT to be able to single leg stance on both LE for at least 45 seconds to allow pt to be confident walking  up/down hills on uneven terrain.    Time  8    Period  Weeks    Status  On-going            Plan - 09/01/19 1539    Clinical Impression Statement  PT able to ambulate step to but not thru with one crutch at this time.  Added slant board stretch as well as standing heel raises to pt program.  Pt able to complete Baps board with full contact of board on floor today without any assist.    Examination-Activity Limitations  Bathing;Bed Mobility;Caring for Others;Carry;Dressing;Lift;Locomotion Level;Sit;Sleep;Squat;Stairs;Stand;Toileting;Transfers    Examination-Participation Restrictions  Church;Cleaning;Community Activity;Driving;Interpersonal Relationship;Laundry;Meal Prep;Shop;Yard Work    Stability/Clinical Decision Making  Stable/Uncomplicated    Rehab Potential  Good    PT Frequency  3x / week  PT Duration  8 weeks    PT Treatment/Interventions  ADLs/Self Care Home Management;Cryotherapy;Electrical Stimulation;DME Instruction;Gait training;Stair training;Functional mobility training;Therapeutic activities;Therapeutic exercise;Balance training;Patient/family education;Manual techniques;Manual lymph drainage;Passive range of motion;Scar mobilization;Dry needling    PT Next Visit Plan  requested that pt brings the mate to her  other tennis shoe, have pt put shoes on and  gradually advance to B  strengthening and balance.  Attempt tandem stance.    PT Home Exercise Plan  ROM, Circles, and isometrics for dorsiflexion/plantarflexion, ABC, towel crunches, sidelying in/eversion, prone DF/PF>       Patient will benefit from skilled therapeutic intervention in order to improve the following deficits and impairments:  Abnormal gait, Decreased activity tolerance, Decreased balance, Decreased endurance, Decreased knowledge of use of DME, Decreased mobility, Decreased range of motion, Decreased strength, Difficulty walking, Increased edema, Increased fascial restricitons, Pain  Visit Diagnosis: Stiffness of right ankle, not elsewhere classified  Muscle weakness (generalized)  Other abnormalities of gait and mobility  Pain in right ankle and joints of right foot     Problem List Patient Active Problem List   Diagnosis Date Noted  . Closed right trimalleolar fracture, initial encounter 07/17/2019    Rayetta Humphrey, PT CLT [phone] 09/01/2019, 3:42 PM  [facility] [address] [place], [place], [address] Phone: [phone]   Fax:  [phone]  Name: Laura [name] MRN: [id number] Date of Birth: [birth year]

## 2019-09-02 ENCOUNTER — Encounter (HOSPITAL_COMMUNITY): Payer: 59 | Admitting: Physical Therapy

## 2019-09-02 ENCOUNTER — Encounter (HOSPITAL_COMMUNITY): Payer: 59

## 2019-09-04 ENCOUNTER — Other Ambulatory Visit: Payer: Self-pay

## 2019-09-04 ENCOUNTER — Ambulatory Visit (HOSPITAL_COMMUNITY): Payer: 59 | Attending: Orthopedic Surgery | Admitting: Physical Therapy

## 2019-09-04 ENCOUNTER — Encounter (HOSPITAL_COMMUNITY): Payer: Self-pay | Admitting: Physical Therapy

## 2019-09-04 DIAGNOSIS — M25571 Pain in right ankle and joints of right foot: Secondary | ICD-10-CM | POA: Insufficient documentation

## 2019-09-04 DIAGNOSIS — R2689 Other abnormalities of gait and mobility: Secondary | ICD-10-CM | POA: Diagnosis not present

## 2019-09-04 DIAGNOSIS — M6281 Muscle weakness (generalized): Secondary | ICD-10-CM | POA: Insufficient documentation

## 2019-09-04 DIAGNOSIS — M25671 Stiffness of right ankle, not elsewhere classified: Secondary | ICD-10-CM | POA: Diagnosis not present

## 2019-09-04 NOTE — Therapy (Signed)
[facility] [address] [place], [place], [address] Phone: [phone]   Fax:  [phone]  Physical Therapy Treatment  Patient Details  Name: Laura [name] MRN: [id number] Date of Birth: Apr 22, 1966 Referring Provider (PT): Victorino December   Encounter Date: 09/04/2019   Progress Note Reporting Period 08/15/2019  to 09/04/2019  See note below for Objective Data and Assessment of Progress/Goals.      PT End of Session - 09/04/19 1228    Visit Number  9   Reassess done visit 9   Number of Visits  24    Date for PT Re-Evaluation  10/14/19    Authorization Type  UMR    PT Start Time  1130    PT Stop Time  1230    PT Time Calculation (min)  60 min    Equipment Utilized During Treatment  Gait belt    Activity Tolerance  Patient limited by pain    Behavior During Therapy  WFL for tasks assessed/performed       Past Medical History:  Diagnosis Date  . Ankle fracture    Right  . ASCUS favor benign 2004   colposcopy negative  . Vertigo    on occasion     Past Surgical History:  Procedure Laterality Date  . ABDOMINAL HYSTERECTOMY  01/2010   Leiomyomata  . CESAREAN SECTION  1991   x1  . COLONOSCOPY N/A 09/03/2014   Procedure: COLONOSCOPY;  Surgeon: Rogene Houston, MD;  [facility];  Service: Endoscopy;  Laterality: N/A;  830  . COLPOSCOPY    . ORIF ANKLE FRACTURE Right 07/17/2019   Procedure: OPEN REDUCTION INTERNAL FIXATION (ORIF) ANKLE FRACTURE;  Surgeon: Nicholes Stairs, MD;  Location: [facility];  Service: Orthopedics;  Laterality: Right;  90 mins  . TUBAL LIGATION      There were no vitals filed for this visit.  Subjective Assessment - 09/04/19 1134    Subjective  PT concerned that she is still swelling.    Pertinent History  unremarkable    Limitations  Sitting;Lifting;Standing;Walking;House hold activities    How long can you sit comfortably?  30    How long can you stand comfortably?  10 minutes  putting all wt on Lt LE    How long can you walk comfortably?  using crutches NWB has only walked about 50 ft at a time    Patient Stated Goals  drive, walk without crutches or boot, bike    Currently in Pain?  No/denies   not pain just pressure from swelling.   Pain Onset  1 to 4 weeks ago         [facility] PT Assessment - 09/04/19 0001      Assessment   Medical Diagnosis  Rt ankle fx with ORIF     Referring Provider (PT)  Victorino December    Onset Date/Surgical Date  07/17/19    Next MD Visit  Mordecai Rasmussen    Prior Therapy  acute      Precautions   Precautions  Fall      Restrictions   Weight Bearing Restrictions  Yes    RLE Weight Bearing  Partial weight bearing      Home Environment   Living Environment  Private residence    Home Access  Stairs to enter      Prior Function   Level of Independence  Independent    Vocation  Full time employment    Vocation Requirements  sedentary       Cognition   Overall Cognitive Status  Within Functional Limits for tasks assessed      Observation/Other Assessments   Focus on Therapeutic Outcomes (FOTO)   14; 86 % affected       Observation/Other Assessments-Edema    Edema  Figure 8      Figure 8 Edema   Figure 8 - Right   50.5   was 52    Figure 8 - Left   46      AROM   Right Ankle Dorsiflexion  -9   was -40   Right Ankle Plantar Flexion  60   was 50   Right Ankle Inversion  20   was 5   Right Ankle Eversion  15   was 3   Left Ankle Dorsiflexion  15    Left Ankle Plantar Flexion  60    Left Ankle Inversion  30    Left Ankle Eversion  25      Strength   Right Ankle Dorsiflexion  3+/5   was 2/5   Right Ankle Plantar Flexion  4-/5    Right Ankle Inversion  3+/5   was 2   Right Ankle Eversion  3-/5   2/5   Left Ankle Dorsiflexion  5/5    Left Ankle Plantar Flexion  5/5    Left Ankle Inversion  5/5    Left Ankle Eversion  5/5      Ambulation/Gait   Assistive device  Crutches   Walking boot   Gait Pattern   Step-through pattern    Gait Comments  --                   [facility] Adult PT Treatment/Exercise - 09/04/19 0001      Ambulation/Gait   Ambulation/Gait  Yes    Ambulation/Gait Assistance  6: Modified independent (Device/Increase time)    Ambulation/Gait Assistance Details  one crutch    Ambulation Distance (Feet)  50 Feet      Exercises   Exercises  Ankle      Manual Therapy   Manual Therapy  Edema management;Soft tissue mobilization    Manual therapy comments  done seperate from all other aspects of treatment.     Edema Management  retro massage to decrease swelling       Ankle Exercises: Stretches   Slant Board Stretch  1 rep;60 seconds      Ankle Exercises: Standing   Rocker Board  1 minute    Rocker Board Limitations  Dorsi/plantarflexion    Heel Raises  Both;15 reps    Other Standing Ankle Exercises  wt shift both forward and back as well as Rt and lt    shoes on no boot      Ankle Exercises: Seated   Towel Crunch  --    BAPS  Sitting;Level 3;10 reps      Ankle Exercises: Supine   Other Supine Ankle Exercises  PROM for DF; contract relax to improve DF      Ambulation   Ambulation/Gait Assistance Details  Tactile cues for sequencing;Tactile cues for weight shifting               PT Short Term Goals - 08/28/19 1516      PT SHORT TERM GOAL #1   Title  PT ROM in Rt LE is wnl to allow normal gait    Time  4    Period  Weeks  Status  On-going    Target Date  09/12/19      PT SHORT TERM GOAL #2   Title  Pt to be ambulating without  assistive device and without walking boot    Time  4    Period  Weeks    Status  On-going      PT SHORT TERM GOAL #3   Title  PT pain to be no greater than a 5/10 to allow pt to walk for up to 40 minutes at a time to be able to complete shopping/ housework activity.    Time  4    Period  Weeks    Status  Achieved        PT Long Term Goals - 08/18/19 1631      PT LONG TERM GOAL #1   Title  Pt strength of  LE mm to be at least 4+/5 to allow pt to be able to go up and down 2 flights of steps with one hand rail assist without increased pain.    Time  8    Period  Weeks    Status  On-going      PT LONG TERM GOAL #2   Title  PT pain to be no greater than a 2/10 to allow pt to ambulate/ stand for 2 hours at a time to be ready for RTW duties.    Time  8    Period  Weeks    Status  On-going      PT LONG TERM GOAL #3   Title  PT to be able to single leg stance on both LE for at least 45 seconds to allow pt to be confident walking  up/down hills on uneven terrain.    Time  8    Period  Weeks    Status  On-going            Plan - 09/04/19 1229    Clinical Impression Statement  Pt brought mate to shoe.  Donned shoe and completed standing exercises without boot.  Pt reassessed this session with good gains in all aspects.  Pt needs to concentrate on increasing DF.  Therapist noted stitch on medial aspect of incision which was taken out without incident.    Examination-Activity Limitations  Bathing;Bed Mobility;Caring for Others;Carry;Dressing;Lift;Locomotion Level;Sit;Sleep;Squat;Stairs;Stand;Toileting;Transfers    Examination-Participation Restrictions  Church;Cleaning;Community Activity;Driving;Interpersonal Relationship;Laundry;Meal Prep;Shop;Yard Work    Stability/Clinical Decision Making  Stable/Uncomplicated    Rehab Potential  Good    PT Frequency  3x / week    PT Duration  8 weeks    PT Treatment/Interventions  ADLs/Self Care Home Management;Cryotherapy;Electrical Stimulation;DME Instruction;Gait training;Stair training;Functional mobility training;Therapeutic activities;Therapeutic exercise;Balance training;Patient/family education;Manual techniques;Manual lymph drainage;Passive range of motion;Scar mobilization;Dry needling    PT Next Visit Plan  Attempt tandem stance. Manual to increase DF    PT Home Exercise Plan  ROM, Circles, and isometrics for dorsiflexion/plantarflexion, ABC, towel  crunches, sidelying in/eversion, prone DF/PF>       Patient will benefit from skilled therapeutic intervention in order to improve the following deficits and impairments:  Abnormal gait, Decreased activity tolerance, Decreased balance, Decreased endurance, Decreased knowledge of use of DME, Decreased mobility, Decreased range of motion, Decreased strength, Difficulty walking, Increased edema, Increased fascial restricitons, Pain  Visit Diagnosis: Stiffness of right ankle, not elsewhere classified  Muscle weakness (generalized)  Other abnormalities of gait and mobility     Problem List Patient Active Problem List   Diagnosis Date Noted  . Closed right trimalleolar  fracture, initial encounter 07/17/2019   Rayetta Humphrey, PT CLT [phone] 09/04/2019, 12:33 PM  [facility] [address] [place], [place], [address] Phone: [phone]   Fax:  [phone]  Name: Laura [name] MRN: [id number] Date of Birth: [birth year]

## 2019-09-05 ENCOUNTER — Ambulatory Visit (HOSPITAL_COMMUNITY): Payer: 59 | Admitting: Physical Therapy

## 2019-09-05 ENCOUNTER — Encounter (HOSPITAL_COMMUNITY): Payer: Self-pay | Admitting: Physical Therapy

## 2019-09-05 DIAGNOSIS — M25571 Pain in right ankle and joints of right foot: Secondary | ICD-10-CM | POA: Diagnosis not present

## 2019-09-05 DIAGNOSIS — M6281 Muscle weakness (generalized): Secondary | ICD-10-CM | POA: Diagnosis not present

## 2019-09-05 DIAGNOSIS — M25671 Stiffness of right ankle, not elsewhere classified: Secondary | ICD-10-CM | POA: Diagnosis not present

## 2019-09-05 DIAGNOSIS — R2689 Other abnormalities of gait and mobility: Secondary | ICD-10-CM | POA: Diagnosis not present

## 2019-09-05 NOTE — Therapy (Signed)
[facility] [address] [place], [place], [address] Phone: [phone]   Fax:  [phone]  Physical Therapy Treatment  Patient Details  Name: Laura [name] MRN: [id number] Date of Birth: 12/01/1966 Referring Provider (PT): Victorino December   Encounter Date: 09/05/2019  PT End of Session - 09/05/19 1217    Visit Number  10   Reassess done visit 9   Number of Visits  24    Date for PT Re-Evaluation  10/14/19    Authorization Type  UMR    PT Start Time  1135    PT Stop Time  1215    PT Time Calculation (min)  40 min    Activity Tolerance  Patient limited by pain    Behavior During Therapy  [facility] for tasks assessed/performed       Past Medical History:  Diagnosis Date  . Ankle fracture    Right  . ASCUS favor benign 2004   colposcopy negative  . Vertigo    on occasion     Past Surgical History:  Procedure Laterality Date  . ABDOMINAL HYSTERECTOMY  01/2010   Leiomyomata  . CESAREAN SECTION  1991   x1  . COLONOSCOPY N/A 09/03/2014   Procedure: COLONOSCOPY;  Surgeon: Rogene Houston, MD;  [facility];  Service: Endoscopy;  Laterality: N/A;  830  . COLPOSCOPY    . ORIF ANKLE FRACTURE Right 07/17/2019   Procedure: OPEN REDUCTION INTERNAL FIXATION (ORIF) ANKLE FRACTURE;  Surgeon: Nicholes Stairs, MD;  Location: [facility];  Service: Orthopedics;  Laterality: Right;  90 mins  . TUBAL LIGATION      There were no vitals filed for this visit.  Subjective Assessment - 09/05/19 1142    Subjective  Patient reports continued stiffness/ tightness in RT ankle. Patient says she has been doing HEP without issue.    Pertinent History  unremarkable    Limitations  Sitting;Lifting;Standing;Walking;House hold activities    How long can you sit comfortably?  30    How long can you stand comfortably?  10 minutes putting all wt on Lt LE    How long can you walk comfortably?  using crutches NWB has only walked about 50 ft at a time     Patient Stated Goals  drive, walk without crutches or boot, bike    Currently in Pain?  No/denies    Pain Score  0-No pain    Pain Location  Ankle    Pain Orientation  Right    Pain Onset  1 to 4 weeks ago                       [facility] Adult PT Treatment/Exercise - 09/05/19 0001      Exercises   Exercises  Ankle      Ankle Exercises: Stretches   Slant Board Stretch  1 rep;60 seconds    Other Stretch  Knee drivers; 3 sets 30 seconds      Ankle Exercises: Standing   Rocker Board  1 minute    Rocker Board Limitations  Dorsi/plantarflexion    Heel Raises  Both;15 reps    Other Standing Ankle Exercises  wt shift both forward and back as well as Rt and lt    shoes on no boot      Ankle Exercises: Seated   BAPS  Sitting;Level 4;10 reps          Balance Exercises - 09/05/19 1204  Balance Exercises: Standing   Tandem Stance  Eyes open;Intermittent upper extremity support;5 reps   With head turns         PT Short Term Goals - 08/28/19 1516      PT SHORT TERM GOAL #1   Title  PT ROM in Rt LE is wnl to allow normal gait    Time  4    Period  Weeks    Status  On-going    Target Date  09/12/19      PT SHORT TERM GOAL #2   Title  Pt to be ambulating without  assistive device and without walking boot    Time  4    Period  Weeks    Status  On-going      PT SHORT TERM GOAL #3   Title  PT pain to be no greater than a 5/10 to allow pt to walk for up to 40 minutes at a time to be able to complete shopping/ housework activity.    Time  4    Period  Weeks    Status  Achieved        PT Long Term Goals - 08/18/19 1631      PT LONG TERM GOAL #1   Title  Pt strength of LE mm to be at least 4+/5 to allow pt to be able to go up and down 2 flights of steps with one hand rail assist without increased pain.    Time  8    Period  Weeks    Status  On-going      PT LONG TERM GOAL #2   Title  PT pain to be no greater than a 2/10 to allow pt to ambulate/  stand for 2 hours at a time to be ready for RTW duties.    Time  8    Period  Weeks    Status  On-going      PT LONG TERM GOAL #3   Title  PT to be able to single leg stance on both LE for at least 45 seconds to allow pt to be confident walking  up/down hills on uneven terrain.    Time  8    Period  Weeks    Status  On-going            Plan - 09/05/19 1218    Clinical Impression Statement  Session focused on improving RT ankle mobility and tolerance to weight beraing activity. Patient continues to demonstrate significant RT ankle ROM restricitons as well as increased pain with weightbearing which continues to negatively impact functional mobility. Will continue to focus on progressing ankle ROM and weight bearing tolerance to reduce pain.    Examination-Activity Limitations  Bathing;Bed Mobility;Caring for Others;Carry;Dressing;Lift;Locomotion Level;Sit;Sleep;Squat;Stairs;Stand;Toileting;Transfers    Examination-Participation Restrictions  Church;Cleaning;Community Activity;Driving;Interpersonal Relationship;Laundry;Meal Prep;Shop;Yard Work    Stability/Clinical Decision Making  Stable/Uncomplicated    Rehab Potential  Good    PT Frequency  3x / week    PT Duration  8 weeks    PT Treatment/Interventions  ADLs/Self Care Home Management;Cryotherapy;Electrical Stimulation;DME Instruction;Gait training;Stair training;Functional mobility training;Therapeutic activities;Therapeutic exercise;Balance training;Patient/family education;Manual techniques;Manual lymph drainage;Passive range of motion;Scar mobilization;Dry needling    PT Next Visit Plan  Add plantar fascia stretch    PT Home Exercise Plan  ROM, Circles, and isometrics for dorsiflexion/plantarflexion, ABC, towel crunches, sidelying in/eversion, prone DF/PF>       Patient will benefit from skilled therapeutic intervention in order to improve the following deficits and impairments:  Abnormal gait, Decreased activity tolerance,  Decreased balance, Decreased endurance, Decreased knowledge of use of DME, Decreased mobility, Decreased range of motion, Decreased strength, Difficulty walking, Increased edema, Increased fascial restricitons, Pain  Visit Diagnosis: Stiffness of right ankle, not elsewhere classified  Muscle weakness (generalized)  Other abnormalities of gait and mobility     Problem List Patient Active Problem List   Diagnosis Date Noted  . Closed right trimalleolar fracture, initial encounter 07/17/2019   Rayetta Humphrey, PT CLT [facility] [address] [place], [place], [address] Phone: [phone]   Fax:  [phone]  Name: Nyesha Dott MRN: [id number] Date of Birth: [birth year]

## 2019-09-08 ENCOUNTER — Ambulatory Visit (HOSPITAL_COMMUNITY): Payer: 59 | Admitting: Physical Therapy

## 2019-09-08 ENCOUNTER — Other Ambulatory Visit: Payer: Self-pay

## 2019-09-08 ENCOUNTER — Encounter (HOSPITAL_COMMUNITY): Payer: Self-pay | Admitting: Physical Therapy

## 2019-09-08 DIAGNOSIS — R2689 Other abnormalities of gait and mobility: Secondary | ICD-10-CM | POA: Diagnosis not present

## 2019-09-08 DIAGNOSIS — M6281 Muscle weakness (generalized): Secondary | ICD-10-CM | POA: Diagnosis not present

## 2019-09-08 DIAGNOSIS — M25671 Stiffness of right ankle, not elsewhere classified: Secondary | ICD-10-CM

## 2019-09-08 DIAGNOSIS — M25571 Pain in right ankle and joints of right foot: Secondary | ICD-10-CM | POA: Diagnosis not present

## 2019-09-08 NOTE — Therapy (Addendum)
[facility] [place], [place], [address] Phone: [phone]   Fax:  [phone]  Physical Therapy Treatment  Patient Details  Name: Laura [name] MRN: [id number] Date of Birth: July 21, 1966 Referring Provider (PT): Victorino December   Encounter Date: 09/08/2019  PT End of Session - 09/08/19 1219    Visit Number  91   Reassess done visit 9   Number of Visits  24    Date for PT Re-Evaluation  10/14/19    Authorization Type  UMR    PT Start Time  1135    PT Stop Time  1215    PT Time Calculation (min)  40 min    Activity Tolerance  Patient limited by pain    Behavior During Therapy  [facility] for tasks assessed/performed       Past Medical History:  Diagnosis Date  . Ankle fracture    Right  . ASCUS favor benign 2004   colposcopy negative  . Vertigo    on occasion     Past Surgical History:  Procedure Laterality Date  . ABDOMINAL HYSTERECTOMY  01/2010   Leiomyomata  . CESAREAN SECTION  1991   x1  . COLONOSCOPY N/A 09/03/2014   Procedure: COLONOSCOPY;  Surgeon: Rogene Houston, MD;  [facility];  Service: Endoscopy;  Laterality: N/A;  830  . COLPOSCOPY    . ORIF ANKLE FRACTURE Right 07/17/2019   Procedure: OPEN REDUCTION INTERNAL FIXATION (ORIF) ANKLE FRACTURE;  Surgeon: Nicholes Stairs, MD;  Location: [facility];  Service: Orthopedics;  Laterality: Right;  90 mins  . TUBAL LIGATION      There were no vitals filed for this visit.  Subjective Assessment - 09/08/19 1143    Subjective  Pt is sleeping in her compression garment, she is able to shower by herself now.    Pertinent History  unremarkable    Limitations  Sitting;Lifting;Standing;Walking;House hold activities    How long can you sit comfortably?  30    How long can you stand comfortably?  10 minutes putting all wt on Lt LE    How long can you walk comfortably?  using crutches NWB has only walked about 50 ft at a time    Patient Stated Goals   drive, walk without crutches or boot, bike    Currently in Pain?  Yes    Pain Score  5     Pain Location  Ankle    Pain Orientation  Right    Pain Descriptors / Indicators  Aching;Tightness    Pain Type  Acute pain    Pain Onset  1 to 4 weeks ago    Aggravating Factors   exercises    Pain Relieving Factors  ice                       [facility] Adult PT Treatment/Exercise - 09/08/19 0001      Exercises   Exercises  Ankle      Manual Therapy   Manual Therapy  Edema management;Manual Lymphatic Drainage (MLD)    Manual therapy comments  done seperate from all other aspects of treatment.     Edema Management  retro massage to decrease swelling , PROM, AAROM      Ankle Exercises: Stretches   Plantar Fascia Stretch  4 reps;10 seconds    Slant Board Stretch  1 rep;60 seconds    Other Stretch  Knee drivers; 3 sets 30 seconds  Ankle Exercises: Standing   Rocker Board  --    Rocker Board Limitations  --    Heel Raises  Both;15 reps    Other Standing Ankle Exercises  wt shift both forward and back as well as Rt and lt    shoes on no boot      Ankle Exercises: Seated   BAPS  --               PT Short Term Goals - 09/08/19 1222      PT SHORT TERM GOAL #1   Title  PT ROM in Rt LE is wnl to allow normal gait    Time  4    Period  Weeks    Status  Partially Met   all but DF   Target Date  09/12/19      PT SHORT TERM GOAL #2   Title  Pt to be ambulating without  assistive device and without walking boot    Time  4    Period  Weeks    Status  On-going      PT SHORT TERM GOAL #3   Title  PT pain to be no greater than a 5/10 to allow pt to walk for up to 40 minutes at a time to be able to complete shopping/ housework activity.    Time  4    Period  Weeks    Status  Achieved        PT Long Term Goals - 09/08/19 1223      PT LONG TERM GOAL #1   Title  Pt strength of LE mm to be at least 4+/5 to allow pt to be able to go up and down 2 flights of steps  with one hand rail assist without increased pain.    Time  8    Period  Weeks    Status  On-going      PT LONG TERM GOAL #2   Title  PT pain to be no greater than a 2/10 to allow pt to ambulate/ stand for 2 hours at a time to be ready for RTW duties.    Time  8    Period  Weeks    Status  On-going      PT LONG TERM GOAL #3   Title  PT to be able to single leg stance on both LE for at least 45 seconds to allow pt to be confident walking  up/down hills on uneven terrain.    Time  8    Period  Weeks    Status  On-going            Plan - 09/08/19 1220    Clinical Impression Statement  Continued to work on increasing dorsiflexion.  PT is able to obtain neutral with PROM at approximately 5 degrees.  Plantar flexion stretch added with minimal difficulty; Pt needs verbal and occasional tactile cuing to remain in good form with stretches.    Examination-Activity Limitations  Bathing;Bed Mobility;Caring for Others;Carry;Dressing;Lift;Locomotion Level;Sit;Sleep;Squat;Stairs;Stand;Toileting;Transfers    Examination-Participation Restrictions  Church;Cleaning;Community Activity;Driving;Interpersonal Relationship;Laundry;Meal Prep;Shop;Yard Work    Stability/Clinical Decision Making  Stable/Uncomplicated    Rehab Potential  Good    PT Frequency  3x / week    PT Duration  8 weeks    PT Treatment/Interventions        Plan  ADLs/Self Care Home Management;Cryotherapy;Electrical Stimulation;DME Instruction;Gait training;Stair training;Functional mobility training;Therapeutic activities;Therapeutic exercise;Balance training;Patient/family education;Manual techniques;Manual lymph drainage;Passive range of motion;Scar mobilization;Dry needling  Begin functional squats, measure for ROM possible Tband    PT Home Exercise Plan  ROM, Circles, and isometrics for dorsiflexion/plantarflexion, ABC, towel crunches, sidelying in/eversion, prone DF/PF>       Patient will benefit from skilled  therapeutic intervention in order to improve the following deficits and impairments:  Abnormal gait, Decreased activity tolerance, Decreased balance, Decreased endurance, Decreased knowledge of use of DME, Decreased mobility, Decreased range of motion, Decreased strength, Difficulty walking, Increased edema, Increased fascial restricitons, Pain  Visit Diagnosis: Stiffness of right ankle, not elsewhere classified  Muscle weakness (generalized)  Other abnormalities of gait and mobility  Pain in right ankle and joints of right foot     Problem List Patient Active Problem List   Diagnosis Date Noted  . Closed right trimalleolar fracture, initial encounter 07/17/2019  Rayetta Humphrey, PT CLT [phone] 09/08/2019, 12:23 PM  [facility] [place], [place], [address] Phone: [phone]   Fax:  [phone]  Name: Laura [name] MRN: [id number] Date of Birth: [birth year]

## 2019-09-09 ENCOUNTER — Encounter (HOSPITAL_COMMUNITY): Payer: 59 | Admitting: Physical Therapy

## 2019-09-09 ENCOUNTER — Encounter (HOSPITAL_COMMUNITY): Payer: 59

## 2019-09-10 ENCOUNTER — Encounter (HOSPITAL_COMMUNITY): Payer: 59

## 2019-09-10 ENCOUNTER — Other Ambulatory Visit: Payer: Self-pay

## 2019-09-10 ENCOUNTER — Encounter (HOSPITAL_COMMUNITY): Payer: Self-pay | Admitting: Physical Therapy

## 2019-09-10 ENCOUNTER — Ambulatory Visit (HOSPITAL_COMMUNITY): Payer: 59 | Admitting: Physical Therapy

## 2019-09-10 DIAGNOSIS — M25571 Pain in right ankle and joints of right foot: Secondary | ICD-10-CM | POA: Diagnosis not present

## 2019-09-10 DIAGNOSIS — M25671 Stiffness of right ankle, not elsewhere classified: Secondary | ICD-10-CM | POA: Diagnosis not present

## 2019-09-10 DIAGNOSIS — R2689 Other abnormalities of gait and mobility: Secondary | ICD-10-CM | POA: Diagnosis not present

## 2019-09-10 DIAGNOSIS — M6281 Muscle weakness (generalized): Secondary | ICD-10-CM | POA: Diagnosis not present

## 2019-09-10 NOTE — Therapy (Signed)
[place], [place], [address] Phone: [phone]   Fax:  [phone]  Physical Therapy Treatment  Patient Details  Name: Laura [name] MRN: [id number] Date of Birth: 10-05-1966 Referring Provider (PT): Victorino December   Encounter Date: 09/10/2019  PT End of Session - 09/10/19 1627    Visit Number  24   Reassess done visit 9   Number of Visits  24    Date for PT Re-Evaluation  10/14/19    Authorization Type  UMR    PT Start Time  1535    PT Stop Time  1627    PT Time Calculation (min)  52 min    Activity Tolerance  Patient limited by pain    Behavior During Therapy  [facility] for tasks assessed/performed       Past Medical History:  Diagnosis Date  . Ankle fracture    Right  . ASCUS favor benign 2004   colposcopy negative  . Vertigo    on occasion     Past Surgical History:  Procedure Laterality Date  . ABDOMINAL HYSTERECTOMY  01/2010   Leiomyomata  . CESAREAN SECTION  1991   x1  . COLONOSCOPY N/A 09/03/2014   Procedure: COLONOSCOPY;  Surgeon: Rogene Houston, MD;  [facility];  Service: Endoscopy;  Laterality: N/A;  830  . COLPOSCOPY    . ORIF ANKLE FRACTURE Right 07/17/2019   Procedure: OPEN REDUCTION INTERNAL FIXATION (ORIF) ANKLE FRACTURE;  Surgeon: Nicholes Stairs, MD;  Location: [place];  Service: Orthopedics;  Laterality: Right;  90 mins  . TUBAL LIGATION      There were no vitals filed for this visit.  Subjective Assessment - 09/10/19 1534    Subjective  Pt states that her swelling is down today.    Pertinent History  unremarkable    Limitations  Sitting;Lifting;Standing;Walking;House hold activities    How long can you sit comfortably?  30    How long can you stand comfortably?  10 minutes putting all wt on Lt LE    How long can you walk comfortably?  using crutches NWB has only walked about 50 ft at a time    Patient Stated Goals  drive, walk without crutches or boot,  bike    Currently in Pain?  Yes    Pain Score  5     Pain Location  Ankle    Pain Orientation  Right    Pain Descriptors / Indicators  Aching;Tightness    Pain Type  Acute pain    Pain Onset  1 to 4 weeks ago         [facility] PT Assessment - 09/10/19 0001      AROM   Right Ankle Dorsiflexion  -9    Right Ankle Plantar Flexion  60                   [facility] Adult PT Treatment/Exercise - 09/10/19 0001      Exercises   Exercises  Ankle      Manual Therapy   Manual Therapy  --    Manual therapy comments  --    Edema Management  --      Ankle Exercises: Stretches   Plantar Fascia Stretch  4 reps;30 seconds    Slant Board Stretch  1 rep;60 seconds    Other Stretch  Knee drivers; 3 sets 30 seconds      Ankle Exercises: Standing  BAPS  Level 2;Standing;5 reps    Heel Raises  Both;15 reps    Other Standing Ankle Exercises  wt shift both forward and back as well as Rt and lt    shoes on no boot    Other Standing Ankle Exercises  squat x 10       Ankle Exercises: Supine   T-Band  ALL x 10    Other Supine Ankle Exercises  PROM for DF; contract relax to improve DF             PT Education - 09/10/19 1630    Education Details  t-band exercises and squats       PT Short Term Goals - 09/08/19 1222      PT SHORT TERM GOAL #1   Title  PT ROM in Rt LE is wnl to allow normal gait    Time  4    Period  Weeks    Status  Partially Met   all but DF   Target Date  09/12/19      PT SHORT TERM GOAL #2   Title  Pt to be ambulating without  assistive device and without walking boot    Time  4    Period  Weeks    Status  On-going      PT SHORT TERM GOAL #3   Title  PT pain to be no greater than a 5/10 to allow pt to walk for up to 40 minutes at a time to be able to complete shopping/ housework activity.    Time  4    Period  Weeks    Status  Achieved        PT Long Term Goals - 09/08/19 1223      PT LONG TERM GOAL #1   Title  Pt strength of LE mm to be  at least 4+/5 to allow pt to be able to go up and down 2 flights of steps with one hand rail assist without increased pain.    Time  8    Period  Weeks    Status  On-going      PT LONG TERM GOAL #2   Title  PT pain to be no greater than a 2/10 to allow pt to ambulate/ stand for 2 hours at a time to be ready for RTW duties.    Time  8    Period  Weeks    Status  On-going      PT LONG TERM GOAL #3   Title  PT to be able to single leg stance on both LE for at least 45 seconds to allow pt to be confident walking  up/down hills on uneven terrain.    Time  8    Period  Weeks    Status  On-going            Plan - 09/10/19 1628    Clinical Impression Statement  Began Baps while standing, added tband exercises. Pt able to stand with improved wt on Rt LE during wt shifting activities.  Active and Passive Dorsiflexion remains pt greatest limitation.    Examination-Activity Limitations  Bathing;Bed Mobility;Caring for Others;Carry;Dressing;Lift;Locomotion Level;Sit;Sleep;Squat;Stairs;Stand;Toileting;Transfers    Examination-Participation Restrictions  Church;Cleaning;Community Activity;Driving;Interpersonal Relationship;Laundry;Meal Prep;Shop;Yard Work    Stability/Clinical Decision Making  Stable/Uncomplicated    Rehab Potential  Good    PT Frequency  3x / week    PT Duration  8 weeks    PT Treatment/Interventions  ADLs/Self Care Home Management;Cryotherapy;Dealer  Stimulation;DME Instruction;Gait training;Stair training;Functional mobility training;Therapeutic activities;Therapeutic exercise;Balance training;Patient/family education;Manual techniques;Manual lymph drainage;Passive range of motion;Scar mobilization;Dry needling    PT Next Visit Plan  Continue with aggresive PROM for dorsiflexion.    PT Home Exercise Plan  ROM, Circles, and isometrics for dorsiflexion/plantarflexion, ABC, towel crunches, sidelying in/eversion, prone DF/PF>       Patient will benefit from skilled  therapeutic intervention in order to improve the following deficits and impairments:  Abnormal gait, Decreased activity tolerance, Decreased balance, Decreased endurance, Decreased knowledge of use of DME, Decreased mobility, Decreased range of motion, Decreased strength, Difficulty walking, Increased edema, Increased fascial restricitons, Pain  Visit Diagnosis: Stiffness of right ankle, not elsewhere classified  Muscle weakness (generalized)  Other abnormalities of gait and mobility  Pain in right ankle and joints of right foot     Problem List Patient Active Problem List   Diagnosis Date Noted  . Closed right trimalleolar fracture, initial encounter 07/17/2019   Rayetta Humphrey, PT CLT [phone] 09/10/2019, 4:31 PM  [place] [address] [place], [place], [address] Phone: [phone]   Fax:  [phone]  Name: Laura [name] MRN: [id number] Date of Birth: 1966/08/27

## 2019-09-10 NOTE — Patient Instructions (Addendum)
Functional Quadriceps: Chair Squat    Keeping feet flat on floor, shoulder width apart, squat as low as is comfortable. Use support as necessary. Repeat __10__ times per set. Do _1___ sets per session. Do _2___ sessions per day.  http://orth.exer.us/736   Copyright  VHI. All rights reserved.  Dorsiflexion: Resisted    Facing anchor, tubing around left foot, pull toward face.  Repeat _10___ times per set. Do _1___ sets per session. Do _2___ sessions per day.  http://orth.exer.us/8   Copyright  VHI. All rights reserved.  Eversion: Resisted    With right foot in tubing loop, hold tubing around other foot to resist and turn foot out. Repeat _10___ times per set. Do __1__ sets per session. Do __2__ sessions per day.  http://orth.exer.us/14   Copyright  VHI. All rights reserved.  Inversion: Resisted    Cross legs with right leg underneath, foot in tubing loop. Hold tubing around other foot to resist and turn foot in. Repeat __10__ times per set. Do __1__ sets per session. Do _2___ sessions per day.  http://orth.exer.us/12   Copyright  VHI. All rights reserved.  Plantar Flexion: Resisted    Anchor behind, tubing around left foot, press down.2 Repeat __10__ times per set. Do _1___ sets per session. Do ____ sessions per day.  http://orth.exer.us/10   Copyright  VHI. All rights reserved.

## 2019-09-11 ENCOUNTER — Ambulatory Visit (HOSPITAL_COMMUNITY): Payer: 59

## 2019-09-11 ENCOUNTER — Encounter (HOSPITAL_COMMUNITY): Payer: Self-pay

## 2019-09-11 DIAGNOSIS — M25571 Pain in right ankle and joints of right foot: Secondary | ICD-10-CM

## 2019-09-11 DIAGNOSIS — M25671 Stiffness of right ankle, not elsewhere classified: Secondary | ICD-10-CM | POA: Diagnosis not present

## 2019-09-11 DIAGNOSIS — R2689 Other abnormalities of gait and mobility: Secondary | ICD-10-CM

## 2019-09-11 DIAGNOSIS — M6281 Muscle weakness (generalized): Secondary | ICD-10-CM

## 2019-09-11 NOTE — Therapy (Signed)
[place], [place], [address] Phone: [phone]   Fax:  [phone]  Physical Therapy Treatment  Patient Details  Name: Laura [name] MRN: [id number] Date of Birth: 25-Dec-1965 Referring Provider (PT): Victorino December   Encounter Date: 09/11/2019  PT End of Session - 09/11/19 0920    Visit Number  5   Reassess complete visit #9   Number of Visits  24    Date for PT Re-Evaluation  10/14/19    Authorization Type  UMR    PT Start Time  0918    PT Stop Time  1002    PT Time Calculation (min)  44 min    Activity Tolerance  Patient tolerated treatment well;No increased pain;Patient limited by pain   c/o stiffness through session   Behavior During Therapy  [facility] for tasks assessed/performed       Past Medical History:  Diagnosis Date  . Ankle fracture    Right  . ASCUS favor benign 2004   colposcopy negative  . Vertigo    on occasion     Past Surgical History:  Procedure Laterality Date  . ABDOMINAL HYSTERECTOMY  01/2010   Leiomyomata  . CESAREAN SECTION  1991   x1  . COLONOSCOPY N/A 09/03/2014   Procedure: COLONOSCOPY;  Surgeon: Rogene Houston, MD;  [facility];  Service: Endoscopy;  Laterality: N/A;  830  . COLPOSCOPY    . ORIF ANKLE FRACTURE Right 07/17/2019   Procedure: OPEN REDUCTION INTERNAL FIXATION (ORIF) ANKLE FRACTURE;  Surgeon: Nicholes Stairs, MD;  Location: [place];  Service: Orthopedics;  Laterality: Right;  90 mins  . TUBAL LIGATION      There were no vitals filed for this visit.  Subjective Assessment - 09/11/19 0919    Subjective  Pt stated ankle is feeling good, reports swelling is down this morning.  Wearing compression garment today.  Brought tennis shoes to wear today.    Currently in Pain?  No/denies         [facility] PT Assessment - 09/11/19 0001      Assessment   Medical Diagnosis  Rt ankle fx with ORIF     Referring Provider (PT)  Victorino December    Onset  Date/Surgical Date  07/17/19    Next MD Visit  09/30/19      Precautions   Precautions  Fall      Restrictions   Weight Bearing Restrictions  Yes    RLE Weight Bearing  Partial weight bearing      AROM   Right Ankle Dorsiflexion  -9                   [facility] Adult PT Treatment/Exercise - 09/11/19 0001      Exercises   Exercises  Ankle      Manual Therapy   Manual Therapy  Soft tissue mobilization;Passive ROM    Manual therapy comments  done seperate from all other aspects of treatment.     Soft tissue mobilization  Prone gastroc/soleus complex    Passive ROM  prone DF PROM      Ankle Exercises: Stretches   Slant Board Stretch  3 reps;30 seconds    Other Stretch  Knee drivers; 3 sets 30 seconds      Ankle Exercises: Standing   BAPS  Level 2;Standing;5 reps    Rocker Board  1 minute    Rocker Board Limitations  Dorsi/plantarflexion; Lateral  Heel Raises  Both;15 reps    Toe Raise  10 reps    Toe Raise Limitations  decline slope    Other Standing Ankle Exercises  wt shift both forward and back as well as Rt and lt     Other Standing Ankle Exercises  squat 2 sets x 10       Ankle Exercises: Supine   Other Supine Ankle Exercises  Prone: PROM for DR; contract/relax               PT Short Term Goals - 09/08/19 1222      PT SHORT TERM GOAL #1   Title  PT ROM in Rt LE is wnl to allow normal gait    Time  4    Period  Weeks    Status  Partially Met   all but DF   Target Date  09/12/19      PT SHORT TERM GOAL #2   Title  Pt to be ambulating without  assistive device and without walking boot    Time  4    Period  Weeks    Status  On-going      PT SHORT TERM GOAL #3   Title  PT pain to be no greater than a 5/10 to allow pt to walk for up to 40 minutes at a time to be able to complete shopping/ housework activity.    Time  4    Period  Weeks    Status  Achieved        PT Long Term Goals - 09/08/19 1223      PT LONG TERM GOAL #1   Title  Pt  strength of LE mm to be at least 4+/5 to allow pt to be able to go up and down 2 flights of steps with one hand rail assist without increased pain.    Time  8    Period  Weeks    Status  On-going      PT LONG TERM GOAL #2   Title  PT pain to be no greater than a 2/10 to allow pt to ambulate/ stand for 2 hours at a time to be ready for RTW duties.    Time  8    Period  Weeks    Status  On-going      PT LONG TERM GOAL #3   Title  PT to be able to single leg stance on both LE for at least 45 seconds to allow pt to be confident walking  up/down hills on uneven terrain.    Time  8    Period  Weeks    Status  On-going            Plan - 09/11/19 1328    Clinical Impression Statement  Added rockerboard to improve weight shifting in standing with HHA.  Pt continues to be limited with active and passive DF, hard end feel.  Added manual to gastroc/soleus to address limitation.  No reports of pain, continues to c/o ankle stiffness    Examination-Activity Limitations  Bathing;Bed Mobility;Caring for Others;Carry;Dressing;Lift;Locomotion Level;Sit;Sleep;Squat;Stairs;Stand;Toileting;Transfers    Examination-Participation Restrictions  Church;Cleaning;Community Activity;Driving;Interpersonal Relationship;Laundry;Meal Prep;Shop;Yard Work    Stability/Clinical Decision Making  Stable/Uncomplicated    Clinical Decision Making  Low    Rehab Potential  Good    PT Frequency  3x / week    PT Duration  8 weeks    PT Treatment/Interventions  ADLs/Self Care Home Management;Cryotherapy;Electrical Stimulation;DME Instruction;Gait training;Stair training;Functional mobility   training;Therapeutic activities;Therapeutic exercise;Balance training;Patient/family education;Manual techniques;Manual lymph drainage;Passive range of motion;Scar mobilization;Dry needling    PT Next Visit Plan  Continue with aggresive PROM for dorsiflexion.    PT Home Exercise Plan  ROM, Circles, and isometrics for  dorsiflexion/plantarflexion, ABC, towel crunches, sidelying in/eversion, prone DF/PF>       Patient will benefit from skilled therapeutic intervention in order to improve the following deficits and impairments:  Abnormal gait, Decreased activity tolerance, Decreased balance, Decreased endurance, Decreased knowledge of use of DME, Decreased mobility, Decreased range of motion, Decreased strength, Difficulty walking, Increased edema, Increased fascial restricitons, Pain  Visit Diagnosis: Stiffness of right ankle, not elsewhere classified  Muscle weakness (generalized)  Other abnormalities of gait and mobility  Pain in right ankle and joints of right foot     Problem List Patient Active Problem List   Diagnosis Date Noted  . Closed right trimalleolar fracture, initial encounter 07/17/2019   Casey Cockerham, LPTA; CBIS [phone]  Cockerham, Casey Jo 09/11/2019, 1:31 PM  [facility] [address] [place], [place], [address] Phone: [phone]   Fax:  [phone]  Name: Shasha Redd Sammarco MRN: [id number] Date of Birth: 05/04/1966   

## 2019-09-15 ENCOUNTER — Ambulatory Visit (HOSPITAL_COMMUNITY): Payer: 59 | Admitting: Physical Therapy

## 2019-09-15 ENCOUNTER — Other Ambulatory Visit: Payer: Self-pay

## 2019-09-15 ENCOUNTER — Encounter (HOSPITAL_COMMUNITY): Payer: Self-pay | Admitting: Physical Therapy

## 2019-09-15 DIAGNOSIS — R2689 Other abnormalities of gait and mobility: Secondary | ICD-10-CM | POA: Diagnosis not present

## 2019-09-15 DIAGNOSIS — M25671 Stiffness of right ankle, not elsewhere classified: Secondary | ICD-10-CM | POA: Diagnosis not present

## 2019-09-15 DIAGNOSIS — M25571 Pain in right ankle and joints of right foot: Secondary | ICD-10-CM | POA: Diagnosis not present

## 2019-09-15 DIAGNOSIS — M6281 Muscle weakness (generalized): Secondary | ICD-10-CM

## 2019-09-15 NOTE — Therapy (Signed)
[place], [place], [address] Phone: [phone]   Fax:  [phone]  Physical Therapy Treatment  Patient Details  Name: Laura [name] MRN: [id number] Date of Birth: August 30, 1966 Referring Provider (PT): Victorino December   Encounter Date: 09/15/2019  PT End of Session - 09/15/19 1617    Visit Number  14   Reassess complete visit #9   Number of Visits  24    Date for PT Re-Evaluation  10/14/19    Authorization Type  UMR    PT Start Time  7124    PT Stop Time  5809    PT Time Calculation (min)  44 min    Activity Tolerance  Patient tolerated treatment well;No increased pain;Patient limited by pain   c/o stiffness through session   Behavior During Therapy  [facility] for tasks assessed/performed       Past Medical History:  Diagnosis Date  . Ankle fracture    Right  . ASCUS favor benign 2004   colposcopy negative  . Vertigo    on occasion     Past Surgical History:  Procedure Laterality Date  . ABDOMINAL HYSTERECTOMY  01/2010   Leiomyomata  . CESAREAN SECTION  1991   x1  . COLONOSCOPY N/A 09/03/2014   Procedure: COLONOSCOPY;  Surgeon: Rogene Houston, MD;  [facility];  Service: Endoscopy;  Laterality: N/A;  830  . COLPOSCOPY    . ORIF ANKLE FRACTURE Right 07/17/2019   Procedure: OPEN REDUCTION INTERNAL FIXATION (ORIF) ANKLE FRACTURE;  Surgeon: Nicholes Stairs, MD;  Location: [place];  Service: Orthopedics;  Laterality: Right;  90 mins  . TUBAL LIGATION      There were no vitals filed for this visit.  Subjective Assessment - 09/15/19 1532    Subjective  PT states that she is ready to get rid of the boot    Pertinent History  unremarkable    Limitations  Sitting;Lifting;Standing;Walking;House hold activities    How long can you sit comfortably?  30    How long can you stand comfortably?  10 minutes putting all wt on Lt LE    How long can you walk comfortably?  using crutches NWB has only  walked about 50 ft at a time    Patient Stated Goals  drive, walk without crutches or boot, bike    Currently in Pain?  No/denies   just tightness   Pain Onset  1 to 4 weeks ago             [facility] Adult PT Treatment/Exercise - 09/15/19 0001      Ambulation/Gait   Ambulation/Gait  Yes    Ambulation/Gait Assistance  6: Modified independent (Device/Increase time)    Ambulation/Gait Assistance Details  two crutches no boot     Ambulation Distance (Feet)  226 Feet    Gait Pattern  Step-through pattern      Exercises   Exercises  Ankle      Manual Therapy   Manual Therapy  --    Manual therapy comments  --    Soft tissue mobilization  --    Passive ROM  --      Ankle Exercises: Stretches   Plantar Fascia Stretch  3 reps;30 seconds    Slant Board Stretch  3 reps;30 seconds    Other Stretch  Knee drivers; 3 sets 30 seconds      Ankle Exercises: Standing   BAPS  Level 2;Standing;5 reps  Rocker Board  1 minute    Rocker Board Limitations  Dorsi/plantarflexion; Lateral    Heel Raises  Both;15 reps    Heel Raises Limitations  followed by a squat     Toe Raise  --    Toe Raise Limitations  --    Other Standing Ankle Exercises  --    Other Standing Ankle Exercises  squat x 10       Ankle Exercises: Supine   Other Supine Ankle Exercises  Prone:Dorsi/plantar flexion x 10 f/b terminal extension       Ankle Exercises: Sidelying   Other Sidelying Ankle Exercises  AROM Dorsiflexion -6-50               PT Short Term Goals - 09/08/19 1222      PT SHORT TERM GOAL #1   Title  PT ROM in Rt LE is wnl to allow normal gait    Time  4    Period  Weeks    Status  Partially Met   all but DF   Target Date  09/12/19      PT SHORT TERM GOAL #2   Title  Pt to be ambulating without  assistive device and without walking boot    Time  4    Period  Weeks    Status  On-going      PT SHORT TERM GOAL #3   Title  PT pain to be no greater than a 5/10 to allow pt to walk for up to  40 minutes at a time to be able to complete shopping/ housework activity.    Time  4    Period  Weeks    Status  Achieved        PT Long Term Goals - 09/08/19 1223      PT LONG TERM GOAL #1   Title  Pt strength of LE mm to be at least 4+/5 to allow pt to be able to go up and down 2 flights of steps with one hand rail assist without increased pain.    Time  8    Period  Weeks    Status  On-going      PT LONG TERM GOAL #2   Title  PT pain to be no greater than a 2/10 to allow pt to ambulate/ stand for 2 hours at a time to be ready for RTW duties.    Time  8    Period  Weeks    Status  On-going      PT LONG TERM GOAL #3   Title  PT to be able to single leg stance on both LE for at least 45 seconds to allow pt to be confident walking  up/down hills on uneven terrain.    Time  8    Period  Weeks    Status  On-going            Plan - 09/15/19 1617    Clinical Impression Statement  Gait trained without the camboot with no pain and good form.  PT ROM has improved to -6 degrees.  PT will be progressed to WBAT with crutches without boot.  Once pt can walk smoothlty with one crutch we will progress to one crutch    Examination-Activity Limitations  Bathing;Bed Mobility;Caring for Others;Carry;Dressing;Lift;Locomotion Level;Sit;Sleep;Squat;Stairs;Stand;Toileting;Transfers    Examination-Participation Restrictions  Church;Cleaning;Community Activity;Driving;Interpersonal Relationship;Laundry;Meal Prep;Shop;Yard Work    Stability/Clinical Decision Making  Stable/Uncomplicated    Rehab Potential  Good  PT Frequency  3x / week    PT Duration  8 weeks    PT Treatment/Interventions  ADLs/Self Care Home Management;Cryotherapy;Electrical Stimulation;DME Instruction;Gait training;Stair training;Functional mobility training;Therapeutic activities;Therapeutic exercise;Balance training;Patient/family education;Manual techniques;Manual lymph drainage;Passive range of motion;Scar mobilization;Dry  needling    PT Next Visit Plan  Continue with aggresive PROM for dorsiflexion. Continue periodic gt training with one crutch to see if pt is ready .    PT Home Exercise Plan  ROM, Circles, and isometrics for dorsiflexion/plantarflexion, ABC, towel crunches, sidelying in/eversion, prone DF/PF>       Patient will benefit from skilled therapeutic intervention in order to improve the following deficits and impairments:  Abnormal gait, Decreased activity tolerance, Decreased balance, Decreased endurance, Decreased knowledge of use of DME, Decreased mobility, Decreased range of motion, Decreased strength, Difficulty walking, Increased edema, Increased fascial restricitons, Pain  Visit Diagnosis: Stiffness of right ankle, not elsewhere classified  Muscle weakness (generalized)  Other abnormalities of gait and mobility     Problem List Patient Active Problem List   Diagnosis Date Noted  . Closed right trimalleolar fracture, initial encounter 07/17/2019   Rayetta Humphrey, PT CLT [phone] 09/15/2019, 4:25 PM  [place] [address] [place], [place], [address] Phone: [phone]   Fax:  [phone]  Name: Laura [name] MRN: [id number] Date of Birth: 1966/08/25

## 2019-09-16 ENCOUNTER — Encounter (HOSPITAL_COMMUNITY): Payer: 59

## 2019-09-18 ENCOUNTER — Other Ambulatory Visit: Payer: Self-pay

## 2019-09-18 ENCOUNTER — Encounter (HOSPITAL_COMMUNITY): Payer: Self-pay | Admitting: Physical Therapy

## 2019-09-18 ENCOUNTER — Ambulatory Visit (HOSPITAL_COMMUNITY): Payer: 59 | Admitting: Physical Therapy

## 2019-09-18 DIAGNOSIS — R2689 Other abnormalities of gait and mobility: Secondary | ICD-10-CM

## 2019-09-18 DIAGNOSIS — M25671 Stiffness of right ankle, not elsewhere classified: Secondary | ICD-10-CM | POA: Diagnosis not present

## 2019-09-18 DIAGNOSIS — M6281 Muscle weakness (generalized): Secondary | ICD-10-CM | POA: Diagnosis not present

## 2019-09-18 DIAGNOSIS — M25571 Pain in right ankle and joints of right foot: Secondary | ICD-10-CM

## 2019-09-18 NOTE — Therapy (Signed)
[place], [place], [address] Phone: [phone]   Fax:  [phone]  Physical Therapy Treatment  Patient Details  Name: Laura [name] MRN: [id number] Date of Birth: [birth year] Referring Provider (PT): Victorino December   Encounter Date: 09/18/2019  PT End of Session - 09/18/19 1627    Visit Number  15   Reassess complete visit #9   Number of Visits  24    Date for PT Re-Evaluation  10/14/19    Authorization Type  UMR; prog note needed at visit 19    PT Start Time  1533    PT Stop Time  1620    PT Time Calculation (min)  47 min    Activity Tolerance  Patient tolerated treatment well;No increased pain;Patient limited by pain   c/o stiffness through session   Behavior During Therapy  [facility] for tasks assessed/performed       Past Medical History:  Diagnosis Date  . Ankle fracture    Right  . ASCUS favor benign 2004   colposcopy negative  . Vertigo    on occasion     Past Surgical History:  Procedure Laterality Date  . ABDOMINAL HYSTERECTOMY  01/2010   Leiomyomata  . CESAREAN SECTION  1991   x1  . COLONOSCOPY N/A 09/03/2014   Procedure: COLONOSCOPY;  Surgeon: Rogene Houston, MD;  [facility];  Service: Endoscopy;  Laterality: N/A;  830  . COLPOSCOPY    . ORIF ANKLE FRACTURE Right 07/17/2019   Procedure: OPEN REDUCTION INTERNAL FIXATION (ORIF) ANKLE FRACTURE;  Surgeon: Nicholes Stairs, MD;  Location: [place];  Service: Orthopedics;  Laterality: Right;  90 mins  . TUBAL LIGATION      There were no vitals filed for this visit.  Subjective Assessment - 09/18/19 1535    Subjective  PT states that she is feeling better without the boot    Pertinent History  unremarkable    Limitations  Sitting;Lifting;Standing;Walking;House hold activities    How long can you sit comfortably?  30    How long can you stand comfortably?  15 minutes putting most wt on LT but some on RT    How long can you  walk comfortably?  PT using crutches with PWB without boot for five minutes.    Patient Stated Goals  drive, walk without crutches or boot, bike    Pain Onset  1 to 4 weeks ago            [facility] Adult PT Treatment/Exercise - 09/18/19 0001      Ambulation/Gait   Ambulation/Gait  Yes    Ambulation/Gait Assistance  6: Modified independent (Device/Increase time)    Ambulation Distance (Feet)  100 Feet    Gait Pattern  Step-through pattern    Gait Comments  working on heel strike toe push off       Exercises   Exercises  Ankle      Ankle Exercises: Stretches   Plantar Fascia Stretch  3 reps;30 seconds    Slant Board Stretch  3 reps;30 seconds    Other Stretch  Knee drivers; 3 sets 30 seconds      Ankle Exercises: Standing   BAPS  Level 2;Standing;5 reps    Rocker Board  1 minute    Rocker Board Limitations  Dorsi/plantarflexion; Lateral    Heel Raises  Both;15 reps    Heel Raises Limitations  --    Other Standing Ankle Exercises  Rt foot on washcloth slide RT foot back as far as possible until heel begins to come up hold repeat x 10     Other Standing Ankle Exercises  squat     picking items off of 4" step x 6     Ankle Exercises: Supine   Other Supine Ankle Exercises  Prone:Dorsi/plantar flexion x 10 f/b terminal extension       Ankle Exercises: Sidelying   Other Sidelying Ankle Exercises  AROM Dorsiflexion -6-50          Balance Exercises - 09/18/19 1627      Balance Exercises: Standing   Tandem Stance  Eyes open;Intermittent upper extremity support;5 reps;Foam/compliant surface   With head turns         PT Short Term Goals - 09/18/19 1632      PT SHORT TERM GOAL #1   Title  PT ROM in Rt LE is wnl to allow normal gait    Time  4    Period  Weeks    Status  Partially Met   all but DF   Target Date  09/12/19      PT SHORT TERM GOAL #2   Title  Pt to be ambulating without  assistive device and without walking boot    Time  4    Period  Weeks    Status   Partially Met   10/13: crutches but no boot.     PT SHORT TERM GOAL #3   Title  PT pain to be no greater than a 5/10 to allow pt to walk for up to 40 minutes at a time to be able to complete shopping/ housework activity.    Time  4    Period  Weeks    Status  Achieved        PT Long Term Goals - 09/08/19 1223      PT LONG TERM GOAL #1   Title  Pt strength of LE mm to be at least 4+/5 to allow pt to be able to go up and down 2 flights of steps with one hand rail assist without increased pain.    Time  8    Period  Weeks    Status  On-going      PT LONG TERM GOAL #2   Title  PT pain to be no greater than a 2/10 to allow pt to ambulate/ stand for 2 hours at a time to be ready for RTW duties.    Time  8    Period  Weeks    Status  On-going      PT LONG TERM GOAL #3   Title  PT to be able to single leg stance on both LE for at least 45 seconds to allow pt to be confident walking  up/down hills on uneven terrain.    Time  8    Period  Weeks    Status  On-going            Plan - 09/18/19 1628    Clinical Impression Statement  Pt to department without Cam Boot, noted decreased heelstrike therefore gt trained with proper heel toe coordination.  Added squatting with items on 4" step, added foam with tandem stance for increased challenge and added hip extension with heel on the floor to promote more normal motion and increse Dorsiflexion.    Examination-Activity Limitations  Bathing;Bed Mobility;Caring for Others;Carry;Dressing;Lift;Locomotion Level;Sit;Sleep;Squat;Stairs;Stand;Toileting;Transfers    Examination-Participation Restrictions  Church;Cleaning;Community Activity;Driving;Interpersonal Relationship;Laundry;Meal Prep;Shop;Yard Work  Stability/Clinical Decision Making  Stable/Uncomplicated    Rehab Potential  Good    PT Frequency  3x / week    PT Duration  8 weeks    PT Treatment/Interventions  ADLs/Self Care Home Management;Cryotherapy;Electrical Stimulation;DME  Instruction;Gait training;Stair training;Functional mobility training;Therapeutic activities;Therapeutic exercise;Balance training;Patient/family education;Manual techniques;Manual lymph drainage;Passive range of motion;Scar mobilization;Dry needling    PT Next Visit Plan  Continue with aggresive PROM for dorsiflexion. Continue periodic gt training with one crutch to see if pt is ready .    PT Home Exercise Plan  ROM, Circles, and isometrics for dorsiflexion/plantarflexion, ABC, towel crunches, sidelying in/eversion, prone DF/PF>       Patient will benefit from skilled therapeutic intervention in order to improve the following deficits and impairments:  Abnormal gait, Decreased activity tolerance, Decreased balance, Decreased endurance, Decreased knowledge of use of DME, Decreased mobility, Decreased range of motion, Decreased strength, Difficulty walking, Increased edema, Increased fascial restricitons, Pain  Visit Diagnosis: Stiffness of right ankle, not elsewhere classified  Muscle weakness (generalized)  Other abnormalities of gait and mobility  Pain in right ankle and joints of right foot     Problem List Patient Active Problem List   Diagnosis Date Noted  . Closed right trimalleolar fracture, initial encounter 07/17/2019    Rayetta Humphrey, PT CLT [phone] 09/18/2019, 4:33 PM  [place] [address] [place], [place], [address] Phone: [phone]   Fax:  [phone]  Name: Laura [name] MRN: [id number] Date of Birth: 1966-09-19

## 2019-09-19 ENCOUNTER — Encounter (HOSPITAL_COMMUNITY): Payer: Self-pay | Admitting: Physical Therapy

## 2019-09-19 ENCOUNTER — Ambulatory Visit (HOSPITAL_COMMUNITY): Payer: 59 | Admitting: Physical Therapy

## 2019-09-19 DIAGNOSIS — M25571 Pain in right ankle and joints of right foot: Secondary | ICD-10-CM

## 2019-09-19 DIAGNOSIS — M6281 Muscle weakness (generalized): Secondary | ICD-10-CM | POA: Diagnosis not present

## 2019-09-19 DIAGNOSIS — R2689 Other abnormalities of gait and mobility: Secondary | ICD-10-CM

## 2019-09-19 DIAGNOSIS — M25671 Stiffness of right ankle, not elsewhere classified: Secondary | ICD-10-CM

## 2019-09-19 NOTE — Therapy (Signed)
[place] [address] [place], [place], [address] Phone: [phone]   Fax:  [phone]  Physical Therapy Treatment  Patient Details  Name: Laura [name] MRN: [id number] Date of Birth: 04-Mar-1966 Referring Provider (PT): Victorino December   Encounter Date: 09/19/2019  PT End of Session - 09/19/19 1041    Visit Number  16   Reassess complete visit #9   Number of Visits  24    Date for PT Re-Evaluation  10/14/19    Authorization Type  UMR; prog note needed at visit 19    PT Start Time  1003    PT Stop Time  1050    PT Time Calculation (min)  47 min    Activity Tolerance  Patient tolerated treatment well;No increased pain;Patient limited by pain   c/o stiffness through session   Behavior During Therapy  [facility] for tasks assessed/performed       Past Medical History:  Diagnosis Date  . Ankle fracture    Right  . ASCUS favor benign 2004   colposcopy negative  . Vertigo    on occasion     Past Surgical History:  Procedure Laterality Date  . ABDOMINAL HYSTERECTOMY  01/2010   Leiomyomata  . CESAREAN SECTION  1991   x1  . COLONOSCOPY N/A 09/03/2014   Procedure: COLONOSCOPY;  Surgeon: Rogene Houston, MD;  [facility];  Service: Endoscopy;  Laterality: N/A;  830  . COLPOSCOPY    . ORIF ANKLE FRACTURE Right 07/17/2019   Procedure: OPEN REDUCTION INTERNAL FIXATION (ORIF) ANKLE FRACTURE;  Surgeon: Nicholes Stairs, MD;  Location: [place];  Service: Orthopedics;  Laterality: Right;  90 mins  . TUBAL LIGATION      There were no vitals filed for this visit.  Subjective Assessment - 09/19/19 1001    Subjective  PT complaining of the swelling stating it just won't go away    Pertinent History  unremarkable    Limitations  Sitting;Lifting;Standing;Walking;House hold activities    How long can you sit comfortably?  30    How long can you stand comfortably?  15 minutes putting most wt on LT but some on RT    How long  can you walk comfortably?  PT using crutches with PWB without boot for five minutes.    Patient Stated Goals  drive, walk without crutches or boot, bike    Currently in Pain?  Yes    Pain Score  4     Pain Location  Ankle    Pain Orientation  Right    Pain Descriptors / Indicators  Tightness    Pain Type  Acute pain    Pain Onset  1 to 4 weeks ago         [facility] PT Assessment - 09/19/19 0001      AROM   Right Ankle Dorsiflexion  -8    Right Ankle Plantar Flexion  60                   [facility] Adult PT Treatment/Exercise - 09/19/19 0001      Exercises   Exercises  Ankle      Manual Therapy   Manual Therapy  Soft tissue mobilization;Passive ROM    Manual therapy comments  done seperate from all other aspects of treatment.     Edema Management  retro massage to decrease swelling     Soft tissue mobilization  Prone gastroc/soleus complex  Passive ROM  prone DF PROM      Ankle Exercises: Stretches   Plantar Fascia Stretch  3 reps;30 seconds    Slant Board Stretch  3 reps;30 seconds      Ankle Exercises: Seated   Other Seated Ankle Exercises  PROM for ankle and  toe       Ankle Exercises: Supine   Other Supine Ankle Exercises  contract relax to increase ankle       Ankle Exercises: Sidelying   Other Sidelying Ankle Exercises  AROM Dorsiflexion -6-50    Other Sidelying Ankle Exercises  prone Dorsi/plantarflexion x 10                PT Short Term Goals - 09/18/19 1632      PT SHORT TERM GOAL #1   Title  PT ROM in Rt LE is wnl to allow normal gait    Time  4    Period  Weeks    Status  Partially Met   all but DF   Target Date  09/12/19      PT SHORT TERM GOAL #2   Title  Pt to be ambulating without  assistive device and without walking boot    Time  4    Period  Weeks    Status  Partially Met   10/13: crutches but no boot.     PT SHORT TERM GOAL #3   Title  PT pain to be no greater than a 5/10 to allow pt to walk for up to 40 minutes at a  time to be able to complete shopping/ housework activity.    Time  4    Period  Weeks    Status  Achieved        PT Long Term Goals - 09/08/19 1223      PT LONG TERM GOAL #1   Title  Pt strength of LE mm to be at least 4+/5 to allow pt to be able to go up and down 2 flights of steps with one hand rail assist without increased pain.    Time  8    Period  Weeks    Status  On-going      PT LONG TERM GOAL #2   Title  PT pain to be no greater than a 2/10 to allow pt to ambulate/ stand for 2 hours at a time to be ready for RTW duties.    Time  8    Period  Weeks    Status  On-going      PT LONG TERM GOAL #3   Title  PT to be able to single leg stance on both LE for at least 45 seconds to allow pt to be confident walking  up/down hills on uneven terrain.    Time  8    Period  Weeks    Status  On-going            Plan - 09/19/19 1042    Clinical Impression Statement  ROM taken today with no improvement in dorsiflexion.  PT and therapist discussed options re JAS vs. night splint pt prefers to go with JAS therefore order will be faxed to MD.  PT complaining of more stiffness therefore treatment started with manual to decrease swelling.  Contract relax demonstrates hard end feel.    Examination-Activity Limitations  Bathing;Bed Mobility;Caring for Others;Carry;Dressing;Lift;Locomotion Level;Sit;Sleep;Squat;Stairs;Stand;Toileting;Transfers    Examination-Participation Restrictions  Church;Cleaning;Community Activity;Driving;Interpersonal Relationship;Laundry;Meal Prep;Shop;Yard Work    [employer]  Stable/Uncomplicated    Rehab Potential  Good    PT Frequency  3x / week    PT Duration  8 weeks    PT Treatment/Interventions  ADLs/Self Care Home Management;Cryotherapy;Electrical Stimulation;DME Instruction;Gait training;Stair training;Functional mobility training;Therapeutic activities;Therapeutic exercise;Balance training;Patient/family education;Manual  techniques;Manual lymph drainage;Passive range of motion;Scar mobilization;Dry needling    PT Next Visit Plan  Measure for JAS send order off once it is returned.   Stress Dorsiflexion    PT Home Exercise Plan  ROM, Circles, and isometrics for dorsiflexion/plantarflexion, ABC, towel crunches, sidelying in/eversion, prone DF/PF>       Patient will benefit from skilled therapeutic intervention in order to improve the following deficits and impairments:  Abnormal gait, Decreased activity tolerance, Decreased balance, Decreased endurance, Decreased knowledge of use of DME, Decreased mobility, Decreased range of motion, Decreased strength, Difficulty walking, Increased edema, Increased fascial restricitons, Pain  Visit Diagnosis: Stiffness of right ankle, not elsewhere classified  Muscle weakness (generalized)  Other abnormalities of gait and mobility  Pain in right ankle and joints of right foot     Problem List Patient Active Problem List   Diagnosis Date Noted  . Closed right trimalleolar fracture, initial encounter 07/17/2019    Rayetta Humphrey, PT CLT [phone] 09/19/2019, 10:49 AM  [place], [place], [address] Phone: [phone]   Fax:  [phone]  Name: Deardra Hinkley MRN: [id number] Date of Birth: 02/05/1966

## 2019-09-22 ENCOUNTER — Ambulatory Visit (HOSPITAL_COMMUNITY): Payer: 59 | Admitting: Physical Therapy

## 2019-09-22 ENCOUNTER — Encounter (HOSPITAL_COMMUNITY): Payer: Self-pay | Admitting: Physical Therapy

## 2019-09-22 ENCOUNTER — Other Ambulatory Visit: Payer: Self-pay

## 2019-09-22 DIAGNOSIS — M25671 Stiffness of right ankle, not elsewhere classified: Secondary | ICD-10-CM

## 2019-09-22 DIAGNOSIS — M6281 Muscle weakness (generalized): Secondary | ICD-10-CM

## 2019-09-22 DIAGNOSIS — M25571 Pain in right ankle and joints of right foot: Secondary | ICD-10-CM | POA: Diagnosis not present

## 2019-09-22 DIAGNOSIS — R2689 Other abnormalities of gait and mobility: Secondary | ICD-10-CM

## 2019-09-22 NOTE — Therapy (Signed)
[place] [address] [place], [place], [address] Phone: [phone]   Fax:  [phone]  Physical Therapy Treatment  Patient Details  Name: Laura [name] MRN: [id number] Date of Birth: 17-Aug-1966 Referring Provider (PT): Victorino December   Encounter Date: 09/22/2019  PT End of Session - 09/22/19 1240    Visit Number  62   Reassess complete visit #9   Number of Visits  24    Date for PT Re-Evaluation  10/14/19    Authorization Type  UMR; prog note needed at visit 19    PT Start Time  1055    PT Stop Time  1138    PT Time Calculation (min)  43 min    Activity Tolerance  Patient tolerated treatment well;No increased pain;Patient limited by pain   c/o stiffness through session   Behavior During Therapy  [facility] for tasks assessed/performed       Past Medical History:  Diagnosis Date  . Ankle fracture    Right  . ASCUS favor benign 2004   colposcopy negative  . Vertigo    on occasion     Past Surgical History:  Procedure Laterality Date  . ABDOMINAL HYSTERECTOMY  01/2010   Leiomyomata  . CESAREAN SECTION  1991   x1  . COLONOSCOPY N/A 09/03/2014   Procedure: COLONOSCOPY;  Surgeon: Rogene Houston, MD;  [facility];  Service: Endoscopy;  Laterality: N/A;  830  . COLPOSCOPY    . ORIF ANKLE FRACTURE Right 07/17/2019   Procedure: OPEN REDUCTION INTERNAL FIXATION (ORIF) ANKLE FRACTURE;  Surgeon: Nicholes Stairs, MD;  Location: [place];  Service: Orthopedics;  Laterality: Right;  90 mins  . TUBAL LIGATION      There were no vitals filed for this visit.  Subjective Assessment - 09/22/19 1229    Subjective  PT states that she worked hard over the weekend; she is concerned that the swelling is not going down.  Therapist tried to explain that as long as she is continuing to increase her activity level she is going to have swelling due to the ankle having to get use to the increased activity.    Pertinent History   unremarkable    Limitations  Sitting;Lifting;Standing;Walking;House hold activities    How long can you sit comfortably?  30    How long can you stand comfortably?  15 minutes putting most wt on LT but some on RT    How long can you walk comfortably?  PT using crutches with PWB without boot for five minutes.    Patient Stated Goals  drive, walk without crutches or boot, bike    Currently in Pain?  No/denies   Just stiffness   Pain Onset  1 to 4 weeks ago                       [facility] Adult PT Treatment/Exercise - 09/22/19 0001      Exercises   Exercises  Ankle      Manual Therapy   Manual Therapy  Joint mobilization;Soft tissue mobilization;Muscle Energy Technique    Manual therapy comments  done seperate from all other aspects of treatment.     Edema Management  retro massage to decrease swelling     Joint Mobilization  grade II to cunionaviculat jt ANt/POstand tibiotalar jt     Soft tissue mobilization  Prone gastroc/soleus complex    Passive ROM  prone DF PROM  Muscle Energy Technique  contract relax both supine and prone to improve dorsiflexionl       Gastroc.(slant board), and plantar fascia stretch 3 x 45" each         PT Short Term Goals - 09/18/19 1632      PT SHORT TERM GOAL #1   Title  PT ROM in Rt LE is wnl to allow normal gait    Time  4    Period  Weeks    Status  Partially Met   all but DF   Target Date  09/12/19      PT SHORT TERM GOAL #2   Title  Pt to be ambulating without  assistive device and without walking boot    Time  4    Period  Weeks    Status  Partially Met   10/13: crutches but no boot.     PT SHORT TERM GOAL #3   Title  PT pain to be no greater than a 5/10 to allow pt to walk for up to 40 minutes at a time to be able to complete shopping/ housework activity.    Time  4    Period  Weeks    Status  Achieved        PT Long Term Goals - 09/08/19 1223      PT LONG TERM GOAL #1   Title  Pt strength of LE mm to be  at least 4+/5 to allow pt to be able to go up and down 2 flights of steps with one hand rail assist without increased pain.    Time  8    Period  Weeks    Status  On-going      PT LONG TERM GOAL #2   Title  PT pain to be no greater than a 2/10 to allow pt to ambulate/ stand for 2 hours at a time to be ready for RTW duties.    Time  8    Period  Weeks    Status  On-going      PT LONG TERM GOAL #3   Title  PT to be able to single leg stance on both LE for at least 45 seconds to allow pt to be confident walking  up/down hills on uneven terrain.    Time  8    Period  Weeks    Status  On-going            Plan - 09/22/19 1241    Clinical Impression Statement  JT continues to have a hard endfeel, majority of treatment with contract/relax and jt mobs to attempt to improve dorisflexion.  PT was measured for JAS and will send off if MD returns request.    Examination-Activity Limitations  Bathing;Bed Mobility;Caring for Others;Carry;Dressing;Lift;Locomotion Level;Sit;Sleep;Squat;Stairs;Stand;Toileting;Transfers    Examination-Participation Restrictions  Church;Cleaning;Community Activity;Driving;Interpersonal Relationship;Laundry;Meal Prep;Shop;Yard Work    Stability/Clinical Decision Making  Stable/Uncomplicated    Rehab Potential  Good    PT Frequency  3x / week    PT Duration  8 weeks    PT Treatment/Interventions  ADLs/Self Care Home Management;Cryotherapy;Electrical Stimulation;DME Instruction;Gait training;Stair training;Functional mobility training;Therapeutic activities;Therapeutic exercise;Balance training;Patient/family education;Manual techniques;Manual lymph drainage;Passive range of motion;Scar mobilization;Dry needling    PT Next Visit Plan  .   Stress Dorsiflexion    PT Home Exercise Plan  ROM, Circles, and isometrics for dorsiflexion/plantarflexion, ABC, towel crunches, sidelying in/eversion, prone DF/PF>       Patient will benefit from skilled therapeutic intervention in  order  to improve the following deficits and impairments:  Abnormal gait, Decreased activity tolerance, Decreased balance, Decreased endurance, Decreased knowledge of use of DME, Decreased mobility, Decreased range of motion, Decreased strength, Difficulty walking, Increased edema, Increased fascial restricitons, Pain  Visit Diagnosis: Stiffness of right ankle, not elsewhere classified  Muscle weakness (generalized)  Other abnormalities of gait and mobility  Pain in right ankle and joints of right foot     Problem List Patient Active Problem List   Diagnosis Date Noted  . Closed right trimalleolar fracture, initial encounter 07/17/2019   Rayetta Humphrey, PT CLT [phone] 09/22/2019, 12:44 PM  [place], [place], [address] Phone: [phone]   Fax:  [phone]  Name: Ivalee Strauser MRN: [id number] Date of Birth: 1966-05-22

## 2019-09-23 ENCOUNTER — Telehealth (HOSPITAL_COMMUNITY): Payer: Self-pay | Admitting: Physical Therapy

## 2019-09-23 ENCOUNTER — Encounter (HOSPITAL_COMMUNITY): Payer: 59

## 2019-09-23 NOTE — Telephone Encounter (Signed)
L:/m ask pt to change her apptment time to [facility] at  1pm on 09/26/2019- waiting on confirmation from patient that this will work for her. NF 09/23/2019@5 :22pm

## 2019-09-25 ENCOUNTER — Encounter (HOSPITAL_COMMUNITY): Payer: Self-pay | Admitting: Physical Therapy

## 2019-09-25 ENCOUNTER — Ambulatory Visit (HOSPITAL_COMMUNITY): Payer: 59 | Admitting: Physical Therapy

## 2019-09-25 ENCOUNTER — Other Ambulatory Visit: Payer: Self-pay

## 2019-09-25 DIAGNOSIS — M6281 Muscle weakness (generalized): Secondary | ICD-10-CM | POA: Diagnosis not present

## 2019-09-25 DIAGNOSIS — R2689 Other abnormalities of gait and mobility: Secondary | ICD-10-CM

## 2019-09-25 DIAGNOSIS — M25671 Stiffness of right ankle, not elsewhere classified: Secondary | ICD-10-CM

## 2019-09-25 DIAGNOSIS — M25571 Pain in right ankle and joints of right foot: Secondary | ICD-10-CM | POA: Diagnosis not present

## 2019-09-25 NOTE — Patient Instructions (Addendum)
Heel Raise: Bilateral (Standing)    Rise on balls of feet. Repeat __10__ times per set. Do __1__ sets per session. Do ___2_ sessions per day.  http://orth.exer.us/38   Copyright  VHI. All rights reserved.  Toe Raise (Standing)    Rock back on heels. Repeat __10__ times per set. Do __1__ sets per session. Do __2__ sessions per day.  http://orth.exer.us/42   Copyright  VHI. All rights reserved.  Strengthening: Hip Abduction (Side-Lying)    Tighten muscles on front of left thigh, then lift leg _15___ inches from surface, keeping knee locked.  Repeat _20__ times per set. Do __1__ sets per session. Do _2___ sessions per day.  http://orth.exer.us/622   Copyright  VHI. All rights reserved.  Strengthening: Hip Extension (Prone)    Tighten muscles on front of right  thigh, then lift leg _3___ inches from surface, keeping knee locked. Repeat __20__ times per set. Do __1__ sets per session. Do __2__ sessions per day.  http://orth.exer.us/620   Copyright  VHI. All rights reserved.  Self-Mobilization: Knee Flexion (Prone)   With 4 to 5 lb on ankle  Bring right heel toward buttocks as close as possible. Hold __3__ seconds. Relax. Repeat __10__ times per set. Do _1___ sets per session. Do _2___ sessions per day.  http://orth.exer.us/596   Copyright  VHI. All rights reserved.

## 2019-09-25 NOTE — Therapy (Signed)
[facility] [address] [place], [place], [address] Phone: [phone]   Fax:  [phone]  Physical Therapy Treatment  Patient Details  Name: Laura [name] MRN: [id number] Date of Birth: 12/19/1965 Referring Provider (PT): Victorino December   Encounter Date: 09/25/2019 Progress Note Reporting Period 09/04/2019 to 09/25/2019  See note below for Objective Data and Assessment of Progress/Goals.      PT End of Session - 09/25/19 1159    Visit Number  18   Reassess complete visit #9   Number of Visits  24    Date for PT Re-Evaluation  10/14/19    Authorization Type  UMR; prog note needed at visit 28 cert needed 66/0    PT Start Time  1045    PT Stop Time  1140    PT Time Calculation (min)  55 min    Activity Tolerance  Patient tolerated treatment well;No increased pain;Patient limited by pain   c/o stiffness through session   Behavior During Therapy  [facility] for tasks assessed/performed       Past Medical History:  Diagnosis Date  . Ankle fracture    Right  . ASCUS favor benign 2004   colposcopy negative  . Vertigo    on occasion     Past Surgical History:  Procedure Laterality Date  . ABDOMINAL HYSTERECTOMY  01/2010   Leiomyomata  . CESAREAN SECTION  1991   x1  . COLONOSCOPY N/A 09/03/2014   Procedure: COLONOSCOPY;  Surgeon: Rogene Houston, MD;  [facility];  Service: Endoscopy;  Laterality: N/A;  830  . COLPOSCOPY    . ORIF ANKLE FRACTURE Right 07/17/2019   Procedure: OPEN REDUCTION INTERNAL FIXATION (ORIF) ANKLE FRACTURE;  Surgeon: Nicholes Stairs, MD;  Location: [facility];  Service: Orthopedics;  Laterality: Right;  90 mins  . TUBAL LIGATION      There were no vitals filed for this visit.      [facility] PT Assessment - 09/25/19 0001      Assessment   Medical Diagnosis  Rt ankle fx with ORIF     Referring Provider (PT)  Victorino December    Onset Date/Surgical Date  07/17/19    Next MD Visit  Mordecai Rasmussen     Prior Therapy  acute      Precautions   Precautions  Fall      Restrictions   Weight Bearing Restrictions  Yes    RLE Weight Bearing  Non weight bearing      [place] residence    Home Access  Stairs to enter      Prior Function   Level of Independence  Independent    Vocation  Full time employment    Vocation Requirements  sedentary       Cognition   Overall Cognitive Status  Within Functional Limits for tasks assessed      Observation/Other Assessments   Focus on Therapeutic Outcomes (FOTO)   41 was 14       Observation/Other Assessments-Edema    Edema  Figure 8      Figure 8 Edema   Figure 8 - Right   48.8   was 50.5 on 10/1   Figure 8 - Left   46      AROM   Right Ankle Dorsiflexion  -7   10/1 was -9   Right Ankle Plantar Flexion  55    Right Ankle Inversion  25  was 20 at last prog    Right Ankle Eversion  15   was 15    Left Ankle Dorsiflexion  15    Left Ankle Plantar Flexion  60    Left Ankle Inversion  30    Left Ankle Eversion  25      Strength   Right Ankle Dorsiflexion  4-/5   was 3+    Right Ankle Plantar Flexion  4-/5    Right Ankle Inversion  4-/5   was 3+    Right Ankle Eversion  4-/5   was 3+    Left Ankle Dorsiflexion  5/5    Left Ankle Plantar Flexion  5/5    Left Ankle Inversion  5/5    Left Ankle Eversion  5/5      Ambulation/Gait   Ambulation/Gait  Yes    Ambulation/Gait Assistance  6: Modified independent (Device/Increase time)                   [facility] Adult PT Treatment/Exercise - 09/25/19 0001      Ambulation/Gait   Ambulation Distance (Feet)  100 Feet    Assistive device  Crutches    Gait Pattern  Step-to pattern;Step-through pattern    Gait Comments  WBAT with crutches       Exercises   Exercises  Ankle      Manual Therapy   Manual Therapy  Joint mobilization;Soft tissue mobilization;Muscle Energy Technique    Manual therapy comments  done seperate from all other  aspects of treatment.     Edema Management  retro massage to decrease swelling     Joint Mobilization  grade II to cunionaviculat jt ANt/POstand tibiotalar jt     Soft tissue mobilization  Prone gastroc/soleus complex    Passive ROM  prone DF PROM    Muscle Energy Technique  contract relax both supine and prone to improve dorsiflexionl      Ankle Exercises: Standing   Heel Raises  15 reps    Other Standing Ankle Exercises  squat     picking items off of 4" step x 6     Ankle Exercises: Supine   Other Supine Ankle Exercises  Prone:Dorsi/plantar flexion x 10 f/b terminal extension     Other Supine Ankle Exercises  contract relax to increase ankle                PT Short Term Goals - 09/18/19 1632      PT SHORT TERM GOAL #1   Title  PT ROM in Rt LE is wnl to allow normal gait    Time  4    Period  Weeks    Status  Partially Met   all but DF   Target Date  09/12/19      PT SHORT TERM GOAL #2   Title  Pt to be ambulating without  assistive device and without walking boot    Time  4    Period  Weeks    Status  Partially Met   10/13: crutches but no boot.     PT SHORT TERM GOAL #3   Title  PT pain to be no greater than a 5/10 to allow pt to walk for up to 40 minutes at a time to be able to complete shopping/ housework activity.    Time  4    Period  Weeks    Status  Achieved        PT Long Term Goals -  09/08/19 1223      PT LONG TERM GOAL #1   Title  Pt strength of LE mm to be at least 4+/5 to allow pt to be able to go up and down 2 flights of steps with one hand rail assist without increased pain.    Time  8    Period  Weeks    Status  On-going      PT LONG TERM GOAL #2   Title  PT pain to be no greater than a 2/10 to allow pt to ambulate/ stand for 2 hours at a time to be ready for RTW duties.    Time  8    Period  Weeks    Status  On-going      PT LONG TERM GOAL #3   Title  PT to be able to single leg stance on both LE for at least 45 seconds to allow pt  to be confident walking  up/down hills on uneven terrain.    Time  8    Period  Weeks    Status  On-going            Plan - 09/25/19 1207    Clinical Impression Statement  PT reassessed with noted improvement in foto, decreased edema, improved strength and slight improvement of ROM.  Pt is still only able to tolerate approximately 60-70% of weight so is ambulating WBAT with crutches.  Ms. Frese will continue to benefit from skilled therapy to improve ROM and normalize gt.  Hip and knee mm tested and noted to be 4/5 not 5/5 therefore pt was given HEP for improved hip and knee strength.    Examination-Activity Limitations  Bathing;Bed Mobility;Caring for Others;Carry;Dressing;Lift;Locomotion Level;Sit;Sleep;Squat;Stairs;Stand;Toileting;Transfers    Examination-Participation Restrictions  Church;Cleaning;Community Activity;Driving;Interpersonal Relationship;Laundry;Meal Prep;Shop;Yard Work    Stability/Clinical Decision Making  Stable/Uncomplicated    Rehab Potential  Good    PT Frequency  3x / week    PT Duration  8 weeks    PT Treatment/Interventions  ADLs/Self Care Home Management;Cryotherapy;Electrical Stimulation;DME Instruction;Gait training;Stair training;Functional mobility training;Therapeutic activities;Therapeutic exercise;Balance training;Patient/family education;Manual techniques;Manual lymph drainage;Passive range of motion;Scar mobilization;Dry needling    PT Next Visit Plan  .   Stress Dorsiflexion    PT Home Exercise Plan  ROM, Circles, and isometrics for dorsiflexion/plantarflexion, ABC, towel crunches, sidelying in/eversion, prone DF/PF>       Patient will benefit from skilled therapeutic intervention in order to improve the following deficits and impairments:  Abnormal gait, Decreased activity tolerance, Decreased balance, Decreased endurance, Decreased knowledge of use of DME, Decreased mobility, Decreased range of motion, Decreased strength, Difficulty walking,  Increased edema, Increased fascial restricitons, Pain  Visit Diagnosis: Stiffness of right ankle, not elsewhere classified  Muscle weakness (generalized)  Other abnormalities of gait and mobility  Pain in right ankle and joints of right foot     Problem List Patient Active Problem List   Diagnosis Date Noted  . Closed right trimalleolar fracture, initial encounter 07/17/2019    Rayetta Humphrey, PT CLT [phone] 09/25/2019, 12:08 PM  [facility] [place], [place], [address] Phone: [phone]   Fax:  [phone]  Name: Laura [name] MRN: [id number] Date of Birth: 03-Nov-1966

## 2019-09-26 ENCOUNTER — Ambulatory Visit (HOSPITAL_COMMUNITY): Payer: 59 | Admitting: Physical Therapy

## 2019-09-26 ENCOUNTER — Encounter (HOSPITAL_COMMUNITY): Payer: Self-pay | Admitting: Physical Therapy

## 2019-09-26 DIAGNOSIS — M6281 Muscle weakness (generalized): Secondary | ICD-10-CM

## 2019-09-26 DIAGNOSIS — R2689 Other abnormalities of gait and mobility: Secondary | ICD-10-CM | POA: Diagnosis not present

## 2019-09-26 DIAGNOSIS — M25571 Pain in right ankle and joints of right foot: Secondary | ICD-10-CM

## 2019-09-26 DIAGNOSIS — M25671 Stiffness of right ankle, not elsewhere classified: Secondary | ICD-10-CM | POA: Diagnosis not present

## 2019-09-26 NOTE — Therapy (Signed)
[facility] [address] [place], [place], [address] Phone: [phone]   Fax:  [phone]  Physical Therapy Treatment  Patient Details  Name: Laura [name] MRN: [id number] Date of Birth: [birth year] Referring Provider (PT): Victorino December   Encounter Date: 09/26/2019  PT End of Session - 09/26/19 0953    Visit Number  19    Number of Visits  24    Date for PT Re-Evaluation  10/14/19    Authorization Type  UMR; prog note needed at visit 28 cert needed 76/1    PT Start Time  0900    PT Stop Time  0945    PT Time Calculation (min)  45 min    Activity Tolerance  Patient tolerated treatment well   c/o stiffness through session   Behavior During Therapy  [facility] for tasks assessed/performed       Past Medical History:  Diagnosis Date  . Ankle fracture    Right  . ASCUS favor benign 2004   colposcopy negative  . Vertigo    on occasion     Past Surgical History:  Procedure Laterality Date  . ABDOMINAL HYSTERECTOMY  01/2010   Leiomyomata  . CESAREAN SECTION  1991   x1  . COLONOSCOPY N/A 09/03/2014   Procedure: COLONOSCOPY;  Surgeon: Rogene Houston, MD;  [facility];  Service: Endoscopy;  Laterality: N/A;  830  . COLPOSCOPY    . ORIF ANKLE FRACTURE Right 07/17/2019   Procedure: OPEN REDUCTION INTERNAL FIXATION (ORIF) ANKLE FRACTURE;  Surgeon: Nicholes Stairs, MD;  Location: [facility];  Service: Orthopedics;  Laterality: Right;  90 mins  . TUBAL LIGATION      There were no vitals filed for this visit.  Subjective Assessment - 09/26/19 0951    Subjective  Patient reports no new issues. Patient says she is continuing to make slow steady progress with RT ankle mobility.    Pertinent History  unremarkable    Limitations  Sitting;Lifting;Standing;Walking;House hold activities    How long can you sit comfortably?  30    How long can you stand comfortably?  15 minutes putting most wt on LT but some on RT    How long  can you walk comfortably?  PT using crutches with PWB without boot for five minutes.    Patient Stated Goals  drive, walk without crutches or boot, bike    Currently in Pain?  Yes    Pain Score  4     Pain Location  Ankle    Pain Orientation  Right;Anterior    Pain Descriptors / Indicators  Sore    Pain Onset  1 to 4 weeks ago                       [facility] Adult PT Treatment/Exercise - 09/26/19 0001      Manual Therapy   Manual Therapy  Joint mobilization;Soft tissue mobilization;Passive ROM    Manual therapy comments  done seperate from all other aspects of treatment.     Edema Management  retro massage to RT ankle/ calf decrease swelling     Joint Mobilization  Grade II-III ankle DF MWM RT foot on 12 inch box x10    Passive ROM  prone DF PROM      Ankle Exercises: Stretches   Plantar Fascia Stretch  3 reps;30 seconds   RT foot   Slant Board Stretch  3 reps;30 seconds  Ankle Exercises: Standing   Heel Raises  15 reps    Other Standing Ankle Exercises  Sit to stand from chair with faom x10; sit to stand from chair x10      Ankle Exercises: Seated   ABC's  1 rep   Capital letters, RT side   Ankle Circles/Pumps  Right;20 reps   CW/CCW   Other Seated Ankle Exercises  Ankle band 4 way with red band, 20 x each, RT ankle             PT Education - 09/26/19 [phone]    Education Details  Patient educated on addition of sit to stands from elevated seat to improve squatting form, improve comfort with bending RT knee during squats    Person(s) Educated  Patient    Methods  Explanation    Comprehension  Verbalized understanding;Returned demonstration       PT Short Term Goals - 09/18/19 1632      PT SHORT TERM GOAL #1   Title  PT ROM in Rt LE is wnl to allow normal gait    Time  4    Period  Weeks    Status  Partially Met   all but DF   Target Date  09/12/19      PT SHORT TERM GOAL #2   Title  Pt to be ambulating without  assistive device and without  walking boot    Time  4    Period  Weeks    Status  Partially Met   10/13: crutches but no boot.     PT SHORT TERM GOAL #3   Title  PT pain to be no greater than a 5/10 to allow pt to walk for up to 40 minutes at a time to be able to complete shopping/ housework activity.    Time  4    Period  Weeks    Status  Achieved        PT Long Term Goals - 09/08/19 1223      PT LONG TERM GOAL #1   Title  Pt strength of LE mm to be at least 4+/5 to allow pt to be able to go up and down 2 flights of steps with one hand rail assist without increased pain.    Time  8    Period  Weeks    Status  On-going      PT LONG TERM GOAL #2   Title  PT pain to be no greater than a 2/10 to allow pt to ambulate/ stand for 2 hours at a time to be ready for RTW duties.    Time  8    Period  Weeks    Status  On-going      PT LONG TERM GOAL #3   Title  PT to be able to single leg stance on both LE for at least 45 seconds to allow pt to be confident walking  up/down hills on uneven terrain.    Time  8    Period  Weeks    Status  On-going            Plan - 09/26/19 0954    Clinical Impression Statement  Patient tolerated session well today. Added ankle band 4 way for improveing RT ankle strength through improving ROM. Patient noted reduced ankle stiffness with added manual MWM ankle dorsiflexion in 12 inch box. Patient demos apprehension with bending RT knee during attempted functional squats. Activity was modified to sit  to stand with raised surface (foam pad). Patient showed improved form, then foam pad was removed. Patient then instructed to perform chair taps standard height. Patient tolerated this well.    Examination-Activity Limitations  Bathing;Bed Mobility;Caring for Others;Carry;Dressing;Lift;Locomotion Level;Sit;Sleep;Squat;Stairs;Stand;Toileting;Transfers    Examination-Participation Restrictions  Church;Cleaning;Community Activity;Driving;Interpersonal Relationship;Laundry;Meal Prep;Shop;Yard  Work    Stability/Clinical Decision Making  Stable/Uncomplicated    Rehab Potential  Good    PT Frequency  3x / week    PT Duration  8 weeks    PT Treatment/Interventions  ADLs/Self Care Home Management;Cryotherapy;Electrical Stimulation;DME Instruction;Gait training;Stair training;Functional mobility training;Therapeutic activities;Therapeutic exercise;Balance training;Patient/family education;Manual techniques;Manual lymph drainage;Passive range of motion;Scar mobilization;Dry needling    PT Next Visit Plan  Continue to progress RT ankle DF and RLE strength in functional/ weightbearing position. Progress squatting form as tolerated.    PT Home Exercise Plan  ROM, Circles, and isometrics for dorsiflexion/plantarflexion, ABC, towel crunches, sidelying in/eversion, prone DF/PF>       Patient will benefit from skilled therapeutic intervention in order to improve the following deficits and impairments:  Abnormal gait, Decreased activity tolerance, Decreased balance, Decreased endurance, Decreased knowledge of use of DME, Decreased mobility, Decreased range of motion, Decreased strength, Difficulty walking, Increased edema, Increased fascial restricitons, Pain  Visit Diagnosis: Stiffness of right ankle, not elsewhere classified  Muscle weakness (generalized)  Other abnormalities of gait and mobility  Pain in right ankle and joints of right foot     Problem List Patient Active Problem List   Diagnosis Date Noted  . Closed right trimalleolar fracture, initial encounter 07/17/2019    Josue Hector DPT 09/26/2019, 10:05 AM  [facility] [address] [place], [place], [address] Phone: [phone]   Fax:  [phone]  Name: Kanai Hilger MRN: [id number] Date of Birth: 05-Feb-1966

## 2019-09-29 ENCOUNTER — Encounter (HOSPITAL_COMMUNITY): Payer: Self-pay | Admitting: Physical Therapy

## 2019-09-29 ENCOUNTER — Ambulatory Visit (HOSPITAL_COMMUNITY): Payer: 59 | Admitting: Physical Therapy

## 2019-09-29 ENCOUNTER — Other Ambulatory Visit: Payer: Self-pay

## 2019-09-29 DIAGNOSIS — M6281 Muscle weakness (generalized): Secondary | ICD-10-CM | POA: Diagnosis not present

## 2019-09-29 DIAGNOSIS — M25671 Stiffness of right ankle, not elsewhere classified: Secondary | ICD-10-CM

## 2019-09-29 DIAGNOSIS — R2689 Other abnormalities of gait and mobility: Secondary | ICD-10-CM | POA: Diagnosis not present

## 2019-09-29 DIAGNOSIS — M25571 Pain in right ankle and joints of right foot: Secondary | ICD-10-CM | POA: Diagnosis not present

## 2019-09-29 NOTE — Therapy (Signed)
[facility] [address] [place], [place], [address] Phone: [phone]   Fax:  [phone]  Physical Therapy Treatment  Patient Details  Name: Laura [name] MRN: [id number] Date of Birth: March 14, 1966 Referring Provider (PT): Victorino December   Encounter Date: 09/29/2019  PT End of Session - 09/29/19 1444    Visit Number  20    Number of Visits  24    Date for PT Re-Evaluation  10/14/19    Authorization Type  UMR; prog note needed at visit 28 cert needed 65/4    PT Start Time  1050    PT Stop Time  1135    PT Time Calculation (min)  45 min    Activity Tolerance  Patient tolerated treatment well   c/o stiffness through session   Behavior During Therapy  [facility] for tasks assessed/performed       Past Medical History:  Diagnosis Date  . Ankle fracture    Right  . ASCUS favor benign 2004   colposcopy negative  . Vertigo    on occasion     Past Surgical History:  Procedure Laterality Date  . ABDOMINAL HYSTERECTOMY  01/2010   Leiomyomata  . CESAREAN SECTION  1991   x1  . COLONOSCOPY N/A 09/03/2014   Procedure: COLONOSCOPY;  Surgeon: Rogene Houston, MD;  [facility];  Service: Endoscopy;  Laterality: N/A;  830  . COLPOSCOPY    . ORIF ANKLE FRACTURE Right 07/17/2019   Procedure: OPEN REDUCTION INTERNAL FIXATION (ORIF) ANKLE FRACTURE;  Surgeon: Nicholes Stairs, MD;  Location: [facility];  Service: Orthopedics;  Laterality: Right;  90 mins  . TUBAL LIGATION      There were no vitals filed for this visit.  Subjective Assessment - 09/29/19 1443    Subjective  PT states that the exercises are hard due to the tightness but she has been working on it all weekend.  PT goes to MD tomorrow    Pertinent History  unremarkable    Limitations  Sitting;Lifting;Standing;Walking;House hold activities    How long can you sit comfortably?  30    How long can you stand comfortably?  15 minutes putting most wt on LT but some on RT     How long can you walk comfortably?  PT using crutches with PWB without boot for five minutes.    Patient Stated Goals  drive, walk without crutches or boot, bike    Pain Onset  More than a month ago                       [facility] Adult PT Treatment/Exercise - 09/29/19 0001      Ankle Exercises: Stretches   Plantar Fascia Stretch  3 reps;30 seconds   RT foot   Slant Board Stretch  3 reps;30 seconds      Ankle Exercises: Standing   Tai Chi  plank to downward dog x 5     Other Standing Ankle Exercises  split stance squat with person holding heel down x 10; squat picking up item off 4" step x 5    5# on Rt UE      Ankle Exercises: Aerobic   Other Aerobic  total gym for plantar stretch x 2'             PT Education - 09/29/19 1443    Education Details  the importance of keeping body in proper alignment while completing exercises.  Person(s) Educated  Patient    Methods  Explanation;Tactile cues;Verbal cues    Comprehension  Verbalized understanding;Need further instruction       PT Short Term Goals - 09/18/19 1632      PT SHORT TERM GOAL #1   Title  PT ROM in Rt LE is wnl to allow normal gait    Time  4    Period  Weeks    Status  Partially Met   all but DF   Target Date  09/12/19      PT SHORT TERM GOAL #2   Title  Pt to be ambulating without  assistive device and without walking boot    Time  4    Period  Weeks    Status  Partially Met   10/13: crutches but no boot.     PT SHORT TERM GOAL #3   Title  PT pain to be no greater than a 5/10 to allow pt to walk for up to 40 minutes at a time to be able to complete shopping/ housework activity.    Time  4    Period  Weeks    Status  Achieved        PT Long Term Goals - 09/08/19 1223      PT LONG TERM GOAL #1   Title  Pt strength of LE mm to be at least 4+/5 to allow pt to be able to go up and down 2 flights of steps with one hand rail assist without increased pain.    Time  8    Period   Weeks    Status  On-going      PT LONG TERM GOAL #2   Title  PT pain to be no greater than a 2/10 to allow pt to ambulate/ stand for 2 hours at a time to be ready for RTW duties.    Time  8    Period  Weeks    Status  On-going      PT LONG TERM GOAL #3   Title  PT to be able to single leg stance on both LE for at least 45 seconds to allow pt to be confident walking  up/down hills on uneven terrain.    Time  8    Period  Weeks    Status  On-going            Plan - 09/29/19 1444    Clinical Impression Statement  Added split stance squat as well as downward dog and stretching gastroc complex on the total gym.  PT fatigued with today's treatment. Pt had to have moderat physical cues to keep ankle down and trunk in proper alignment during exercises.    Examination-Activity Limitations  Bathing;Bed Mobility;Caring for Others;Carry;Dressing;Lift;Locomotion Level;Sit;Sleep;Squat;Stairs;Stand;Toileting;Transfers    Examination-Participation Restrictions  Church;Cleaning;Community Activity;Driving;Interpersonal Relationship;Laundry;Meal Prep;Shop;Yard Work    Stability/Clinical Decision Making  Stable/Uncomplicated    Rehab Potential  Good    PT Frequency  3x / week    PT Duration  8 weeks    PT Treatment/Interventions  ADLs/Self Care Home Management;Cryotherapy;Electrical Stimulation;DME Instruction;Gait training;Stair training;Functional mobility training;Therapeutic activities;Therapeutic exercise;Balance training;Patient/family education;Manual techniques;Manual lymph drainage;Passive range of motion;Scar mobilization;Dry needling    PT Next Visit Plan  See if MD signed order for JAS, if so send off for JAS.    PT Home Exercise Plan  ROM, Circles, and isometrics for dorsiflexion/plantarflexion, ABC, towel crunches, sidelying in/eversion, prone DF/PF>       Patient will benefit from skilled  therapeutic intervention in order to improve the following deficits and impairments:  Abnormal  gait, Decreased activity tolerance, Decreased balance, Decreased endurance, Decreased knowledge of use of DME, Decreased mobility, Decreased range of motion, Decreased strength, Difficulty walking, Increased edema, Increased fascial restricitons, Pain  Visit Diagnosis: Stiffness of right ankle, not elsewhere classified  Muscle weakness (generalized)  Other abnormalities of gait and mobility  Pain in right ankle and joints of right foot     Problem List Patient Active Problem List   Diagnosis Date Noted  . Closed right trimalleolar fracture, initial encounter 07/17/2019    Rayetta Humphrey, PT CLT [phone] 09/29/2019, 2:47 PM  [facility] [place], [place], [address] Phone: [phone]   Fax:  [phone]  Name: Laura [name] MRN: [id number] Date of Birth: Mar 23, 1966

## 2019-09-30 ENCOUNTER — Encounter (HOSPITAL_COMMUNITY): Payer: 59

## 2019-09-30 DIAGNOSIS — Z4789 Encounter for other orthopedic aftercare: Secondary | ICD-10-CM | POA: Diagnosis not present

## 2019-10-02 ENCOUNTER — Encounter (HOSPITAL_COMMUNITY): Payer: Self-pay | Admitting: Physical Therapy

## 2019-10-02 ENCOUNTER — Ambulatory Visit (HOSPITAL_COMMUNITY): Payer: 59 | Admitting: Physical Therapy

## 2019-10-02 ENCOUNTER — Other Ambulatory Visit: Payer: Self-pay

## 2019-10-02 DIAGNOSIS — M6281 Muscle weakness (generalized): Secondary | ICD-10-CM

## 2019-10-02 DIAGNOSIS — M25571 Pain in right ankle and joints of right foot: Secondary | ICD-10-CM | POA: Diagnosis not present

## 2019-10-02 DIAGNOSIS — M25671 Stiffness of right ankle, not elsewhere classified: Secondary | ICD-10-CM | POA: Diagnosis not present

## 2019-10-02 DIAGNOSIS — R2689 Other abnormalities of gait and mobility: Secondary | ICD-10-CM

## 2019-10-02 NOTE — Therapy (Signed)
[facility] [address] [place], [place], [address] Phone: [phone]   Fax:  [phone]  Physical Therapy Treatment  Patient Details  Name: Laura [name] MRN: [id number] Date of Birth: May 19, 1966 Referring Provider (PT): Victorino December   Encounter Date: 10/02/2019  PT End of Session - 10/02/19 1123    Visit Number  21    Number of Visits  24    Date for PT Re-Evaluation  10/14/19    Authorization Type  UMR; prog note needed at visit 28 cert needed 09/4    PT Start Time  1050    PT Stop Time  1130    PT Time Calculation (min)  40 min    Activity Tolerance  Patient tolerated treatment well   c/o stiffness through session   Behavior During Therapy  [facility] for tasks assessed/performed       Past Medical History:  Diagnosis Date  . Ankle fracture    Right  . ASCUS favor benign 2004   colposcopy negative  . Vertigo    on occasion     Past Surgical History:  Procedure Laterality Date  . ABDOMINAL HYSTERECTOMY  01/2010   Leiomyomata  . CESAREAN SECTION  1991   x1  . COLONOSCOPY N/A 09/03/2014   Procedure: COLONOSCOPY;  Surgeon: Rogene Houston, MD;  [facility];  Service: Endoscopy;  Laterality: N/A;  830  . COLPOSCOPY    . ORIF ANKLE FRACTURE Right 07/17/2019   Procedure: OPEN REDUCTION INTERNAL FIXATION (ORIF) ANKLE FRACTURE;  Surgeon: Nicholes Stairs, MD;  Location: [facility];  Service: Orthopedics;  Laterality: Right;  90 mins  . TUBAL LIGATION      There were no vitals filed for this visit.  Subjective Assessment - 10/02/19 1056    Subjective  PT states that MD took xrays and her ankle has healed  well; states that she is feeling more stiffness than pain.    Pertinent History  unremarkable    Limitations  Sitting;Lifting;Standing;Walking;House hold activities    How long can you sit comfortably?  30    How long can you stand comfortably?  15 minutes putting most wt on LT but some on RT    How long  can you walk comfortably?  PT using crutches with PWB without boot for five minutes.    Patient Stated Goals  drive, walk without crutches or boot, bike    Currently in Pain?  Yes    Pain Score  5     Pain Location  Ankle    Pain Orientation  Right    Pain Descriptors / Indicators  Throbbing    Pain Type  Acute pain    Pain Onset  More than a month ago                       [facility] Adult PT Treatment/Exercise - 10/02/19 0001      Ambulation/Gait   Ambulation/Gait  Yes    Ambulation/Gait Assistance  6: Modified independent (Device/Increase time)    Ambulation/Gait Assistance Details  one crutch    Ambulation Distance (Feet)  150 Feet    Gait Pattern  Step-through pattern    Gait Comments  walking in // without assistive device x 2       Exercises   Exercises  Ankle      Ankle Exercises: Stretches   Soleus Stretch  2 reps;60 seconds    Slant Board Stretch  2 reps;60 seconds    Other Stretch  Knee drivers; 3 sets 30 seconds      Ankle Exercises: Aerobic   Other Aerobic  total gym for plantar stretch x 2'      Ankle Exercises: Standing   BAPS  Level 3;Standing    BAPS Limitations  5    Heel Raises  10 reps    Other Standing Ankle Exercises  squat 10                PT Short Term Goals - 09/18/19 1632      PT SHORT TERM GOAL #1   Title  PT ROM in Rt LE is wnl to allow normal gait    Time  4    Period  Weeks    Status  Partially Met   all but DF   Target Date  09/12/19      PT SHORT TERM GOAL #2   Title  Pt to be ambulating without  assistive device and without walking boot    Time  4    Period  Weeks    Status  Partially Met   10/13: crutches but no boot.     PT SHORT TERM GOAL #3   Title  PT pain to be no greater than a 5/10 to allow pt to walk for up to 40 minutes at a time to be able to complete shopping/ housework activity.    Time  4    Period  Weeks    Status  Achieved        PT Long Term Goals - 09/08/19 1223      PT LONG TERM  GOAL #1   Title  Pt strength of LE mm to be at least 4+/5 to allow pt to be able to go up and down 2 flights of steps with one hand rail assist without increased pain.    Time  8    Period  Weeks    Status  On-going      PT LONG TERM GOAL #2   Title  PT pain to be no greater than a 2/10 to allow pt to ambulate/ stand for 2 hours at a time to be ready for RTW duties.    Time  8    Period  Weeks    Status  On-going      PT LONG TERM GOAL #3   Title  PT to be able to single leg stance on both LE for at least 45 seconds to allow pt to be confident walking  up/down hills on uneven terrain.    Time  8    Period  Weeks    Status  On-going            Plan - 10/02/19 1128    Clinical Impression Statement  PT returns from MD, ankle is healed, and she has been okayed to drive.  Pt information sent to [facility] for ankle brace to improve ROM, Began ambulating in dept with one crutch in // with no assistive device.  PT    Examination-Activity Limitations  Bathing;Bed Mobility;Caring for Others;Carry;Dressing;Lift;Locomotion Level;Sit;Sleep;Squat;Stairs;Stand;Toileting;Transfers    Examination-Participation Restrictions  Church;Cleaning;Community Activity;Driving;Interpersonal Relationship;Laundry;Meal Prep;Shop;Yard Work    Stability/Clinical Decision Making  Stable/Uncomplicated    Rehab Potential  Good    PT Frequency  3x / week    PT Duration  8 weeks    PT Treatment/Interventions  ADLs/Self Care Home Management;Cryotherapy;Electrical Stimulation;DME Instruction;Gait training;Stair training;Functional mobility training;Therapeutic activities;Therapeutic exercise;Balance training;Patient/family  education;Manual techniques;Manual lymph drainage;Passive range of motion;Scar mobilization;Dry needling    PT Next Visit Plan  Continue to work on gt with least assistive device and improving DF.    PT Home Exercise Plan  ROM, Circles, and isometrics for dorsiflexion/plantarflexion, ABC, towel crunches,  sidelying in/eversion, prone DF/PF>       Patient will benefit from skilled therapeutic intervention in order to improve the following deficits and impairments:  Abnormal gait, Decreased activity tolerance, Decreased balance, Decreased endurance, Decreased knowledge of use of DME, Decreased mobility, Decreased range of motion, Decreased strength, Difficulty walking, Increased edema, Increased fascial restricitons, Pain  Visit Diagnosis: Stiffness of right ankle, not elsewhere classified  Muscle weakness (generalized)  Other abnormalities of gait and mobility  Pain in right ankle and joints of right foot     Problem List Patient Active Problem List   Diagnosis Date Noted  . Closed right trimalleolar fracture, initial encounter 07/17/2019   Rayetta Humphrey, PT CLT [phone] 10/02/2019, 3:57 PM  [facility] [place], [place], [address] Phone: [phone]   Fax:  [phone]  Name: Laura [name] MRN: [id number] Date of Birth: 1966-06-09

## 2019-10-02 NOTE — Patient Instructions (Addendum)
Soleus Stretch    Stand with left foot back, both knees bent. Keeping heel on floor, turned slightly out, lean into wall until stretch is felt in lower calf. Hold _30___ seconds. Repeat __3__ times per set. Do _12___ sets per session. Do ____ sessions per day.  http://orth.exer.us/24   Copyright  VHI. All rights reserved.  Dorsiflexion: Self-Mobilization (Crouching)    With left foot pointed behind, lean forward over knee until gentle stretch is felt. Hold _30___ seconds. Relax. Repeat __3__ times per set. Do _1___ sets per session. Do ___2_ sessions per day.  http://orth.exer.us/84   Copyright  VHI. All rights reserved.

## 2019-10-03 ENCOUNTER — Encounter (HOSPITAL_COMMUNITY): Payer: Self-pay | Admitting: Physical Therapy

## 2019-10-03 ENCOUNTER — Ambulatory Visit (HOSPITAL_COMMUNITY): Payer: 59 | Admitting: Physical Therapy

## 2019-10-03 DIAGNOSIS — M25671 Stiffness of right ankle, not elsewhere classified: Secondary | ICD-10-CM | POA: Diagnosis not present

## 2019-10-03 DIAGNOSIS — M25571 Pain in right ankle and joints of right foot: Secondary | ICD-10-CM

## 2019-10-03 DIAGNOSIS — R2689 Other abnormalities of gait and mobility: Secondary | ICD-10-CM

## 2019-10-03 DIAGNOSIS — M6281 Muscle weakness (generalized): Secondary | ICD-10-CM | POA: Diagnosis not present

## 2019-10-03 NOTE — Therapy (Signed)
[facility] [address] [place], [place], [address] Phone: [phone]   Fax:  [phone]  Physical Therapy Treatment  Patient Details  Name: Laura [name] MRN: [id number] Date of Birth: February 11, 1966 Referring Provider (PT): Victorino December   Encounter Date: 10/03/2019  PT End of Session - 10/03/19 1333    Visit Number  22    Number of Visits  24    Date for PT Re-Evaluation  10/14/19    Authorization Type  UMR; prog note needed at visit 28 cert needed 17/4    PT Start Time  1145    PT Stop Time  1225    PT Time Calculation (min)  40 min    Activity Tolerance  Patient tolerated treatment well   c/o stiffness through session   Behavior During Therapy  [facility] for tasks assessed/performed       Past Medical History:  Diagnosis Date  . Ankle fracture    Right  . ASCUS favor benign 2004   colposcopy negative  . Vertigo    on occasion     Past Surgical History:  Procedure Laterality Date  . ABDOMINAL HYSTERECTOMY  01/2010   Leiomyomata  . CESAREAN SECTION  1991   x1  . COLONOSCOPY N/A 09/03/2014   Procedure: COLONOSCOPY;  Surgeon: Rogene Houston, MD;  [facility];  Service: Endoscopy;  Laterality: N/A;  830  . COLPOSCOPY    . ORIF ANKLE FRACTURE Right 07/17/2019   Procedure: OPEN REDUCTION INTERNAL FIXATION (ORIF) ANKLE FRACTURE;  Surgeon: Nicholes Stairs, MD;  Location: [facility];  Service: Orthopedics;  Laterality: Right;  90 mins  . TUBAL LIGATION      There were no vitals filed for this visit.  Subjective Assessment - 10/03/19 1202    Subjective  Pt states that she has been walking with one crutch.  She is doing a lot of stretches at home.    Pertinent History  unremarkable    Limitations  Sitting;Lifting;Standing;Walking;House hold activities    How long can you sit comfortably?  30    How long can you stand comfortably?  15 minutes putting most wt on LT but some on RT    How long can you walk  comfortably?  PT using crutches with PWB without boot for five minutes.    Patient Stated Goals  drive, walk without crutches or boot, bike    Currently in Pain?  Yes    Pain Score  4     Pain Location  Ankle    Pain Orientation  Right;Posterior    Pain Descriptors / Indicators  Aching    Pain Type  Acute pain    Pain Onset  More than a month ago    Pain Frequency  Intermittent    Aggravating Factors   walking    Pain Relieving Factors  rest         [facility] PT Assessment - 10/03/19 0001      AROM   Right Ankle Dorsiflexion  -8    Right Ankle Plantar Flexion  55                   [facility] Adult PT Treatment/Exercise - 10/03/19 0001      Ambulation/Gait   Ambulation/Gait  Yes    Ambulation/Gait Assistance  6: Modified independent (Device/Increase time)    Ambulation Distance (Feet)  200 Feet    Assistive device  --   one crutch with  10# wt in right hand    Gait Pattern  Step-through pattern    Gait Comments  walking in // without assistive device x 2       Exercises   Exercises  Ankle      Manual Therapy   Manual Therapy  Joint mobilization;Soft tissue mobilization;Passive ROM    Manual therapy comments  done seperate from all other aspects of treatment.     Joint Mobilization  Grade II-III ankle DF MWM RT foot on 12 inch box x10    Soft tissue mobilization  Prone gastroc/soleus complex    Passive ROM  prone DF PROM    Muscle Energy Technique  contract relax both supine and prone to improve dorsiflexionl      Ankle Exercises: Stretches   Soleus Stretch  --    Slant Board Stretch  2 reps;60 seconds    Other Stretch  --      Ankle Exercises: Aerobic   Other Aerobic  --      Ankle Exercises: Standing   BAPS  Level 3;Standing    BAPS Limitations  5    Heel Raises  --    Other Standing Ankle Exercises  --      Ankle Exercises: Seated   Other Seated Ankle Exercises  1/2 kneel pushing forward to increase DF       Ankle Exercises: Supine   Other Supine  Ankle Exercises  contract relax exercises at end range  to increase ankle      Prone terminal knee extension x 10/ Dorsiflexion/plantarflexion x 10; contract relax to increase Dorsiflexion.           PT Short Term Goals - 09/18/19 1632      PT SHORT TERM GOAL #1   Title  PT ROM in Rt LE is wnl to allow normal gait    Time  4    Period  Weeks    Status  Partially Met   all but DF   Target Date  09/12/19      PT SHORT TERM GOAL #2   Title  Pt to be ambulating without  assistive device and without walking boot    Time  4    Period  Weeks    Status  Partially Met   10/13: crutches but no boot.     PT SHORT TERM GOAL #3   Title  PT pain to be no greater than a 5/10 to allow pt to walk for up to 40 minutes at a time to be able to complete shopping/ housework activity.    Time  4    Period  Weeks    Status  Achieved        PT Long Term Goals - 09/08/19 1223      PT LONG TERM GOAL #1   Title  Pt strength of LE mm to be at least 4+/5 to allow pt to be able to go up and down 2 flights of steps with one hand rail assist without increased pain.    Time  8    Period  Weeks    Status  On-going      PT LONG TERM GOAL #2   Title  PT pain to be no greater than a 2/10 to allow pt to ambulate/ stand for 2 hours at a time to be ready for RTW duties.    Time  8    Period  Weeks    Status  On-going  PT LONG TERM GOAL #3   Title  PT to be able to single leg stance on both LE for at least 45 seconds to allow pt to be confident walking  up/down hills on uneven terrain.    Time  8    Period  Weeks    Status  On-going            Plan - 10/03/19 1334    Clinical Impression Statement  PT has not heard from [facility] yet.  Pt continues to walk with significant decreased velocity and limits wt on RT LE.  Therapist had pt walk with one crutch with 10# in Rt UE to attempt to improve wt shifting to the right, pt using verbal cues for pt to coincide heelstrike for improved velocity of  gt.  Pt continues to be limited significantly in DF and has decreased DF strength at end range therefore completed isometric strengthening at end range.    Examination-Activity Limitations  Bathing;Bed Mobility;Caring for Others;Carry;Dressing;Lift;Locomotion Level;Sit;Sleep;Squat;Stairs;Stand;Toileting;Transfers    Examination-Participation Restrictions  Church;Cleaning;Community Activity;Driving;Interpersonal Relationship;Laundry;Meal Prep;Shop;Yard Work    Stability/Clinical Decision Making  Stable/Uncomplicated    Rehab Potential  Good    PT Frequency  3x / week    PT Duration  8 weeks    PT Treatment/Interventions  ADLs/Self Care Home Management;Cryotherapy;Electrical Stimulation;DME Instruction;Gait training;Stair training;Functional mobility training;Therapeutic activities;Therapeutic exercise;Balance training;Patient/family education;Manual techniques;Manual lymph drainage;Passive range of motion;Scar mobilization;Dry needling    PT Next Visit Plan  Continue to work on gt with least assistive device and improve active dorsiflexion.    PT Home Exercise Plan  ROM, Circles, and isometrics for dorsiflexion/plantarflexion, ABC, towel crunches, sidelying in/eversion, prone DF/PF>       Patient will benefit from skilled therapeutic intervention in order to improve the following deficits and impairments:  Abnormal gait, Decreased activity tolerance, Decreased balance, Decreased endurance, Decreased knowledge of use of DME, Decreased mobility, Decreased range of motion, Decreased strength, Difficulty walking, Increased edema, Increased fascial restricitons, Pain  Visit Diagnosis: Stiffness of right ankle, not elsewhere classified  Muscle weakness (generalized)  Other abnormalities of gait and mobility  Pain in right ankle and joints of right foot     Problem List Patient Active Problem List   Diagnosis Date Noted  . Closed right trimalleolar fracture, initial encounter 07/17/2019    Rayetta Humphrey, PT CLT [phone] 10/03/2019, 1:41 PM  [facility] [address] [place], [place], [address] Phone: [phone]   Fax:  [phone]  Name: Laura [name] MRN: [id number] Date of Birth: 30-Jun-1966

## 2019-10-06 ENCOUNTER — Ambulatory Visit (HOSPITAL_COMMUNITY): Payer: 59 | Attending: Orthopedic Surgery | Admitting: Physical Therapy

## 2019-10-06 ENCOUNTER — Other Ambulatory Visit: Payer: Self-pay

## 2019-10-06 ENCOUNTER — Encounter (HOSPITAL_COMMUNITY): Payer: Self-pay | Admitting: Physical Therapy

## 2019-10-06 DIAGNOSIS — M25671 Stiffness of right ankle, not elsewhere classified: Secondary | ICD-10-CM | POA: Insufficient documentation

## 2019-10-06 DIAGNOSIS — M25571 Pain in right ankle and joints of right foot: Secondary | ICD-10-CM | POA: Insufficient documentation

## 2019-10-06 DIAGNOSIS — M6281 Muscle weakness (generalized): Secondary | ICD-10-CM | POA: Diagnosis not present

## 2019-10-06 DIAGNOSIS — R2689 Other abnormalities of gait and mobility: Secondary | ICD-10-CM | POA: Diagnosis not present

## 2019-10-06 NOTE — Therapy (Signed)
[facility] [address] [place], [place], [address] Phone: [phone]   Fax:  [phone]  Physical Therapy Treatment  Patient Details  Name: Laura [name] MRN: [id number] Date of Birth: 11-02-1966 Referring Provider (PT): Victorino December   Encounter Date: 10/06/2019  PT End of Session - 10/06/19 1052    Visit Number  23    Number of Visits  24    Date for PT Re-Evaluation  10/14/19    Authorization Type  UMR; prog note needed at visit 28 cert needed 42/8    Authorization - Visit Number  --    PT Start Time  7681   patient late to session   PT Stop Time  1130    PT Time Calculation (min)  38 min    Activity Tolerance  Patient tolerated treatment well   c/o stiffness through session   Behavior During Therapy  [facility] for tasks assessed/performed       Past Medical History:  Diagnosis Date  . Ankle fracture    Right  . ASCUS favor benign 2004   colposcopy negative  . Vertigo    on occasion     Past Surgical History:  Procedure Laterality Date  . ABDOMINAL HYSTERECTOMY  01/2010   Leiomyomata  . CESAREAN SECTION  1991   x1  . COLONOSCOPY N/A 09/03/2014   Procedure: COLONOSCOPY;  Surgeon: Rogene Houston, MD;  [facility];  Service: Endoscopy;  Laterality: N/A;  830  . COLPOSCOPY    . ORIF ANKLE FRACTURE Right 07/17/2019   Procedure: OPEN REDUCTION INTERNAL FIXATION (ORIF) ANKLE FRACTURE;  Surgeon: Nicholes Stairs, MD;  Location: [facility];  Service: Orthopedics;  Laterality: Right;  90 mins  . TUBAL LIGATION      There were no vitals filed for this visit.  Subjective Assessment - 10/06/19 1056    Subjective  Reports she has not heard back about the brace. States she has been doing her exercises. Reports she was sore after last session. States her pain/soreness is primarily in her heel.    Pertinent History  unremarkable    Limitations  Sitting;Lifting;Standing;Walking;House hold activities    How long can  you sit comfortably?  30    How long can you stand comfortably?  15 minutes putting most wt on LT but some on RT    How long can you walk comfortably?  PT using crutches with PWB without boot for five minutes.    Patient Stated Goals  drive, walk without crutches or boot, bike    Currently in Pain?  Yes    Pain Score  5     Pain Location  Ankle    Pain Orientation  Right;Posterior;Lower    Pain Descriptors / Indicators  Aching    Pain Onset  More than a month ago                       [facility] Adult PT Treatment/Exercise - 10/06/19 0001      Ambulation/Gait   Ambulation/Gait  Yes    Ambulation/Gait Assistance  6: Modified independent (Device/Increase time)    Ambulation/Gait Assistance Details  one crutch    Ambulation Distance (Feet)  150 Feet   x4 for 60 feet in addition to 150 ft   Gait Pattern  Step-through pattern;Decreased dorsiflexion - right;Decreased stride length;Decreased stance time - right;Decreased weight shift to right      Manual Therapy   Manual  Therapy  Joint mobilization    Manual therapy comments  done seperate from all other aspects of treatment.     Joint Mobilization  Grade II-III ankle DF MWM RT foot in kneeling position, seated TCJ mobilization R - grade II/III       Ankle Exercises: Supine   Other Supine Ankle Exercises  Total gym - pos 20- 4x6, PT overpressure at heel for stretch.       Ankle Exercises: Sidelying   Other Sidelying Ankle Exercises  self massage/mobilization to ball/heel/arch of R foot - tolerated well - 5 minutes       Ankle Exercises: Seated   Other Seated Ankle Exercises  DF with knees at about 60 degrees of flexion 3x5, 5" holds R               PT Short Term Goals - 09/18/19 1632      PT SHORT TERM GOAL #1   Title  PT ROM in Rt LE is wnl to allow normal gait    Time  4    Period  Weeks    Status  Partially Met   all but DF   Target Date  09/12/19      PT SHORT TERM GOAL #2   Title  Pt to be ambulating  without  assistive device and without walking boot    Time  4    Period  Weeks    Status  Partially Met   10/13: crutches but no boot.     PT SHORT TERM GOAL #3   Title  PT pain to be no greater than a 5/10 to allow pt to walk for up to 40 minutes at a time to be able to complete shopping/ housework activity.    Time  4    Period  Weeks    Status  Achieved        PT Long Term Goals - 09/08/19 1223      PT LONG TERM GOAL #1   Title  Pt strength of LE mm to be at least 4+/5 to allow pt to be able to go up and down 2 flights of steps with one hand rail assist without increased pain.    Time  8    Period  Weeks    Status  On-going      PT LONG TERM GOAL #2   Title  PT pain to be no greater than a 2/10 to allow pt to ambulate/ stand for 2 hours at a time to be ready for RTW duties.    Time  8    Period  Weeks    Status  On-going      PT LONG TERM GOAL #3   Title  PT to be able to single leg stance on both LE for at least 45 seconds to allow pt to be confident walking  up/down hills on uneven terrain.    Time  8    Period  Weeks    Status  On-going            Plan - 10/06/19 1108    Clinical Impression Statement  Improved walking mechanics noted after self mobilization to bottom of foot with lacrosse ball. (decreased lumbar flexion in stance phase noted, though this increased/returned with fatigue with ambulation. Focused on ankle mobilization with movement, this was not tolerated well. Primary limitations in ankle ROM and gait mechanics at this time secondary to ankle ROM limitaitons. Focused on walking today but  gait mechanics decreased with fatigue. Used tactile cues which helped initially    Examination-Activity Limitations  Bathing;Bed Mobility;Caring for Others;Carry;Dressing;Lift;Locomotion Level;Sit;Sleep;Squat;Stairs;Stand;Toileting;Transfers    Examination-Participation Restrictions  Church;Cleaning;Community Activity;Driving;Interpersonal Relationship;Laundry;Meal  Prep;Shop;Yard Work    Stability/Clinical Decision Making  Stable/Uncomplicated    Rehab Potential  Good    PT Frequency  3x / week    PT Duration  8 weeks    PT Treatment/Interventions  ADLs/Self Care Home Management;Cryotherapy;Electrical Stimulation;DME Instruction;Gait training;Stair training;Functional mobility training;Therapeutic activities;Therapeutic exercise;Balance training;Patient/family education;Manual techniques;Manual lymph drainage;Passive range of motion;Scar mobilization;Dry needling    PT Next Visit Plan  Continue to work on gt with least assistive device and improve active dorsiflexion.    PT Home Exercise Plan  ROM, Circles, and isometrics for dorsiflexion/plantarflexion, ABC, towel crunches, sidelying in/eversion, prone DF/PF> 11/2 self mobilization with LAX ball to bottom of foot (or soup can)       Patient will benefit from skilled therapeutic intervention in order to improve the following deficits and impairments:  Abnormal gait, Decreased activity tolerance, Decreased balance, Decreased endurance, Decreased knowledge of use of DME, Decreased mobility, Decreased range of motion, Decreased strength, Difficulty walking, Increased edema, Increased fascial restricitons, Pain  Visit Diagnosis: Stiffness of right ankle, not elsewhere classified  Muscle weakness (generalized)  Other abnormalities of gait and mobility  Pain in right ankle and joints of right foot     Problem List Patient Active Problem List   Diagnosis Date Noted  . Closed right trimalleolar fracture, initial encounter 07/17/2019    1:43 PM, 10/06/19 Jerene Pitch, DPT Physical Therapy with [facility]  [phone] office   [facility] [address] [place], [place], [address] Phone: [phone]   Fax:  [phone]  Name: Laura [name] MRN: [id number] Date of Birth: 1966/01/25

## 2019-10-07 ENCOUNTER — Encounter (HOSPITAL_COMMUNITY): Payer: 59

## 2019-10-09 ENCOUNTER — Ambulatory Visit (HOSPITAL_COMMUNITY): Payer: 59 | Admitting: Physical Therapy

## 2019-10-09 ENCOUNTER — Encounter (HOSPITAL_COMMUNITY): Payer: Self-pay | Admitting: Physical Therapy

## 2019-10-09 ENCOUNTER — Other Ambulatory Visit: Payer: Self-pay

## 2019-10-09 ENCOUNTER — Encounter (HOSPITAL_COMMUNITY): Payer: 59 | Admitting: Physical Therapy

## 2019-10-09 DIAGNOSIS — M25671 Stiffness of right ankle, not elsewhere classified: Secondary | ICD-10-CM | POA: Diagnosis not present

## 2019-10-09 DIAGNOSIS — M25571 Pain in right ankle and joints of right foot: Secondary | ICD-10-CM

## 2019-10-09 DIAGNOSIS — R2689 Other abnormalities of gait and mobility: Secondary | ICD-10-CM

## 2019-10-09 DIAGNOSIS — M6281 Muscle weakness (generalized): Secondary | ICD-10-CM | POA: Diagnosis not present

## 2019-10-09 NOTE — Therapy (Signed)
[facility] [address] [place], [place], [address] Phone: [phone]   Fax:  [phone]  Physical Therapy Treatment  Patient Details  Name: Laura [name] MRN: [id number] Date of Birth: 08-01-1966 Referring Provider (PT): Victorino December   Encounter Date: 10/09/2019  PT End of Session - 10/09/19 1641    Visit Number  24    Number of Visits  28    Date for PT Re-Evaluation  10/14/19    Authorization Type  [facility]R; prog note needed at visit 28 cert needed 51/8    PT Start Time  1050    PT Stop Time  1130    PT Time Calculation (min)  40 min    Activity Tolerance  Patient tolerated treatment well   c/o stiffness through session   Behavior During Therapy  [facility] for tasks assessed/performed       Past Medical History:  Diagnosis Date  . Ankle fracture    Right  . ASCUS favor benign 2004   colposcopy negative  . Vertigo    on occasion     Past Surgical History:  Procedure Laterality Date  . ABDOMINAL HYSTERECTOMY  01/2010   Leiomyomata  . CESAREAN SECTION  1991   x1  . COLONOSCOPY N/A 09/03/2014   Procedure: COLONOSCOPY;  Surgeon: Rogene Houston, MD;  [facility];  Service: Endoscopy;  Laterality: N/A;  830  . COLPOSCOPY    . ORIF ANKLE FRACTURE Right 07/17/2019   Procedure: OPEN REDUCTION INTERNAL FIXATION (ORIF) ANKLE FRACTURE;  Surgeon: Nicholes Stairs, MD;  Location: [place] [facility];  Service: Orthopedics;  Laterality: Right;  90 mins  . TUBAL LIGATION      There were no vitals filed for this visit.  Subjective Assessment - 10/09/19 1058    Subjective  [facility] has contacted pt but she needs to call them back.  She is still sitting down for a shower.    Pertinent History  unremarkable    Limitations  Sitting;Lifting;Standing;Walking;House hold activities    How long can you sit comfortably?  30    How long can you stand comfortably?  15 minutes putting most wt on LT but some on RT    How long can you walk  comfortably?  PT using crutches with PWB without boot for five minutes.    Patient Stated Goals  drive, walk without crutches or boot, bike    Currently in Pain?  Yes    Pain Score  5     Pain Location  Ankle    Pain Orientation  Right;Posterior    Pain Descriptors / Indicators  Aching;Throbbing    Pain Type  Chronic pain    Pain Onset  More than a month ago         [facility] PT Assessment - 10/09/19 0001      AROM   Right Ankle Dorsiflexion  -6                   [facility] Adult PT Treatment/Exercise - 10/09/19 0001      Ambulation/Gait   Ambulation/Gait  Yes    Ambulation/Gait Assistance  6: Modified independent (Device/Increase time)    Ambulation/Gait Assistance Details  none    Ambulation Distance (Feet)  150 Feet   x4 for 60 feet in addition to 150 ft   Gait Pattern  Step-through pattern;Decreased dorsiflexion - right;Decreased stride length;Decreased stance time - right;Decreased weight shift to right    Gait Comments  .  8 mph gait master working on equal strides;       Manual Therapy   Manual Therapy  Joint mobilization    Manual therapy comments  done seperate from all other aspects of treatment.     Joint Mobilization  Grade II-III ankle DF MWM RT foot in kneeling position, seated TCJ mobilization R - grade II/III       Ankle Exercises: Seated   Other Seated Ankle Exercises  DF with knees at about 60 degrees of flexion 3x5, 5" holds R      Ankle Exercises: Supine   Other Supine Ankle Exercises  --      Ankle Exercises: Sidelying         Ankle Exercises: Stretches   Soleus Stretch  2 reps;60 seconds    Gastroc Stretch  3 reps;30 seconds    Slant Board Stretch  2 reps;60 seconds    Other Stretch  Knee drivers; 2 sets 30 seconds      Ankle Exercises: Standing                                                                                                 Standing tandem on flat aspect of terminal knee extension          foam roll x 1' B x2           PT Short Term Goals - 09/18/19 1632      PT SHORT TERM GOAL #1   Title  PT ROM in Rt LE is wnl to allow normal gait    Time  4    Period  Weeks    Status  Partially Met   all but DF   Target Date  09/12/19      PT SHORT TERM GOAL #2   Title  Pt to be ambulating without  assistive device and without walking boot    Time  4    Period  Weeks    Status  Partially Met   10/13: crutches but no boot.     PT SHORT TERM GOAL #3   Title  PT pain to be no greater than a 5/10 to allow pt to walk for up to 40 minutes at a time to be able to complete shopping/ housework activity.    Time  4    Period  Weeks    Status  Achieved        PT Long Term Goals - 09/08/19 1223      PT LONG TERM GOAL #1   Title  Pt strength of LE mm to be at least 4+/5 to allow pt to be able to go up and down 2 flights of steps with one hand rail assist without increased pain.    Time  8    Period  Weeks    Status  On-going      PT LONG TERM GOAL #2   Title  PT pain to be no greater than a 2/10 to allow pt to ambulate/ stand for 2 hours at a time to be ready for RTW duties.  Time  8    Period  Weeks    Status  On-going      PT LONG TERM GOAL #3   Title  PT to be able to single leg stance on both LE for at least 45 seconds to allow pt to be confident walking  up/down hills on uneven terrain.    Time  8    Period  Weeks    Status  On-going            Plan - 10/09/19 1641    Clinical Impression Statement  Began using gait trainer to attempt to normalize gt.  Once pt was completing equal stride length had pt ambulate without asssitive device concentrating on keeping core tight and equalizing step length on her own; this will need more practice. ROM slightly improved.  PT will call [facility] to set up a time to recieve brace. .    Examination-Activity Limitations  Bathing;Bed Mobility;Caring for Others;Carry;Dressing;Lift;Locomotion Level;Sit;Sleep;Squat;Stairs;Stand;Toileting;Transfers     Examination-Participation Restrictions  Church;Cleaning;Community Activity;Driving;Interpersonal Relationship;Laundry;Meal Prep;Shop;Yard Work    Stability/Clinical Decision Making  Stable/Uncomplicated    Rehab Potential  Good    PT Frequency  3x / week    PT Duration  8 weeks    PT Treatment/Interventions  ADLs/Self Care Home Management;Cryotherapy;Electrical Stimulation;DME Instruction;Gait training;Stair training;Functional mobility training;Therapeutic activities;Therapeutic exercise;Balance training;Patient/family education;Manual techniques;Manual lymph drainage;Passive range of motion;Scar mobilization;Dry needling    PT Next Visit Plan  Continue to work on gt with least assistive device and improve active dorsiflexion.    PT Home Exercise Plan  ROM, Circles, and isometrics for dorsiflexion/plantarflexion, ABC, towel crunches, sidelying in/eversion, prone DF/PF> 11/2 self mobilization with LAX ball to bottom of foot (or soup can)       Patient will benefit from skilled therapeutic intervention in order to improve the following deficits and impairments:  Abnormal gait, Decreased activity tolerance, Decreased balance, Decreased endurance, Decreased knowledge of use of DME, Decreased mobility, Decreased range of motion, Decreased strength, Difficulty walking, Increased edema, Increased fascial restricitons, Pain  Visit Diagnosis: Stiffness of right ankle, not elsewhere classified  Muscle weakness (generalized)  Other abnormalities of gait and mobility  Pain in right ankle and joints of right foot     Problem List Patient Active Problem List   Diagnosis Date Noted  . Closed right trimalleolar fracture, initial encounter 07/17/2019    Rayetta Humphrey, PT CLT [phone] 10/09/2019, 4:44 PM  [facility] [address] [place], [place], [address] Phone: [phone]   Fax:  [phone]  Name: Laura [name] MRN: [id number] Date  of Birth: 1966-06-16

## 2019-10-10 ENCOUNTER — Encounter (HOSPITAL_COMMUNITY): Payer: Self-pay | Admitting: Physical Therapy

## 2019-10-10 ENCOUNTER — Ambulatory Visit (HOSPITAL_COMMUNITY): Payer: 59 | Admitting: Physical Therapy

## 2019-10-10 ENCOUNTER — Encounter (HOSPITAL_COMMUNITY): Payer: 59

## 2019-10-10 DIAGNOSIS — M25571 Pain in right ankle and joints of right foot: Secondary | ICD-10-CM

## 2019-10-10 DIAGNOSIS — R2689 Other abnormalities of gait and mobility: Secondary | ICD-10-CM | POA: Diagnosis not present

## 2019-10-10 DIAGNOSIS — M25671 Stiffness of right ankle, not elsewhere classified: Secondary | ICD-10-CM | POA: Diagnosis not present

## 2019-10-10 DIAGNOSIS — M6281 Muscle weakness (generalized): Secondary | ICD-10-CM | POA: Diagnosis not present

## 2019-10-10 NOTE — Therapy (Signed)
[facility] [address] [place], [place], [address] Phone: [phone]   Fax:  [phone]  Physical Therapy Treatment  Patient Details  Name: Laura [name] MRN: [id number] Date of Birth: Jun 30, 1966 Referring Provider (PT): Victorino December   Encounter Date: 10/10/2019  PT End of Session - 10/10/19 1007    Visit Number  25    Number of Visits  28    Date for PT Re-Evaluation  10/14/19    Authorization Type  [facility]R; prog note needed at visit 28 cert needed 87/6    PT Start Time  0902    PT Stop Time  0946    PT Time Calculation (min)  44 min    Equipment Utilized During Treatment  Other (comment)   single crutch   Activity Tolerance  Patient tolerated treatment well   c/o stiffness through session   Behavior During Therapy  [facility] for tasks assessed/performed       Past Medical History:  Diagnosis Date  . Ankle fracture    Right  . ASCUS favor benign 2004   colposcopy negative  . Vertigo    on occasion     Past Surgical History:  Procedure Laterality Date  . ABDOMINAL HYSTERECTOMY  01/2010   Leiomyomata  . CESAREAN SECTION  1991   x1  . COLONOSCOPY N/A 09/03/2014   Procedure: COLONOSCOPY;  Surgeon: Rogene Houston, MD;  [facility];  Service: Endoscopy;  Laterality: N/A;  830  . COLPOSCOPY    . ORIF ANKLE FRACTURE Right 07/17/2019   Procedure: OPEN REDUCTION INTERNAL FIXATION (ORIF) ANKLE FRACTURE;  Surgeon: Nicholes Stairs, MD;  Location: [facility];  Service: Orthopedics;  Laterality: Right;  90 mins  . TUBAL LIGATION      There were no vitals filed for this visit.  Subjective Assessment - 10/10/19 0904    Subjective  Patient reports he ankle is very stiff today, more than usual. She is mostly stiff and not very sore. She has been having burning in her heel last night and twitching in her lower leg.    Pertinent History  unremarkable    Limitations  Sitting;Lifting;Standing;Walking;House hold activities    How long can you sit comfortably?  30    How long can you stand comfortably?  15 minutes putting most wt on LT but some on RT    How long can you walk comfortably?  PT using crutches with PWB without boot for five minutes.    Patient Stated Goals  drive, walk without crutches or boot, bike    Currently in Pain?  Yes    Pain Score  6     Pain Location  Ankle    Pain Orientation  Right    Pain Descriptors / Indicators  Throbbing;Tightness    Pain Onset  More than a month ago                       [facility] Adult PT Treatment/Exercise - 10/10/19 0001      Ambulation/Gait   Ambulation/Gait  Yes    Ambulation/Gait Assistance  6: Modified independent (Device/Increase time)    Ambulation Distance (Feet)  100 Feet    Gait Pattern  Step-through pattern;Decreased dorsiflexion - right;Decreased stride length;Decreased stance time - right;Decreased weight shift to right    Gait Comments  verbal cueing for decreased weight shift off RLE, decreased UE support on crutch      Manual Therapy  Manual Therapy  Joint mobilization    Manual therapy comments  done seperate from all other aspects of treatment.     Joint Mobilization  Grade II-III ankle DF MWM RT foot in kneeling position, seated TCJ mobilization R - grade II/III ; Grade II-III TCJ mobilization anterior tibial glide with belt at tibia in kneeling   R Ankle DF ROM -12 initially,improves to -5 following manual     Ankle Exercises: Stretches   Slant Board Stretch  3 reps;30 seconds   verbal cueing for positioning     Ankle Exercises: Standing   Heel Raises  Both;10 reps;Other (comment)   standing on level ground; 1x 10 standing on edge of step            PT Education - 10/10/19 1006    Education Details  patient educated on proper gait mechanics, completing HEP, improving weight bearing on R LE    Person(s) Educated  Patient    Methods  Explanation;Verbal cues    Comprehension  Verbalized understanding       PT  Short Term Goals - 09/18/19 1632      PT SHORT TERM GOAL #1   Title  PT ROM in Rt LE is wnl to allow normal gait    Time  4    Period  Weeks    Status  Partially Met   all but DF   Target Date  09/12/19      PT SHORT TERM GOAL #2   Title  Pt to be ambulating without  assistive device and without walking boot    Time  4    Period  Weeks    Status  Partially Met   10/13: crutches but no boot.     PT SHORT TERM GOAL #3   Title  PT pain to be no greater than a 5/10 to allow pt to walk for up to 40 minutes at a time to be able to complete shopping/ housework activity.    Time  4    Period  Weeks    Status  Achieved        PT Long Term Goals - 09/08/19 1223      PT LONG TERM GOAL #1   Title  Pt strength of LE mm to be at least 4+/5 to allow pt to be able to go up and down 2 flights of steps with one hand rail assist without increased pain.    Time  8    Period  Weeks    Status  On-going      PT LONG TERM GOAL #2   Title  PT pain to be no greater than a 2/10 to allow pt to ambulate/ stand for 2 hours at a time to be ready for RTW duties.    Time  8    Period  Weeks    Status  On-going      PT LONG TERM GOAL #3   Title  PT to be able to single leg stance on both LE for at least 45 seconds to allow pt to be confident walking  up/down hills on uneven terrain.    Time  8    Period  Weeks    Status  On-going            Plan - 10/10/19 1015    Clinical Impression Statement  Patient able to tolerate talocrual joint mobilizations for dorsiflexion with improvement in R ankle ROM following. She requires frequent verbal  cueing for R heel strike and decreased weight shift off RLE during stance. She experiences some ankle pain/stiffness in anterior ankle during increasing DF while performing heel raise exercise on edge of step. Patient has c/o stiffness throughout session as well as intermittent heel pain. Patient will continue to benefit from skilled physical therapy in order to  improve function and reduce impairment.    Examination-Activity Limitations  Bathing;Bed Mobility;Caring for Others;Carry;Dressing;Lift;Locomotion Level;Sit;Sleep;Squat;Stairs;Stand;Toileting;Transfers    Examination-Participation Restrictions  Church;Cleaning;Community Activity;Driving;Interpersonal Relationship;Laundry;Meal Prep;Shop;Yard Work    Stability/Clinical Decision Making  Stable/Uncomplicated    Rehab Potential  Good    PT Frequency  3x / week    PT Duration  8 weeks    PT Treatment/Interventions  ADLs/Self Care Home Management;Cryotherapy;Electrical Stimulation;DME Instruction;Gait training;Stair training;Functional mobility training;Therapeutic activities;Therapeutic exercise;Balance training;Patient/family education;Manual techniques;Manual lymph drainage;Passive range of motion;Scar mobilization;Dry needling    PT Next Visit Plan  Continue to work on gt with least assistive device and improve active dorsiflexion.    PT Home Exercise Plan  ROM, Circles, and isometrics for dorsiflexion/plantarflexion, ABC, towel crunches, sidelying in/eversion, prone DF/PF> 11/2 self mobilization with LAX ball to bottom of foot (or soup can)       Patient will benefit from skilled therapeutic intervention in order to improve the following deficits and impairments:  Abnormal gait, Decreased activity tolerance, Decreased balance, Decreased endurance, Decreased knowledge of use of DME, Decreased mobility, Decreased range of motion, Decreased strength, Difficulty walking, Increased edema, Increased fascial restricitons, Pain  Visit Diagnosis: Stiffness of right ankle, not elsewhere classified  Muscle weakness (generalized)  Other abnormalities of gait and mobility  Pain in right ankle and joints of right foot     Problem List Patient Active Problem List   Diagnosis Date Noted  . Closed right trimalleolar fracture, initial encounter 07/17/2019    10:22 AM, 10/10/19 Mearl Latin PT,  DPT Physical Therapist at [facility] [place], [place], [address] Phone: [phone]   Fax:  [phone]  Name: Yolanda Huffstetler MRN: [id number] Date of Birth: Dec 26, 1965

## 2019-10-13 ENCOUNTER — Encounter (HOSPITAL_COMMUNITY): Payer: Self-pay | Admitting: Physical Therapy

## 2019-10-13 ENCOUNTER — Other Ambulatory Visit: Payer: Self-pay

## 2019-10-13 ENCOUNTER — Ambulatory Visit (HOSPITAL_COMMUNITY): Payer: 59 | Admitting: Physical Therapy

## 2019-10-13 DIAGNOSIS — M25571 Pain in right ankle and joints of right foot: Secondary | ICD-10-CM | POA: Diagnosis not present

## 2019-10-13 DIAGNOSIS — M25671 Stiffness of right ankle, not elsewhere classified: Secondary | ICD-10-CM | POA: Diagnosis not present

## 2019-10-13 DIAGNOSIS — M6281 Muscle weakness (generalized): Secondary | ICD-10-CM

## 2019-10-13 DIAGNOSIS — R2689 Other abnormalities of gait and mobility: Secondary | ICD-10-CM

## 2019-10-13 NOTE — Therapy (Addendum)
[facility] [address] [place], [place], [address] Phone: [phone]   Fax:  [phone]  Physical Therapy Treatment/ Progress note  Patient Details  Name: Matilyn Fehrman MRN: [id number] Date of Birth: 01/07/1966 Referring Provider (PT): Victorino December   Encounter Date: 10/13/2019   Progress Note Reporting Period 09/04/19 to 10/13/19  See note below for Objective Data and Assessment of Progress/Goals.      PT End of Session - 10/13/19 1023    Visit Number  26    Number of Visits  28    Date for PT Re-Evaluation  11/08/19   Mini reassess done 10/13/19   Authorization Type  [facility]R; prog note needed at visit 28 cert needed 13/2    PT Start Time  0905    PT Stop Time  0950    PT Time Calculation (min)  45 min    Equipment Utilized During Treatment  Other (comment)   single crutch   Activity Tolerance  Patient tolerated treatment well   c/o stiffness through session   Behavior During Therapy  [facility] for tasks assessed/performed       Past Medical History:  Diagnosis Date  . Ankle fracture    Right  . ASCUS favor benign 2004   colposcopy negative  . Vertigo    on occasion     Past Surgical History:  Procedure Laterality Date  . ABDOMINAL HYSTERECTOMY  01/2010   Leiomyomata  . CESAREAN SECTION  1991   x1  . COLONOSCOPY N/A 09/03/2014   Procedure: COLONOSCOPY;  Surgeon: Rogene Houston, MD;  [facility];  Service: Endoscopy;  Laterality: N/A;  830  . COLPOSCOPY    . ORIF ANKLE FRACTURE Right 07/17/2019   Procedure: OPEN REDUCTION INTERNAL FIXATION (ORIF) ANKLE FRACTURE;  Surgeon: Nicholes Stairs, MD;  Location: [facility];  Service: Orthopedics;  Laterality: Right;  90 mins  . TUBAL LIGATION      There were no vitals filed for this visit.  Subjective Assessment - 10/13/19 1021    Subjective  Patient reports no new issues since last visit but notes ongoing aching on inside of R foot/ arch. Patient reports no  pain in ankle currently "just stiff".    Pertinent History  unremarkable    Limitations  Sitting;Lifting;Standing;Walking;House hold activities    Currently in Pain?  No/denies    Pain Onset  More than a month ago         10/13/19 1414  Assessment  Medical Diagnosis Rt ankle fx with ORIF   Referring Provider (PT) Victorino December  Onset Date/Surgical Date 07/17/19  AROM  Right Ankle Dorsiflexion -5 (was -6)  Right Ankle Plantar Flexion 55  Right Ankle Inversion 25  Right Ankle Eversion 15  Strength  Right Ankle Dorsiflexion 5/5 (was 4-/5)  Right Ankle Plantar Flexion 5/5 (was 4-/5)  Right Ankle Inversion 5/5 (was 4-/5)  Right Ankle Eversion 5/5 (was 4-/5)         [facility] Adult PT Treatment/Exercise - 10/13/19 0001      Exercises   Exercises  Ankle;Knee/Hip      Knee/Hip Exercises: Standing   Hip Abduction  Right;15 reps    Hip Extension  Right;15 reps    Step Down  Right;15 reps;Hand Hold: 2;Step Height: 2"    Functional Squat  2 sets;10 reps    Stairs  Stair amb x 2; 1 x using single crutch and step to pattern; 1 x using hand rail,  step to pattern    SLS  3 x 30" with HHA x1      Manual Therapy   Manual Therapy  Joint mobilization    Manual therapy comments  done seperate from all other aspects of treatment.     Joint Mobilization  Grade II-III subtalar mobs, medial/ lateral; Grade III A/P TC joint mobs with PROM stretching into DF       Ankle Exercises: Stretches   Slant Board Stretch  3 reps;30 seconds      Ankle Exercises: Standing   Heel Raises  20 reps;Both             PT Education - 10/13/19 1022    Education Details  Patient educated on increasing frequency of Gastroc stretching at home to improve RT ankle DF.    Person(s) Educated  Patient    Methods  Explanation;Demonstration    Comprehension  Verbalized understanding;Returned demonstration       PT Short Term Goals - 10/13/19 0920      PT SHORT TERM GOAL #1   Title  PT ROM in Rt LE is  wnl to allow normal gait    Baseline  Current: RT ankle AROm WNL except RT ankle DF: -5 degrees    Time  4    Period  Weeks    Status  Partially Met   all but DF   Target Date  11/07/19      PT SHORT TERM GOAL #2   Title  Pt to be ambulating without  assistive device and without walking boot    Baseline  Current: using single axillary crutch, no boot    Time  4    Period  Weeks    Status  Partially Met   10/13: crutches but no boot.   Target Date  11/07/19      PT SHORT TERM GOAL #3   Title  PT pain to be no greater than a 5/10 to allow pt to walk for up to 40 minutes at a time to be able to complete shopping/ housework activity.    Time  4    Period  Weeks    Status  Achieved        PT Long Term Goals - 10/13/19 4035      PT LONG TERM GOAL #1   Title  Pt strength of LE mm to be at least 4+/5 to allow pt to be able to go up and down 2 flights of steps with one hand rail assist without increased pain.    Baseline  RT ankle MMT 5/5 in all planes, able to ambulate stairs with step to pattern using rail with no pain, but with increased apprehension and stiffness.    Time  4    Period  Weeks    Status  Partially Met    Target Date  11/07/19      PT LONG TERM GOAL #2   Title  PT pain to be no greater than a 2/10 to allow pt to ambulate/ stand for 2 hours at a time to be ready for RTW duties.    Baseline  Patient reports being able to stand 45 min but is not sure about 2 hours    Time  4    Period  Weeks    Status  On-going    Target Date  11/07/19      PT LONG TERM GOAL #3   Title  PT to be able to single  leg stance on both LE for at least 45 seconds to allow pt to be confident walking  up/down hills on uneven terrain.    Baseline  Able to hold RT SLS for 30 seconds, with significant compensation and UE assist x 1.    Time  4    Period  Weeks    Status  On-going    Target Date  11/07/19            Plan - 10/13/19 1025    Clinical Impression Statement  Patient  continues to have difficutly with RT ankle DF AROM, which is negatively impacting gait and functional mobility. Updated patient goals this session, and educated patient on current progress. Patient requires frequent cueing for increasing weight shift to RLE during ambulation as well as with ambulating stairs to improve mechanics. Patient will continue to benefit from skilled therapy services to progress RT ankle DF AROM with targetd manual therapy, and to progress weight bearing tolerance, and balance on RLE to improve functional mobility outcomes.    Examination-Activity Limitations  Bathing;Bed Mobility;Caring for Others;Carry;Dressing;Lift;Locomotion Level;Sit;Sleep;Squat;Stairs;Stand;Toileting;Transfers    Examination-Participation Restrictions  Church;Cleaning;Community Activity;Driving;Interpersonal Relationship;Laundry;Meal Prep;Shop;Yard Work    Stability/Clinical Decision Making  Stable/Uncomplicated    Rehab Potential  Good    PT Frequency  3x / week    PT Duration  8 weeks    PT Treatment/Interventions  ADLs/Self Care Home Management;Cryotherapy;Electrical Stimulation;DME Instruction;Gait training;Stair training;Functional mobility training;Therapeutic activities;Therapeutic exercise;Balance training;Patient/family education;Manual techniques;Manual lymph drainage;Passive range of motion;Scar mobilization;Dry needling    PT Next Visit Plan  Continue to progress eccentric step downs to encourage RT ankle DF AROM as tolerated    PT Home Exercise Plan  ROM, Circles, and isometrics for dorsiflexion/plantarflexion, ABC, towel crunches, sidelying in/eversion, prone DF/PF> 11/2 self mobilization with LAX ball to bottom of foot (or soup can)       Patient will benefit from skilled therapeutic intervention in order to improve the following deficits and impairments:  Abnormal gait, Decreased activity tolerance, Decreased balance, Decreased endurance, Decreased knowledge of use of DME, Decreased  mobility, Decreased range of motion, Decreased strength, Difficulty walking, Increased edema, Increased fascial restricitons, Pain  Visit Diagnosis: Stiffness of right ankle, not elsewhere classified  Muscle weakness (generalized)  Other abnormalities of gait and mobility  Pain in right ankle and joints of right foot     Problem List Patient Active Problem List   Diagnosis Date Noted  . Closed right trimalleolar fracture, initial encounter 07/17/2019    2:16 PM, 10/13/19 Josue Hector PT DPT  Physical Therapist with [facility]  [phone] [facility] [address] [place], [place], [address] Phone: [phone]   Fax:  [phone]  Name: Terissa Haffey MRN: [id number] Date of Birth: 1966-10-08

## 2019-10-16 ENCOUNTER — Encounter (HOSPITAL_COMMUNITY): Payer: Self-pay | Admitting: Physical Therapy

## 2019-10-16 ENCOUNTER — Ambulatory Visit (HOSPITAL_COMMUNITY): Payer: 59 | Admitting: Physical Therapy

## 2019-10-16 ENCOUNTER — Other Ambulatory Visit: Payer: Self-pay

## 2019-10-16 DIAGNOSIS — R2689 Other abnormalities of gait and mobility: Secondary | ICD-10-CM

## 2019-10-16 DIAGNOSIS — M25571 Pain in right ankle and joints of right foot: Secondary | ICD-10-CM | POA: Diagnosis not present

## 2019-10-16 DIAGNOSIS — M6281 Muscle weakness (generalized): Secondary | ICD-10-CM

## 2019-10-16 DIAGNOSIS — M25671 Stiffness of right ankle, not elsewhere classified: Secondary | ICD-10-CM

## 2019-10-16 NOTE — Therapy (Signed)
[facility] [address] [place], [place], [address] Phone: [phone]   Fax:  [phone]  Physical Therapy Treatment  Patient Details  Name: Laura [name] MRN: [id number] Date of Birth: 09-Oct-1966 Referring Provider (PT): Victorino December   Encounter Date: 10/16/2019  PT End of Session - 10/16/19 0903    Visit Number  27    Number of Visits  34    Date for PT Re-Evaluation  11/08/19   Mini reassess done 10/13/19   Authorization Type  UMR; prog note needed at visit 28 cert needed 56/3    PT Start Time  0900    PT Stop Time  0942    PT Time Calculation (min)  42 min    Equipment Utilized During Treatment  Other (comment)   single crutch   Activity Tolerance  Patient tolerated treatment well   c/o stiffness through session   Behavior During Therapy  [facility] for tasks assessed/performed       Past Medical History:  Diagnosis Date  . Ankle fracture    Right  . ASCUS favor benign 2004   colposcopy negative  . Vertigo    on occasion     Past Surgical History:  Procedure Laterality Date  . ABDOMINAL HYSTERECTOMY  01/2010   Leiomyomata  . CESAREAN SECTION  1991   x1  . COLONOSCOPY N/A 09/03/2014   Procedure: COLONOSCOPY;  Surgeon: Rogene Houston, MD;  [facility];  Service: Endoscopy;  Laterality: N/A;  830  . COLPOSCOPY    . ORIF ANKLE FRACTURE Right 07/17/2019   Procedure: OPEN REDUCTION INTERNAL FIXATION (ORIF) ANKLE FRACTURE;  Surgeon: Nicholes Stairs, MD;  Location: [facility];  Service: Orthopedics;  Laterality: Right;  90 mins  . TUBAL LIGATION      There were no vitals filed for this visit.  Subjective Assessment - 10/16/19 0902    Subjective  Patient reports no pain "just stiffness". Patient says she has been doing more walking lately, but has not increased frequency of calf stretching with HEP.    Pertinent History  unremarkable    Limitations  Sitting;Lifting;Standing;Walking;House hold activities     Currently in Pain?  No/denies    Pain Onset  More than a month ago                       [facility] Adult PT Treatment/Exercise - 10/16/19 0001      Ambulation/Gait   Ambulation/Gait Assistance  6: Modified independent (Device/Increase time)    Ambulation Distance (Feet)  30 Feet    Gait Pattern  Step-through pattern;Decreased stride length;Decreased stance time - right;Decreased dorsiflexion - right    Gait Comments  Verbal cueing for roll through from heel to toe transition on RT      Knee/Hip Exercises: Stretches   Knee: Self-Stretch to increase Flexion  Right;5 reps;10 seconds    Knee: Self-Stretch Limitations  on 12 inch box    Gastroc Stretch  Right;3 reps;30 seconds    Gastroc Stretch Limitations  slant board      Knee/Hip Exercises: Standing   Heel Raises  Both;20 reps    Hip Abduction  Right;20 reps    Hip Extension  Right;20 reps    Lateral Step Up  Right;Step Height: 2";2 sets;10 reps;Hand Hold: 1    Forward Step Up  Right;2 sets;10 reps;Step Height: 4";Hand Hold: 1    Step Down  Right;2 sets;10 reps;Step Height: 4";Hand Hold: 2  Functional Squat  20 reps    Rocker Board  1 minute   front/ back; side/ side; 1 minute each; HHA x1   SLS  5 x 20" with HHA x1      Manual Therapy   Manual Therapy  Joint mobilization    Manual therapy comments  done seperate from all other aspects of treatment.     Joint Mobilization  Grade II-II MWM on 12 inch box for RT ankle DF              PT Education - 10/16/19 0956    Education Details  Patient educated on decreasing use of crutch with in home ambulation    Person(s) Educated  Patient    Methods  Explanation    Comprehension  Verbalized understanding       PT Short Term Goals - 10/13/19 0920      PT SHORT TERM GOAL #1   Title  PT ROM in Rt LE is wnl to allow normal gait    Baseline  Current: RT ankle AROm WNL except RT ankle DF: -5 degrees    Time  4    Period  Weeks    Status  Partially Met   all  but DF   Target Date  11/07/19      PT SHORT TERM GOAL #2   Title  Pt to be ambulating without  assistive device and without walking boot    Baseline  Current: using single axillary crutch, no boot    Time  4    Period  Weeks    Status  Partially Met   10/13: crutches but no boot.   Target Date  11/07/19      PT SHORT TERM GOAL #3   Title  PT pain to be no greater than a 5/10 to allow pt to walk for up to 40 minutes at a time to be able to complete shopping/ housework activity.    Time  4    Period  Weeks    Status  Achieved        PT Long Term Goals - 10/13/19 8101      PT LONG TERM GOAL #1   Title  Pt strength of LE mm to be at least 4+/5 to allow pt to be able to go up and down 2 flights of steps with one hand rail assist without increased pain.    Baseline  RT ankle MMT 5/5 in all planes, able to ambulate stairs with step to pattern using rail with no pain, but with increased apprehension and stiffness.    Time  4    Period  Weeks    Status  Partially Met    Target Date  11/07/19      PT LONG TERM GOAL #2   Title  PT pain to be no greater than a 2/10 to allow pt to ambulate/ stand for 2 hours at a time to be ready for RTW duties.    Baseline  Patient reports being able to stand 45 min but is not sure about 2 hours    Time  4    Period  Weeks    Status  On-going    Target Date  11/07/19      PT LONG TERM GOAL #3   Title  PT to be able to single leg stance on both LE for at least 45 seconds to allow pt to be confident walking  up/down hills on uneven terrain.  Baseline  Able to hold RT SLS for 30 seconds, with significant compensation and UE assist x 1.    Time  4    Period  Weeks    Status  On-going    Target Date  11/07/19            Plan - 10/16/19 0951    Clinical Impression Statement  Patient slowly progressing RT ankle DF with functional activity. Patient was able to progress to step up and step down on 4 inch box with HHA x 1. Patient had increased  difficulty with lateral step up. Patient requires frequent encouragement for progressing stretch/ mobility of Rt ankle with functional activity. Patient improving squat form, but still with slight weight shift to LT at bottom of movement. Patient able to correct to some degree with verbal cues. Patient able to ambulate 30 feet in clinic, with no AD, with verbal cues for heel to toe transition of RT foot, even weight bearing, and step through pattern. Will continue to progress as able. Patient educated on increasing time spent ambulating in home with decreased use of crutch.    Examination-Activity Limitations  Bathing;Bed Mobility;Caring for Others;Carry;Dressing;Lift;Locomotion Level;Sit;Sleep;Squat;Stairs;Stand;Toileting;Transfers    Examination-Participation Restrictions  Church;Cleaning;Community Activity;Driving;Interpersonal Relationship;Laundry;Meal Prep;Shop;Yard Work    Stability/Clinical Decision Making  Stable/Uncomplicated    Rehab Potential  Good    PT Frequency  3x / week    PT Duration  8 weeks    PT Treatment/Interventions  ADLs/Self Care Home Management;Cryotherapy;Electrical Stimulation;DME Instruction;Gait training;Stair training;Functional mobility training;Therapeutic activities;Therapeutic exercise;Balance training;Patient/family education;Manual techniques;Manual lymph drainage;Passive range of motion;Scar mobilization;Dry needling    PT Next Visit Plan  Continue to progress gait with no AD. Add tandem stance for ankle stability.    PT Home Exercise Plan  ROM, Circles, and isometrics for dorsiflexion/plantarflexion, ABC, towel crunches, sidelying in/eversion, prone DF/PF> 11/2 self mobilization with LAX ball to bottom of foot (or soup can)    Recommended Other Services  Increased number of requested visits to reflect patient need for continued services to improve functional mobility and reduce risk of falls.       Patient will benefit from skilled therapeutic intervention in  order to improve the following deficits and impairments:  Abnormal gait, Decreased activity tolerance, Decreased balance, Decreased endurance, Decreased knowledge of use of DME, Decreased mobility, Decreased range of motion, Decreased strength, Difficulty walking, Increased edema, Increased fascial restricitons, Pain  Visit Diagnosis: Stiffness of right ankle, not elsewhere classified  Muscle weakness (generalized)  Other abnormalities of gait and mobility  Pain in right ankle and joints of right foot     Problem List Patient Active Problem List   Diagnosis Date Noted  . Closed right trimalleolar fracture, initial encounter 07/17/2019   10:04 AM, 10/16/19 Josue Hector PT DPT  Physical Therapist with [facility]  [phone] [address] [place], [place], [address] Phone: [phone]   Fax:  [phone]  Name: Laura [name] MRN: [id number] Date of Birth: 1966/07/25

## 2019-10-17 ENCOUNTER — Encounter (HOSPITAL_COMMUNITY): Payer: Self-pay

## 2019-10-17 ENCOUNTER — Ambulatory Visit (HOSPITAL_COMMUNITY): Payer: 59

## 2019-10-17 DIAGNOSIS — M25571 Pain in right ankle and joints of right foot: Secondary | ICD-10-CM

## 2019-10-17 DIAGNOSIS — R2689 Other abnormalities of gait and mobility: Secondary | ICD-10-CM

## 2019-10-17 DIAGNOSIS — M25671 Stiffness of right ankle, not elsewhere classified: Secondary | ICD-10-CM

## 2019-10-17 DIAGNOSIS — M6281 Muscle weakness (generalized): Secondary | ICD-10-CM

## 2019-10-17 NOTE — Therapy (Signed)
[facility] [address] [place], [place], [address] Phone: [phone]   Fax:  [phone]  Physical Therapy Treatment  Patient Details  Name: Laura [name] MRN: [id number] Date of Birth: Aug 07, 1966 Referring Provider (PT): Victorino December   Encounter Date: 10/17/2019  PT End of Session - 10/17/19 1010    Visit Number  28    Number of Visits  34    Date for PT Re-Evaluation  11/08/19    Authorization Type  UMR; Cert: 2/24-->49/75    Authorization Time Period  Progress note complete on 10/13/19 visit 26    Authorization - Visit Number  28    Authorization - Number of Visits  36    PT Start Time  [phone]    PT Stop Time  1000    PT Time Calculation (min)  43 min    Equipment Utilized During Treatment  Other (comment)   1 axillary crutch   Activity Tolerance  Patient tolerated treatment well;No increased pain    Behavior During Therapy  WFL for tasks assessed/performed       Past Medical History:  Diagnosis Date  . Ankle fracture    Right  . ASCUS favor benign 2004   colposcopy negative  . Vertigo    on occasion     Past Surgical History:  Procedure Laterality Date  . ABDOMINAL HYSTERECTOMY  01/2010   Leiomyomata  . CESAREAN SECTION  1991   x1  . COLONOSCOPY N/A 09/03/2014   Procedure: COLONOSCOPY;  Surgeon: Rogene Houston, MD;  [facility];  Service: Endoscopy;  Laterality: N/A;  830  . COLPOSCOPY    . ORIF ANKLE FRACTURE Right 07/17/2019   Procedure: OPEN REDUCTION INTERNAL FIXATION (ORIF) ANKLE FRACTURE;  Surgeon: Nicholes Stairs, MD;  Location: [facility];  Service: Orthopedics;  Laterality: Right;  90 mins  . TUBAL LIGATION      There were no vitals filed for this visit.  Subjective Assessment - 10/17/19 1031    Subjective  No c/o pain just stiffness.  Received call from [facility], currently not interested in brace, going to wait a little longer as not ready to return to a boot.    Pertinent History   unremarkable    Patient Stated Goals  drive, walk without crutches or boot, bike    Currently in Pain?  No/denies                       [facility] Adult PT Treatment/Exercise - 10/17/19 0001      Ambulation/Gait   Ambulation/Gait Assistance  6: Modified independent (Device/Increase time)    Ambulation/Gait Assistance Details  --   none, 1 HHA   Ambulation Distance (Feet)  85 Feet    Gait Pattern  Step-through pattern;Decreased stride length;Decreased stance time - right;Decreased dorsiflexion - right    Gait Comments  Verbal cueing for roll through from heel to toe transition on RT      Knee/Hip Exercises: Stretches   Knee: Self-Stretch to increase Flexion  Right;5 reps;10 seconds    Knee: Self-Stretch Limitations  on 12 inch box    Gastroc Stretch  Right;3 reps;30 seconds    Gastroc Stretch Limitations  slant board      Knee/Hip Exercises: Standing   Heel Raises  Both;20 reps    Heel Raises Limitations  toe raises on slope    Forward Lunges  10 reps   tibial translation stretch wiht belt  Functional Squat  20 reps    SLS  5 x 20" with HHA x1             PT Education - 10/17/19 1007    Education Details  Educated purpose and benefits iwth JAZ brace    Person(s) Educated  Patient    Methods  Explanation    Comprehension  Verbalized understanding;Need further instruction       PT Short Term Goals - 10/13/19 0920      PT SHORT TERM GOAL #1   Title  PT ROM in Rt LE is wnl to allow normal gait    Baseline  Current: RT ankle AROm WNL except RT ankle DF: -5 degrees    Time  4    Period  Weeks    Status  Partially Met   all but DF   Target Date  11/07/19      PT SHORT TERM GOAL #2   Title  Pt to be ambulating without  assistive device and without walking boot    Baseline  Current: using single axillary crutch, no boot    Time  4    Period  Weeks    Status  Partially Met   10/13: crutches but no boot.   Target Date  11/07/19      PT SHORT TERM GOAL  #3   Title  PT pain to be no greater than a 5/10 to allow pt to walk for up to 40 minutes at a time to be able to complete shopping/ housework activity.    Time  4    Period  Weeks    Status  Achieved        PT Long Term Goals - 10/13/19 5277      PT LONG TERM GOAL #1   Title  Pt strength of LE mm to be at least 4+/5 to allow pt to be able to go up and down 2 flights of steps with one hand rail assist without increased pain.    Baseline  RT ankle MMT 5/5 in all planes, able to ambulate stairs with step to pattern using rail with no pain, but with increased apprehension and stiffness.    Time  4    Period  Weeks    Status  Partially Met    Target Date  11/07/19      PT LONG TERM GOAL #2   Title  PT pain to be no greater than a 2/10 to allow pt to ambulate/ stand for 2 hours at a time to be ready for RTW duties.    Baseline  Patient reports being able to stand 45 min but is not sure about 2 hours    Time  4    Period  Weeks    Status  On-going    Target Date  11/07/19      PT LONG TERM GOAL #3   Title  PT to be able to single leg stance on both LE for at least 45 seconds to allow pt to be confident walking  up/down hills on uneven terrain.    Baseline  Able to hold RT SLS for 30 seconds, with significant compensation and UE assist x 1.    Time  4    Period  Weeks    Status  On-going    Target Date  11/07/19            Plan - 10/17/19 1256    Clinical Impression Statement  Session focus on ankle mobility, balance and strengthening.  Gait training complete without AD, required HHA as decreased confidence with on stance phase.  Min cueing to improve form with stretches to assist with dorsiflexion.  Pt educated on purpose and benefits with JAZ brace, pt reports she doesn't want to sit wiht foot in boot for prolonger period of time.  Pt shown image and educated on purpose of prolonged stretches.  No reports of pain through session, just compliants of stiffness.     Examination-Activity Limitations  Bathing;Bed Mobility;Caring for Others;Carry;Dressing;Lift;Locomotion Level;Sit;Sleep;Squat;Stairs;Stand;Toileting;Transfers    Examination-Participation Restrictions  Church;Cleaning;Community Activity;Driving;Interpersonal Relationship;Laundry;Meal Prep;Shop;Yard Work    Stability/Clinical Decision Making  Stable/Uncomplicated    Clinical Decision Making  Low    Rehab Potential  Good    PT Frequency  3x / week    PT Duration  8 weeks    PT Treatment/Interventions  ADLs/Self Care Home Management;Cryotherapy;Electrical Stimulation;DME Instruction;Gait training;Stair training;Functional mobility training;Therapeutic activities;Therapeutic exercise;Balance training;Patient/family education;Manual techniques;Manual lymph drainage;Passive range of motion;Scar mobilization;Dry needling    PT Next Visit Plan  Continue to progress gait with no AD. Add tandem stance for ankle stability.    PT Home Exercise Plan  ROM, Circles, and isometrics for dorsiflexion/plantarflexion, ABC, towel crunches, sidelying in/eversion, prone DF/PF> 11/2 self mobilization with LAX ball to bottom of foot (or soup can)       Patient will benefit from skilled therapeutic intervention in order to improve the following deficits and impairments:  Abnormal gait, Decreased activity tolerance, Decreased balance, Decreased endurance, Decreased knowledge of use of DME, Decreased mobility, Decreased range of motion, Decreased strength, Difficulty walking, Increased edema, Increased fascial restricitons, Pain  Visit Diagnosis: Muscle weakness (generalized)  Other abnormalities of gait and mobility  Pain in right ankle and joints of right foot  Stiffness of right ankle, not elsewhere classified     Problem List Patient Active Problem List   Diagnosis Date Noted  . Closed right trimalleolar fracture, initial encounter 07/17/2019   Ihor Austin, [place]; [place]  Aldona Lento 10/17/2019, 1:01 PM  [facility] [place], [place], [address] Phone: [phone]   Fax:  [phone]  Name: Laura [name] MRN: [id number] Date of Birth: 1966/02/12

## 2019-10-20 ENCOUNTER — Encounter: Payer: Self-pay | Admitting: Gynecology

## 2019-10-20 ENCOUNTER — Other Ambulatory Visit: Payer: Self-pay

## 2019-10-20 ENCOUNTER — Ambulatory Visit (INDEPENDENT_AMBULATORY_CARE_PROVIDER_SITE_OTHER): Payer: 59 | Admitting: Gynecology

## 2019-10-20 VITALS — BP 120/78 | Ht 62.0 in | Wt 148.0 lb

## 2019-10-20 DIAGNOSIS — Z01419 Encounter for gynecological examination (general) (routine) without abnormal findings: Secondary | ICD-10-CM

## 2019-10-20 NOTE — Patient Instructions (Signed)
Follow-up in 1 year for annual exam 

## 2019-10-20 NOTE — Addendum Note (Signed)
Addended by: Nelva Nay on: 10/20/2019 09:30 AM   Modules accepted: Orders

## 2019-10-20 NOTE — Progress Notes (Signed)
    Laura [name] March 19, 1966 [id number]        53 y.o.  [id number] for annual gynecologic exam.  Without gynecologic complaints  Past medical history,surgical history, problem list, medications, allergies, family history and social history were all reviewed and documented as reviewed in the EPIC chart.  ROS:  Performed with pertinent positives and negatives included in the history, assessment and plan.   Additional significant findings : None   Exam: [name] assistant Vitals:   10/20/19 0839  BP: 120/78  Weight: 148 lb (67.1 kg)  Height: 5\' 2"  (1.575 m)   Body mass index is 27.07 kg/m.  General appearance:  Normal affect, orientation and appearance. Skin: Grossly normal HEENT: Without gross lesions.  No cervical or supraclavicular adenopathy. Thyroid normal.  Lungs:  Clear without wheezing, rales or rhonchi Cardiac: RR, without RMG Abdominal:  Soft, nontender, without masses, guarding, rebound, organomegaly or hernia Breasts:  Examined lying and sitting without masses, retractions, discharge or axillary adenopathy. Pelvic:  Ext, BUS, Vagina: Normal Pap smear of vaginal cuff done  Adnexa: Without masses or tenderness    Anus and perineum: Normal   Rectovaginal: Normal sphincter tone without palpated masses or tenderness.    Assessment/Plan:  53 y.o. [id number] female for annual gynecologic exam.  Status post TAH 2011 for leiomyoma  1. Postmenopausal.  No significant menopausal symptoms 2. Mammography 05/2019.  Continue with annual mammography when due.  Breast exam normal today. 3. Colonoscopy 2015.  Repeat at their recommended interval. 4. Pap smear 2017.  Pap smear done today.  No history of significant abnormal Pap smears.  Options to stop screening per current screening guidelines reviewed.  Will readdress on annual basis. 5. DEXA never.  Will plan at age 53. 22. Health maintenance.  No routine lab work done as patient does this elsewhere.  Follow-up 1 year, sooner as  needed.   Anastasio Auerbach MD, 8:55 AM 10/20/2019

## 2019-10-21 LAB — PAP IG W/ RFLX HPV ASCU

## 2019-10-22 NOTE — Addendum Note (Signed)
Addended by: Josue Hector A on: 10/22/2019 03:21 PM   Modules accepted: Orders

## 2019-10-23 ENCOUNTER — Other Ambulatory Visit: Payer: Self-pay

## 2019-10-23 ENCOUNTER — Ambulatory Visit (HOSPITAL_COMMUNITY): Payer: 59

## 2019-10-23 ENCOUNTER — Encounter (HOSPITAL_COMMUNITY): Payer: Self-pay

## 2019-10-23 ENCOUNTER — Encounter (HOSPITAL_COMMUNITY): Payer: 59 | Admitting: Physical Therapy

## 2019-10-23 DIAGNOSIS — M6281 Muscle weakness (generalized): Secondary | ICD-10-CM

## 2019-10-23 DIAGNOSIS — M25571 Pain in right ankle and joints of right foot: Secondary | ICD-10-CM

## 2019-10-23 DIAGNOSIS — R2689 Other abnormalities of gait and mobility: Secondary | ICD-10-CM

## 2019-10-23 DIAGNOSIS — M25671 Stiffness of right ankle, not elsewhere classified: Secondary | ICD-10-CM

## 2019-10-23 NOTE — Therapy (Signed)
[facility] [place], [place], [address] Phone: [phone]   Fax:  [phone]  Physical Therapy Treatment  Patient Details  Name: Laura [name] MRN: [id number] Date of Birth: 1966/06/05 Referring Provider (PT): Victorino December   Encounter Date: 10/23/2019  PT End of Session - 10/23/19 1749    Visit Number  29    Number of Visits  34    Date for PT Re-Evaluation  11/08/19    Authorization Type  UMR; Cert: 6/25-->63/89    Authorization Time Period  Progress note complete on 10/13/19 visit 26    Authorization - Visit Number  29    Authorization - Number of Visits  36    PT Start Time  3734    PT Stop Time  1828    PT Time Calculation (min)  43 min    Equipment Utilized During Treatment  Other (comment)   1 axillary crutch   Activity Tolerance  Patient tolerated treatment well;No increased pain    Behavior During Therapy  WFL for tasks assessed/performed       Past Medical History:  Diagnosis Date  . Ankle fracture    Right  . ASCUS favor benign 2004   colposcopy negative  . Vertigo    on occasion     Past Surgical History:  Procedure Laterality Date  . ABDOMINAL HYSTERECTOMY  01/2010   Leiomyomata  . CESAREAN SECTION  1991   x1  . COLONOSCOPY N/A 09/03/2014   Procedure: COLONOSCOPY;  Surgeon: Rogene Houston, MD;  [facility];  Service: Endoscopy;  Laterality: N/A;  830  . COLPOSCOPY    . ORIF ANKLE FRACTURE Right 07/17/2019   Procedure: OPEN REDUCTION INTERNAL FIXATION (ORIF) ANKLE FRACTURE;  Surgeon: Nicholes Stairs, MD;  Location: [facility];  Service: Orthopedics;  Laterality: Right;  90 mins  . TUBAL LIGATION      There were no vitals filed for this visit.  Subjective Assessment - 10/23/19 1745    Subjective  No pain just stiffness.  Reports she walks around the house some without AD.    Pertinent History  unremarkable    Patient Stated Goals  drive, walk without crutches or boot,  bike    Currently in Pain?  No/denies                       [facility] Adult PT Treatment/Exercise - 10/23/19 0001      Exercises   Exercises  Ankle;Knee/Hip      Knee/Hip Exercises: Stretches   Knee: Self-Stretch to increase Flexion  Right;5 reps;10 seconds    Knee: Self-Stretch Limitations  on 12 inch box    Gastroc Stretch  Right;3 reps;30 seconds    Gastroc Stretch Limitations  slant board      Knee/Hip Exercises: Aerobic   Tread Mill  Gait training 1.0       Knee/Hip Exercises: Standing   Heel Raises  Both;20 reps    Heel Raises Limitations  toe raises on slope    Forward Lunges  10 reps   tibial translation stretch x 10" holds   Functional Squat  20 reps    Stairs  3RT 4in height 5RT 7in step height    SLS  3x 30" wiht 2 finger A      Knee/Hip Exercises: Prone   Other Prone Exercises  Yoga: plank rocking    Other Prone Exercises  Downward dog 2x 30"  PT Short Term Goals - 10/13/19 0920      PT SHORT TERM GOAL #1   Title  PT ROM in Rt LE is wnl to allow normal gait    Baseline  Current: RT ankle AROm WNL except RT ankle DF: -5 degrees    Time  4    Period  Weeks    Status  Partially Met   all but DF   Target Date  11/07/19      PT SHORT TERM GOAL #2   Title  Pt to be ambulating without  assistive device and without walking boot    Baseline  Current: using single axillary crutch, no boot    Time  4    Period  Weeks    Status  Partially Met   10/13: crutches but no boot.   Target Date  11/07/19      PT SHORT TERM GOAL #3   Title  PT pain to be no greater than a 5/10 to allow pt to walk for up to 40 minutes at a time to be able to complete shopping/ housework activity.    Time  4    Period  Weeks    Status  Achieved        PT Long Term Goals - 10/13/19 1324      PT LONG TERM GOAL #1   Title  Pt strength of LE mm to be at least 4+/5 to allow pt to be able to go up and down 2 flights of steps with one hand rail assist  without increased pain.    Baseline  RT ankle MMT 5/5 in all planes, able to ambulate stairs with step to pattern using rail with no pain, but with increased apprehension and stiffness.    Time  4    Period  Weeks    Status  Partially Met    Target Date  11/07/19      PT LONG TERM GOAL #2   Title  PT pain to be no greater than a 2/10 to allow pt to ambulate/ stand for 2 hours at a time to be ready for RTW duties.    Baseline  Patient reports being able to stand 45 min but is not sure about 2 hours    Time  4    Period  Weeks    Status  On-going    Target Date  11/07/19      PT LONG TERM GOAL #3   Title  PT to be able to single leg stance on both LE for at least 45 seconds to allow pt to be confident walking  up/down hills on uneven terrain.    Baseline  Able to hold RT SLS for 30 seconds, with significant compensation and UE assist x 1.    Time  4    Period  Weeks    Status  On-going    Target Date  11/07/19            Plan - 10/23/19 1837    Clinical Impression Statement  Pt tolerated well to session focusing on ankle mobility, functional strengthening and gait training without AD.  Resumed gait training on treadmile to improve awareness of strance time and equal stride length.  Pt making improvements with stair mobility, able to complete reciprocal pattern up 4in then 7in height with vast improvements descending.  Pt continues to be limited with SLS and dorsiflexion with AROM -4 degrees.    Examination-Activity Limitations  Bathing;Bed  Mobility;Caring for Others;Carry;Dressing;Lift;Locomotion Level;Sit;Sleep;Squat;Stairs;Stand;Toileting;Transfers    Examination-Participation Restrictions  Church;Cleaning;Community Activity;Driving;Interpersonal Relationship;Laundry;Meal Prep;Shop;Yard Work    Stability/Clinical Decision Making  Stable/Uncomplicated    Clinical Decision Making  Low    Rehab Potential  Good    PT Frequency  3x / week    PT Duration  8 weeks    PT  Treatment/Interventions  ADLs/Self Care Home Management;Cryotherapy;Electrical Stimulation;DME Instruction;Gait training;Stair training;Functional mobility training;Therapeutic activities;Therapeutic exercise;Balance training;Patient/family education;Manual techniques;Manual lymph drainage;Passive range of motion;Scar mobilization;Dry needling    PT Next Visit Plan  Continue to progress gait with no AD. Add tandem stance for ankle stability and balance.    PT Home Exercise Plan  ROM, Circles, and isometrics for dorsiflexion/plantarflexion, ABC, towel crunches, sidelying in/eversion, prone DF/PF> 11/2 self mobilization with LAX ball to bottom of foot (or soup can)       Patient will benefit from skilled therapeutic intervention in order to improve the following deficits and impairments:  Abnormal gait, Decreased activity tolerance, Decreased balance, Decreased endurance, Decreased knowledge of use of DME, Decreased mobility, Decreased range of motion, Decreased strength, Difficulty walking, Increased edema, Increased fascial restricitons, Pain  Visit Diagnosis: Muscle weakness (generalized)  Other abnormalities of gait and mobility  Pain in right ankle and joints of right foot  Stiffness of right ankle, not elsewhere classified     Problem List Patient Active Problem List   Diagnosis Date Noted  . Closed right trimalleolar fracture, initial encounter 07/17/2019   Ihor Austin, [place] Aldona Lento 10/23/2019, 6:41 PM  [place], [place], [address] Phone: [phone]   Fax:  [phone]  Name: Laura [name] MRN: [id number] Date of Birth: [birth year]

## 2019-10-24 ENCOUNTER — Encounter (HOSPITAL_COMMUNITY): Payer: Self-pay | Admitting: Physical Therapy

## 2019-10-24 ENCOUNTER — Ambulatory Visit (HOSPITAL_COMMUNITY): Payer: 59 | Admitting: Physical Therapy

## 2019-10-24 DIAGNOSIS — M25571 Pain in right ankle and joints of right foot: Secondary | ICD-10-CM | POA: Diagnosis not present

## 2019-10-24 DIAGNOSIS — M25671 Stiffness of right ankle, not elsewhere classified: Secondary | ICD-10-CM

## 2019-10-24 DIAGNOSIS — M6281 Muscle weakness (generalized): Secondary | ICD-10-CM | POA: Diagnosis not present

## 2019-10-24 DIAGNOSIS — R2689 Other abnormalities of gait and mobility: Secondary | ICD-10-CM | POA: Diagnosis not present

## 2019-10-24 NOTE — Therapy (Signed)
[facility] [address] [place], [place], [address] Phone: [phone]   Fax:  [phone]  Physical Therapy Treatment  Patient Details  Name: Laura [name] MRN: [id number] Date of Birth: 25-Oct-1966 Referring Provider (PT): Victorino December   Encounter Date: 10/24/2019  PT End of Session - 10/24/19 0917    Visit Number  30    Number of Visits  34    Date for PT Re-Evaluation  11/08/19    Authorization Type  UMR; Cert: 0/17-->79/39    Authorization Time Period  Progress note complete on 10/13/19 visit 26    Authorization - Visit Number  30    Authorization - Number of Visits  36    PT Start Time  0917    PT Stop Time  0957    PT Time Calculation (min)  40 min    Equipment Utilized During Treatment  Other (comment)   1 axillary crutch   Activity Tolerance  Patient tolerated treatment well;No increased pain    Behavior During Therapy  WFL for tasks assessed/performed       Past Medical History:  Diagnosis Date  . Ankle fracture    Right  . ASCUS favor benign 2004   colposcopy negative  . Vertigo    on occasion     Past Surgical History:  Procedure Laterality Date  . ABDOMINAL HYSTERECTOMY  01/2010   Leiomyomata  . CESAREAN SECTION  1991   x1  . COLONOSCOPY N/A 09/03/2014   Procedure: COLONOSCOPY;  Surgeon: Rogene Houston, MD;  [facility];  Service: Endoscopy;  Laterality: N/A;  830  . COLPOSCOPY    . ORIF ANKLE FRACTURE Right 07/17/2019   Procedure: OPEN REDUCTION INTERNAL FIXATION (ORIF) ANKLE FRACTURE;  Surgeon: Nicholes Stairs, MD;  Location: [facility];  Service: Orthopedics;  Laterality: Right;  90 mins  . TUBAL LIGATION      There were no vitals filed for this visit.  Subjective Assessment - 10/24/19 0919    Subjective  No pain just stiffness. Walking some with out the cane.    Pertinent History  unremarkable    Patient Stated Goals  drive, walk without crutches or boot, bike    Currently in  Pain?  Yes    Pain Score  6     Pain Location  Ankle    Pain Orientation  Right    Pain Descriptors / Indicators  Other (Comment)   stiffness        [facility] PT Assessment - 10/24/19 0001      Assessment   Medical Diagnosis  Rt ankle fx with ORIF       AROM   Right Ankle Dorsiflexion  -5   lacking   Right Ankle Plantar Flexion  55                   [facility] Adult PT Treatment/Exercise - 10/24/19 0001      Knee/Hip Exercises: Standing   Other Standing Knee Exercises  SLS on floor on R 3x30", Narrow BOS with right lateral/forward reaches (both hands to target) 4x6      Knee/Hip Exercises: Supine   Other Supine Knee/Hip Exercises  Total Gym pos 20 - for ankle mobility - overpressure at TCJ  3x8, 10" holds R    Other Supine Knee/Hip Exercises  PROM of right ankle with sustained holds into inversion/eversion/toe flexion/extension      Manual Therapy   Manual Therapy  Edema management;Soft  tissue mobilization    Manual therapy comments  done seperate from all other aspects of treatment.     Joint Mobilization  metatarsal mobilzation R - grade II/III, mid foot cunneiform mobilization grade II/III - tolerated moderately well.     Soft tissue mobilization  STM to right arch and extensors - tolerated moderatelly well       Ankle Exercises: Standing   Other Standing Ankle Exercises  knee drives on 12" bench - TCJ over pressure x10, with 30 bouts at ankle             PT Education - 10/24/19 0933    Education Details  on JAZ brace, on needs/benefits of prolonged/sustained stretch to achieve desired mobility at this stange. Answered all questions about the current ROm, status and about how some patients need the brace to achieve mobility in ankle    Person(s) Educated  Patient    Methods  Explanation    Comprehension  Verbalized understanding       PT Short Term Goals - 10/13/19 0920      PT SHORT TERM GOAL #1   Title  PT ROM in Rt LE is wnl to allow normal gait     Baseline  Current: RT ankle AROm WNL except RT ankle DF: -5 degrees    Time  4    Period  Weeks    Status  Partially Met   all but DF   Target Date  11/07/19      PT SHORT TERM GOAL #2   Title  Pt to be ambulating without  assistive device and without walking boot    Baseline  Current: using single axillary crutch, no boot    Time  4    Period  Weeks    Status  Partially Met   10/13: crutches but no boot.   Target Date  11/07/19      PT SHORT TERM GOAL #3   Title  PT pain to be no greater than a 5/10 to allow pt to walk for up to 40 minutes at a time to be able to complete shopping/ housework activity.    Time  4    Period  Weeks    Status  Achieved        PT Long Term Goals - 10/13/19 3474      PT LONG TERM GOAL #1   Title  Pt strength of LE mm to be at least 4+/5 to allow pt to be able to go up and down 2 flights of steps with one hand rail assist without increased pain.    Baseline  RT ankle MMT 5/5 in all planes, able to ambulate stairs with step to pattern using rail with no pain, but with increased apprehension and stiffness.    Time  4    Period  Weeks    Status  Partially Met    Target Date  11/07/19      PT LONG TERM GOAL #2   Title  PT pain to be no greater than a 2/10 to allow pt to ambulate/ stand for 2 hours at a time to be ready for RTW duties.    Baseline  Patient reports being able to stand 45 min but is not sure about 2 hours    Time  4    Period  Weeks    Status  On-going    Target Date  11/07/19      PT LONG TERM GOAL #3  Title  PT to be able to single leg stance on both LE for at least 45 seconds to allow pt to be confident walking  up/down hills on uneven terrain.    Baseline  Able to hold RT SLS for 30 seconds, with significant compensation and UE assist x 1.    Time  4    Period  Weeks    Status  On-going    Target Date  11/07/19            Plan - 10/24/19 1128    Clinical Impression Statement  Discussed benefits of JAZ brace and  patient not open to brace at this time. Discussed current ROM deficits and need for prolonged/sustained stretch to achieve great ROM in DF to improve ability to ambulate and tolerate weightbearing tasks. Patient visually shut down after this conversation, stating she wants to stick with rehab right now and would consider brace after therapy if need be. Tolerance to today's session was poor, soreness noted along medial aspect of foot with talocrural mobilizations with movement. Continued limitations in gait noted end of session (most likely due to soreness in foot). Will continue to work on improving strength and ROM in subsequent sessions to improve functional mobility.    Examination-Activity Limitations  Bathing;Bed Mobility;Caring for Others;Carry;Dressing;Lift;Locomotion Level;Sit;Sleep;Squat;Stairs;Stand;Toileting;Transfers    Examination-Participation Restrictions  Church;Cleaning;Community Activity;Driving;Interpersonal Relationship;Laundry;Meal Prep;Shop;Yard Work    Stability/Clinical Decision Making  Stable/Uncomplicated    Rehab Potential  Good    PT Frequency  3x / week    PT Duration  8 weeks    PT Treatment/Interventions  ADLs/Self Care Home Management;Cryotherapy;Electrical Stimulation;DME Instruction;Gait training;Stair training;Functional mobility training;Therapeutic activities;Therapeutic exercise;Balance training;Patient/family education;Manual techniques;Manual lymph drainage;Passive range of motion;Scar mobilization;Dry needling    PT Next Visit Plan  Continue to progress gait with no AD. Add tandem stance for ankle stability and balance and reaches anterior/lateral    PT Home Exercise Plan  ROM, Circles, and isometrics for dorsiflexion/plantarflexion, ABC, towel crunches, sidelying in/eversion, prone DF/PF> 11/2 self mobilization with LAX ball to bottom of foot (or soup can)       Patient will benefit from skilled therapeutic intervention in order to improve the following deficits  and impairments:  Abnormal gait, Decreased activity tolerance, Decreased balance, Decreased endurance, Decreased knowledge of use of DME, Decreased mobility, Decreased range of motion, Decreased strength, Difficulty walking, Increased edema, Increased fascial restricitons, Pain  Visit Diagnosis: Muscle weakness (generalized)  Other abnormalities of gait and mobility  Pain in right ankle and joints of right foot  Stiffness of right ankle, not elsewhere classified     Problem List Patient Active Problem List   Diagnosis Date Noted  . Closed right trimalleolar fracture, initial encounter 07/17/2019    11:32 AM, 10/24/19 Jerene Pitch, DPT Physical Therapy with [facility]  [phone] office  [facility] [address] [place], [place], [address] Phone: [phone]   Fax:  [phone]  Name: Laura [name] MRN: [id number] Date of Birth: Oct 14, 1966

## 2019-10-27 ENCOUNTER — Ambulatory Visit (HOSPITAL_COMMUNITY): Payer: 59 | Admitting: Physical Therapy

## 2019-10-27 ENCOUNTER — Other Ambulatory Visit: Payer: Self-pay

## 2019-10-27 ENCOUNTER — Encounter (HOSPITAL_COMMUNITY): Payer: 59 | Admitting: Physical Therapy

## 2019-10-27 ENCOUNTER — Telehealth (HOSPITAL_COMMUNITY): Payer: Self-pay | Admitting: Physical Therapy

## 2019-10-27 DIAGNOSIS — M6281 Muscle weakness (generalized): Secondary | ICD-10-CM | POA: Diagnosis not present

## 2019-10-27 DIAGNOSIS — M25571 Pain in right ankle and joints of right foot: Secondary | ICD-10-CM

## 2019-10-27 DIAGNOSIS — R2689 Other abnormalities of gait and mobility: Secondary | ICD-10-CM

## 2019-10-27 DIAGNOSIS — M25671 Stiffness of right ankle, not elsewhere classified: Secondary | ICD-10-CM | POA: Diagnosis not present

## 2019-10-27 NOTE — Telephone Encounter (Signed)
pt has to work today

## 2019-10-27 NOTE — Therapy (Addendum)
[facility] [address] [place], [place], [address] Phone: [phone]   Fax:  [phone]  Physical Therapy Treatment  Patient Details  Name: Laura [name] MRN: [id number] Date of Birth: 10-09-1966 Referring Provider (PT): Victorino December   Encounter Date: 10/27/2019  PT End of Session - 10/27/19 1123    Visit Number  31    Number of Visits  34    Date for PT Re-Evaluation  11/08/19    Authorization Type  UMR; Cert: 1/27-->51/70    Authorization Time Period  Progress note complete on 10/13/19 visit 63    Authorization - Visit Number  31    Authorization - Number of Visits  36    PT Start Time  0920    PT Stop Time  1005    PT Time Calculation (min)  45 min    Equipment Utilized During Treatment  Other (comment)   1 axillary crutch   Activity Tolerance  Patient tolerated treatment well;No increased pain    Behavior During Therapy  WFL for tasks assessed/performed       Past Medical History:  Diagnosis Date  . Ankle fracture    Right  . ASCUS favor benign 2004   colposcopy negative  . Vertigo    on occasion     Past Surgical History:  Procedure Laterality Date  . ABDOMINAL HYSTERECTOMY  01/2010   Leiomyomata  . CESAREAN SECTION  1991   x1  . COLONOSCOPY N/A 09/03/2014   Procedure: COLONOSCOPY;  Surgeon: Rogene Houston, MD;  [facility];  Service: Endoscopy;  Laterality: N/A;  830  . COLPOSCOPY    . ORIF ANKLE FRACTURE Right 07/17/2019   Procedure: OPEN REDUCTION INTERNAL FIXATION (ORIF) ANKLE FRACTURE;  Surgeon: Nicholes Stairs, MD;  Location: [facility];  Service: Orthopedics;  Laterality: Right;  90 mins  . TUBAL LIGATION      There were no vitals filed for this visit.  Subjective Assessment - 10/27/19 1321    Subjective  pt states she decided she would try the JAS brace. States she called the company rep back and left a message.  No pain today.  Comes today using 1 axillary crutch for ambulation.     Currently in Pain?  No/denies                       [facility] Adult PT Treatment/Exercise - 10/27/19 0001      Knee/Hip Exercises: Stretches   Knee: Self-Stretch to increase Flexion  Right;5 reps;10 seconds    Knee: Self-Stretch Limitations  on 12 inch box    Gastroc Stretch  Right;3 reps;30 seconds    Gastroc Stretch Limitations  slant board      Knee/Hip Exercises: Standing   Stairs  3Rt 7" height    Gait Training  without AD full lap working on heel-toe gait      Manual Therapy   Manual Therapy  Soft tissue mobilization;Myofascial release;Passive ROM;Joint mobilization    Manual therapy comments  done seperate from all other aspects of treatment.     Joint Mobilization  midfoot, forefoot and ankle    Soft tissue mobilization  to foot and ankle    Myofascial Release  to scar tissue perimeter of ankle and foot    Passive ROM  to increase ROM of DF               PT Short Term Goals - 10/13/19 0174  PT SHORT TERM GOAL #1   Title  PT ROM in Rt LE is wnl to allow normal gait    Baseline  Current: RT ankle AROm WNL except RT ankle DF: -5 degrees    Time  4    Period  Weeks    Status  Partially Met   all but DF   Target Date  11/07/19      PT SHORT TERM GOAL #2   Title  Pt to be ambulating without  assistive device and without walking boot    Baseline  Current: using single axillary crutch, no boot    Time  4    Period  Weeks    Status  Partially Met   10/13: crutches but no boot.   Target Date  11/07/19      PT SHORT TERM GOAL #3   Title  PT pain to be no greater than a 5/10 to allow pt to walk for up to 40 minutes at a time to be able to complete shopping/ housework activity.    Time  4    Period  Weeks    Status  Achieved        PT Long Term Goals - 10/13/19 9323      PT LONG TERM GOAL #1   Title  Pt strength of LE mm to be at least 4+/5 to allow pt to be able to go up and down 2 flights of steps with one hand rail assist without  increased pain.    Baseline  RT ankle MMT 5/5 in all planes, able to ambulate stairs with step to pattern using rail with no pain, but with increased apprehension and stiffness.    Time  4    Period  Weeks    Status  Partially Met    Target Date  11/07/19      PT LONG TERM GOAL #2   Title  PT pain to be no greater than a 2/10 to allow pt to ambulate/ stand for 2 hours at a time to be ready for RTW duties.    Baseline  Patient reports being able to stand 45 min but is not sure about 2 hours    Time  4    Period  Weeks    Status  On-going    Target Date  11/07/19      PT LONG TERM GOAL #3   Title  PT to be able to single leg stance on both LE for at least 45 seconds to allow pt to be confident walking  up/down hills on uneven terrain.    Baseline  Able to hold RT SLS for 30 seconds, with significant compensation and UE assist x 1.    Time  4    Period  Weeks    Status  On-going    Target Date  11/07/19            Plan - 10/27/19 1125    Clinical Impression Statement  pt agreeable to getting the JAS to assist with return of ROM.  Began session with manual to help loosen tissue adding MFR to mobilize scar tissue.  Pt with quite a bit perimeter of ankle.  Joint mobilizations completed to work on ROM with PROM following.  Pt continues to require encouragement to push through session.  No pain reported during or at conclusion of session.    Examination-Activity Limitations  Bathing;Bed Mobility;Caring for Others;Carry;Dressing;Lift;Locomotion Level;Sit;Sleep;Squat;Stairs;Stand;Toileting;Transfers    Examination-Participation Restrictions  Church;Cleaning;Community Activity;Driving;Interpersonal  Relationship;Laundry;Meal Prep;Shop;Yard Work    Stability/Clinical Decision Making  Stable/Uncomplicated    Rehab Potential  Good    PT Frequency  3x / week    PT Duration  8 weeks    PT Treatment/Interventions  ADLs/Self Care Home Management;Cryotherapy;Electrical Stimulation;DME  Instruction;Gait training;Stair training;Functional mobility training;Therapeutic activities;Therapeutic exercise;Balance training;Patient/family education;Manual techniques;Manual lymph drainage;Passive range of motion;Scar mobilization;Dry needling    PT Next Visit Plan  Continue to progress gait with no AD. Check on progression of obtaining JAS brace next session.    PT Home Exercise Plan  ROM, Circles, and isometrics for dorsiflexion/plantarflexion, ABC, towel crunches, sidelying in/eversion, prone DF/PF> 11/2 self mobilization with LAX ball to bottom of foot (or soup can)       Patient will benefit from skilled therapeutic intervention in order to improve the following deficits and impairments:  Abnormal gait, Decreased activity tolerance, Decreased balance, Decreased endurance, Decreased knowledge of use of DME, Decreased mobility, Decreased range of motion, Decreased strength, Difficulty walking, Increased edema, Increased fascial restricitons, Pain  Visit Diagnosis: Muscle weakness (generalized)  Other abnormalities of gait and mobility  Pain in right ankle and joints of right foot     Problem List Patient Active Problem List   Diagnosis Date Noted  . Closed right trimalleolar fracture, initial encounter 07/17/2019   Teena Irani, PTA/CLT [phone]  Teena Irani 10/27/2019, 1:22 PM  [facility] [address] [place], [place], [address] Phone: [phone]   Fax:  [phone]  Name: Laura [name] MRN: [id number] Date of Birth: 1966/01/12

## 2019-10-28 ENCOUNTER — Encounter (HOSPITAL_COMMUNITY): Payer: Self-pay | Admitting: Physical Therapy

## 2019-10-28 ENCOUNTER — Ambulatory Visit (HOSPITAL_COMMUNITY): Payer: 59 | Admitting: Physical Therapy

## 2019-10-28 DIAGNOSIS — M6281 Muscle weakness (generalized): Secondary | ICD-10-CM

## 2019-10-28 DIAGNOSIS — M25671 Stiffness of right ankle, not elsewhere classified: Secondary | ICD-10-CM | POA: Diagnosis not present

## 2019-10-28 DIAGNOSIS — M25571 Pain in right ankle and joints of right foot: Secondary | ICD-10-CM | POA: Diagnosis not present

## 2019-10-28 DIAGNOSIS — R2689 Other abnormalities of gait and mobility: Secondary | ICD-10-CM | POA: Diagnosis not present

## 2019-10-28 NOTE — Therapy (Signed)
[facility] [address] [place], [place], [address] Phone: [phone]   Fax:  [phone]  Physical Therapy Treatment  Patient Details  Name: Laura [name] MRN: [id number] Date of Birth: 09-18-1966 Referring Provider (PT): Victorino December   Encounter Date: 10/28/2019  PT End of Session - 10/28/19 1010    Visit Number  32    Number of Visits  34    Date for PT Re-Evaluation  11/08/19    Authorization Type  UMR; Cert: 1/79-->15/05    Authorization Time Period  Progress note complete on 10/13/19 visit 26    Authorization - Visit Number  32    Authorization - Number of Visits  36    PT Start Time  0832    PT Stop Time  0913    PT Time Calculation (min)  41 min    Equipment Utilized During Treatment  Other (comment)   1 axillary crutch   Activity Tolerance  Patient tolerated treatment well;No increased pain    Behavior During Therapy  WFL for tasks assessed/performed       Past Medical History:  Diagnosis Date  . Ankle fracture    Right  . ASCUS favor benign 2004   colposcopy negative  . Vertigo    on occasion     Past Surgical History:  Procedure Laterality Date  . ABDOMINAL HYSTERECTOMY  01/2010   Leiomyomata  . CESAREAN SECTION  1991   x1  . COLONOSCOPY N/A 09/03/2014   Procedure: COLONOSCOPY;  Surgeon: Rogene Houston, MD;  [facility];  Service: Endoscopy;  Laterality: N/A;  830  . COLPOSCOPY    . ORIF ANKLE FRACTURE Right 07/17/2019   Procedure: OPEN REDUCTION INTERNAL FIXATION (ORIF) ANKLE FRACTURE;  Surgeon: Nicholes Stairs, MD;  Location: [facility];  Service: Orthopedics;  Laterality: Right;  90 mins  . TUBAL LIGATION      There were no vitals filed for this visit.  Subjective Assessment - 10/28/19 0833    Subjective  Pt has been trying to walk without her crutch.  Continues to have stiffness and swelling in her ankle.   Pt called [employer] and is awaiting a call from a local rep.    Pertinent  History  unremarkable    Patient Stated Goals  drive, walk without crutches or boot, bike    Currently in Pain?  No/denies             [facility] Adult PT Treatment/Exercise - 10/28/19 0001      Ambulation/Gait   Ambulation Distance (Feet)  130 Feet    Gait Pattern  Step-through pattern;Decreased stride length;Decreased stance time - right;Decreased dorsiflexion - right    Gait Comments  no assistive device       Knee/Hip Exercises: Stretches   Knee: Self-Stretch to increase Flexion  Right;5 reps;10 seconds    Knee: Self-Stretch Limitations  on 12 inch box    Gastroc Stretch  Right;3 reps;30 seconds    Gastroc Stretch Limitations  slant board      Manual Therapy   Manual Therapy  Soft tissue mobilization;Myofascial release;Passive ROM;Joint mobilization    Manual therapy comments  done seperate from all other aspects of treatment.     Soft tissue mobilization  to gastrocsoleus  complex     Passive ROM  to increase ROM of DF      Ankle Exercises: Standing   Toe Raise  15 reps    Toe Raise Limitations  decline slope      Ankle Exercises: Stretches   Other Stretch  Knee drivers; 2 sets 30 seconds      Ankle Exercises: Aerobic   Other Aerobic  total gym for plantar stretch x 2'      Ankle Exercises: Supine   Other Supine Ankle Exercises  contract/relax for improved dorsiflexion.               PT Short Term Goals - 10/13/19 0920      PT SHORT TERM GOAL #1   Title  PT ROM in Rt LE is wnl to allow normal gait    Baseline  Current: RT ankle AROm WNL except RT ankle DF: -5 degrees    Time  4    Period  Weeks    Status  Partially Met   all but DF   Target Date  11/07/19      PT SHORT TERM GOAL #2   Title  Pt to be ambulating without  assistive device and without walking boot    Baseline  Current: using single axillary crutch, no boot    Time  4    Period  Weeks    Status  Partially Met   10/13: crutches but no boot.   Target Date  11/07/19      PT SHORT TERM  GOAL #3   Title  PT pain to be no greater than a 5/10 to allow pt to walk for up to 40 minutes at a time to be able to complete shopping/ housework activity.    Time  4    Period  Weeks    Status  Achieved        PT Long Term Goals - 10/13/19 0630      PT LONG TERM GOAL #1   Title  Pt strength of LE mm to be at least 4+/5 to allow pt to be able to go up and down 2 flights of steps with one hand rail assist without increased pain.    Baseline  RT ankle MMT 5/5 in all planes, able to ambulate stairs with step to pattern using rail with no pain, but with increased apprehension and stiffness.    Time  4    Period  Weeks    Status  Partially Met    Target Date  11/07/19      PT LONG TERM GOAL #2   Title  PT pain to be no greater than a 2/10 to allow pt to ambulate/ stand for 2 hours at a time to be ready for RTW duties.    Baseline  Patient reports being able to stand 45 min but is not sure about 2 hours    Time  4    Period  Weeks    Status  On-going    Target Date  11/07/19      PT LONG TERM GOAL #3   Title  PT to be able to single leg stance on both LE for at least 45 seconds to allow pt to be confident walking  up/down hills on uneven terrain.    Baseline  Able to hold RT SLS for 30 seconds, with significant compensation and UE assist x 1.    Time  4    Period  Weeks    Status  On-going    Target Date  11/07/19            Plan - 10/28/19 1010    Clinical Impression Statement  Manual to gastroc/soleus complex notes mm spasm in distal mm and tightness in tendon.  Slight relaxation with manual, therapist recommended pt to try heat to this area.  Exercises focused on improving DF only.    Examination-Activity Limitations  Bathing;Bed Mobility;Caring for Others;Carry;Dressing;Lift;Locomotion Level;Sit;Sleep;Squat;Stairs;Stand;Toileting;Transfers    Examination-Participation Restrictions  Church;Cleaning;Community Activity;Driving;Interpersonal Relationship;Laundry;Meal  Prep;Shop;Yard Work    Stability/Clinical Decision Making  Stable/Uncomplicated    Rehab Potential  Good    PT Frequency  3x / week    PT Duration  8 weeks    PT Treatment/Interventions  ADLs/Self Care Home Management;Cryotherapy;Electrical Stimulation;DME Instruction;Gait training;Stair training;Functional mobility training;Therapeutic activities;Therapeutic exercise;Balance training;Patient/family education;Manual techniques;Manual lymph drainage;Passive range of motion;Scar mobilization;Dry needling    PT Next Visit Plan  Continue to progress gait with no AD and improving dorsiflexion..    PT Home Exercise Plan  ROM, Circles, and isometrics for dorsiflexion/plantarflexion, ABC, towel crunches, sidelying in/eversion, prone DF/PF> 11/2 self mobilization with LAX ball to bottom of foot (or soup can)       Patient will benefit from skilled therapeutic intervention in order to improve the following deficits and impairments:  Abnormal gait, Decreased activity tolerance, Decreased balance, Decreased endurance, Decreased knowledge of use of DME, Decreased mobility, Decreased range of motion, Decreased strength, Difficulty walking, Increased edema, Increased fascial restricitons, Pain  Visit Diagnosis: Muscle weakness (generalized)  Other abnormalities of gait and mobility  Stiffness of right ankle, not elsewhere classified     Problem List Patient Active Problem List   Diagnosis Date Noted  . Closed right trimalleolar fracture, initial encounter 07/17/2019    Rayetta Humphrey, PT CLT [phone] 10/28/2019, 10:13 AM  [facility] [place], [place], [address] Phone: [phone]   Fax:  [phone]  Name: Adelheid Hoggard MRN: [id number] Date of Birth: December 31, 1965

## 2019-11-03 ENCOUNTER — Encounter (HOSPITAL_COMMUNITY): Payer: 59 | Admitting: Physical Therapy

## 2019-11-03 ENCOUNTER — Telehealth (HOSPITAL_COMMUNITY): Payer: Self-pay | Admitting: Physical Therapy

## 2019-11-03 NOTE — Telephone Encounter (Signed)
cancelled today's appt due to the weather

## 2019-11-05 ENCOUNTER — Telehealth (HOSPITAL_COMMUNITY): Payer: Self-pay | Admitting: Physical Therapy

## 2019-11-05 NOTE — Telephone Encounter (Signed)
pt called to cx this appt due to she will be out of town

## 2019-11-06 ENCOUNTER — Ambulatory Visit
Admission: RE | Admit: 2019-11-06 | Discharge: 2019-11-06 | Disposition: A | Discharge status: Home / Self Care | Payer: 59 | Source: Ambulatory Visit | Attending: Internal Medicine | Admitting: Internal Medicine

## 2019-11-06 ENCOUNTER — Other Ambulatory Visit: Payer: Self-pay

## 2019-11-06 DIAGNOSIS — Z13820 Encounter for screening for osteoporosis: Secondary | ICD-10-CM | POA: Diagnosis not present

## 2019-11-06 DIAGNOSIS — E2839 Other primary ovarian failure: Secondary | ICD-10-CM

## 2019-11-06 DIAGNOSIS — Z780 Asymptomatic menopausal state: Secondary | ICD-10-CM | POA: Diagnosis not present

## 2019-11-07 ENCOUNTER — Encounter (HOSPITAL_COMMUNITY): Payer: 59 | Admitting: Physical Therapy

## 2019-11-10 ENCOUNTER — Encounter (HOSPITAL_COMMUNITY): Payer: Self-pay | Admitting: Physical Therapy

## 2019-11-10 ENCOUNTER — Other Ambulatory Visit: Payer: Self-pay

## 2019-11-10 ENCOUNTER — Ambulatory Visit (HOSPITAL_COMMUNITY): Payer: 59 | Attending: Orthopedic Surgery | Admitting: Physical Therapy

## 2019-11-10 DIAGNOSIS — M6281 Muscle weakness (generalized): Secondary | ICD-10-CM | POA: Diagnosis not present

## 2019-11-10 DIAGNOSIS — M25671 Stiffness of right ankle, not elsewhere classified: Secondary | ICD-10-CM | POA: Diagnosis not present

## 2019-11-10 DIAGNOSIS — R2689 Other abnormalities of gait and mobility: Secondary | ICD-10-CM | POA: Insufficient documentation

## 2019-11-10 NOTE — Therapy (Signed)
[facility] [address] [place], [place], [address] Phone: [phone]   Fax:  [phone]  Physical Therapy Treatment/ Discharge Summary  Patient Details  Name: Laura [name] MRN: [id number] Date of Birth: [birth year] Referring Provider (PT): Victorino December   Encounter Date: 11/10/2019   PHYSICAL THERAPY DISCHARGE SUMMARY  Visits from Start of Care: 33  Current functional level related to goals / functional outcomes: See below   Remaining deficits: See below   Education / Equipment: See assessment  Plan: Patient agrees to discharge.  Patient goals were partially met. Patient is being discharged due to lack of progress.  ?????       PT End of Session - 11/10/19 1002    Visit Number  33    Number of Visits  34    Date for PT Re-Evaluation  11/08/19    Authorization Type  UMR; Cert: 5/40-->08/67; 61/95/09    Authorization Time Period  Progress note complete on 10/13/19 visit 71    Authorization - Visit Number  33    Authorization - Number of Visits  36    PT Start Time  0903    PT Stop Time  0950    PT Time Calculation (min)  47 min    Equipment Utilized During Treatment  --   1 axillary crutch   Activity Tolerance  Patient tolerated treatment well;No increased pain    Behavior During Therapy  WFL for tasks assessed/performed       Past Medical History:  Diagnosis Date  . Ankle fracture    Right  . ASCUS favor benign 2004   colposcopy negative  . Vertigo    on occasion     Past Surgical History:  Procedure Laterality Date  . ABDOMINAL HYSTERECTOMY  01/2010   Leiomyomata  . CESAREAN SECTION  1991   x1  . COLONOSCOPY N/A 09/03/2014   Procedure: COLONOSCOPY;  Surgeon: Rogene Houston, MD;  [facility];  Service: Endoscopy;  Laterality: N/A;  830  . COLPOSCOPY    . ORIF ANKLE FRACTURE Right 07/17/2019   Procedure: OPEN REDUCTION INTERNAL FIXATION (ORIF) ANKLE FRACTURE;  Surgeon: Nicholes Stairs, MD;  Location: [facility];  Service: Orthopedics;  Laterality: Right;  90 mins  . TUBAL LIGATION      There were no vitals filed for this visit.  Subjective Assessment - 11/10/19 0918    Subjective  Patient says she has been able to walk without her crutch more, but still feels that she is walking "funny". Patient says she is still having trouble walking stairs, and has continued to do her exercises 2 x daily. Patient says she was contacted by a representative about a [employer] brace but has not yet received her appointment details for fitting. Patient says she feels about 60% improvement since starting therapy.    Pertinent History  unremarkable    How long can you stand comfortably?  >20 minutes    How long can you walk comfortably?  >20 minutes    Patient Stated Goals  drive, walk without crutches or boot, bike    Currently in Pain?  No/denies    Pain Onset  More than a month ago    Pain Frequency  Intermittent    Aggravating Factors   walking    Pain Relieving Factors  rest         [facility] PT Assessment - 11/10/19 0001      Assessment   Medical  Diagnosis  RT ankle fx with ORIF     Referring Provider (PT)  Victorino December    Onset Date/Surgical Date  07/17/19      Precautions   Precautions  None      Restrictions   Weight Bearing Restrictions  No      Balance Screen   Has the patient fallen in the past 6 months  No      [place] residence      Prior Function   Level of Independence  Independent      Cognition   Overall Cognitive Status  Within Functional Limits for tasks assessed      Observation/Other Assessments   Focus on Therapeutic Outcomes (FOTO)   42% limited    was 59% limited     AROM   Right Ankle Dorsiflexion  0   was -5   Right Ankle Plantar Flexion  50    Right Ankle Inversion  25    Right Ankle Eversion  15    Left Ankle Dorsiflexion  8    Left Ankle Plantar Flexion  55    Left Ankle Inversion  30    Left Ankle  Eversion  25      Strength   Right Ankle Dorsiflexion  5/5    Right Ankle Plantar Flexion  5/5    Right Ankle Inversion  5/5    Right Ankle Eversion  5/5    Left Ankle Dorsiflexion  5/5    Left Ankle Plantar Flexion  5/5    Left Ankle Inversion  5/5    Left Ankle Eversion  5/5      Ambulation/Gait   Ambulation/Gait  Yes    Assistive device  None    Gait Pattern  Decreased stance time - right;Decreased step length - right    Ambulation Surface  Level      Balance   Balance Assessed  --   LT SLS: 45 sec; RT SLS: Unable without HHA x1                  [facility] Adult PT Treatment/Exercise - 11/10/19 0001      Knee/Hip Exercises: Standing   Stairs  3 x RT             PT Education - 11/10/19 1010    Education Details  Patient educated on reassessment findings, DC status, follow up with DME for [facility] brace, and updated HEP and frequency    Person(s) Educated  Patient    Methods  Explanation    Comprehension  Verbalized understanding       PT Short Term Goals - 11/10/19 0935      PT SHORT TERM GOAL #1   Title  PT ROM in Rt LE is wnl to allow normal gait    Baseline  Current: RT ankle AROM WNL except RT ankle DF: 0 degrees    Time  4    Period  Weeks    Status  Partially Met   all but DF   Target Date  11/07/19      PT SHORT TERM GOAL #2   Title  Pt to be ambulating without  assistive device and without walking boot    Baseline  Current: able to walk with no AD and no boot, but with noted gait deviation    Time  4    Period  Weeks    Status  Achieved  Target Date  11/07/19      PT SHORT TERM GOAL #3   Title  PT pain to be no greater than a 5/10 to allow pt to walk for up to 40 minutes at a time to be able to complete shopping/ housework activity.    Time  4    Period  Weeks    Status  Achieved        PT Long Term Goals - 11/10/19 0936      PT LONG TERM GOAL #1   Title  Pt strength of LE mm to be at least 4+/5 to allow pt to be able to go up  and down 2 flights of steps with one hand rail assist without increased pain.    Time  4    Period  Weeks    Status  Achieved      PT LONG TERM GOAL #2   Title  PT pain to be no greater than a 2/10 to allow pt to ambulate/ stand for 2 hours at a time to be ready for RTW duties.    Baseline  Patient reports being able to stand 45 min but is not sure about 2 hours; 11/10/19: patient has not tried standing/ walking longer than previous    Time  4    Period  Weeks    Status  Not Met      PT LONG TERM GOAL #3   Title  PT to be able to single leg stance on both LE for at least 45 seconds to allow pt to be confident walking  up/down hills on uneven terrain.    Baseline  Able to hold for 45 seconds but with 2 finger HHA x1    Time  4    Period  Weeks    Status  Partially Met            Plan - 11/10/19 1002    Clinical Impression Statement  Patient has made minimal improvement since last progress note. Patient did achieve 5 degrees of RT ankle dorsiflexion to neutral, but continues to be significantly limited with this direction, which has residual effect on functional mobility. Patient is able to ambulate with no AD, but noted gait deviation, early toe off of RT LE due to decreased dorsiflexion, but has significantly improved since staring therapy. Patient also able to ambulate stairs using reciprocal gait and single hand rail, but shows some degree of apprehension on occasion. At this point, patient is being discharged from therapy due to maximal benefit reached, and is to follow up with DME about [facility] brace fitting for improving RT ankle DF to [facility]. Patient was educated on and reviewed updated HEP, and instructed to follow up with physical therapy services with any further questions or concerns.    Examination-Activity Limitations  Bathing;Bed Mobility;Caring for Others;Carry;Dressing;Lift;Locomotion Level;Sit;Sleep;Squat;Stairs;Stand;Toileting;Transfers    Examination-Participation Restrictions   Church;Cleaning;Community Activity;Driving;Interpersonal Relationship;Laundry;Meal Prep;Shop;Yard Work    Stability/Clinical Decision Making  Stable/Uncomplicated    Rehab Potential  Good    PT Treatment/Interventions  ADLs/Self Care Home Management;Cryotherapy;Electrical Stimulation;DME Instruction;Gait training;Stair training;Functional mobility training;Therapeutic activities;Therapeutic exercise;Balance training;Patient/family education;Manual techniques;Manual lymph drainage;Passive range of motion;Scar mobilization;Dry needling    PT Next Visit Plan  DC    PT Home Exercise Plan  ROM, Circles, and isometrics for dorsiflexion/plantarflexion, ABC, towel crunches, sidelying in/eversion, prone DF/PF> 11/2 self mobilization with LAX ball to bottom of foot (or soup can)       Patient will benefit from skilled therapeutic intervention  in order to improve the following deficits and impairments:  Abnormal gait, Decreased activity tolerance, Decreased balance, Decreased endurance, Decreased knowledge of use of DME, Decreased mobility, Decreased range of motion, Decreased strength, Difficulty walking, Increased edema, Increased fascial restricitons, Pain  Visit Diagnosis: Muscle weakness (generalized)  Other abnormalities of gait and mobility  Stiffness of right ankle, not elsewhere classified     Problem List Patient Active Problem List   Diagnosis Date Noted  . Closed right trimalleolar fracture, initial encounter 07/17/2019    10:24 AM, 11/10/19 Josue Hector PT DPT  Physical Therapist with [facility]  [phone] [place] [address] [place], [place], [address] Phone: [phone]   Fax:  [phone]  Name: Yuri Flener MRN: [id number] Date of Birth: 22-Dec-1965

## 2019-11-11 DIAGNOSIS — S82851D Displaced trimalleolar fracture of right lower leg, subsequent encounter for closed fracture with routine healing: Secondary | ICD-10-CM | POA: Diagnosis not present

## 2019-11-13 ENCOUNTER — Encounter (HOSPITAL_COMMUNITY): Payer: 59 | Admitting: Physical Therapy

## 2019-11-13 DIAGNOSIS — Z96661 Presence of right artificial ankle joint: Secondary | ICD-10-CM | POA: Diagnosis not present

## 2019-11-14 ENCOUNTER — Encounter (HOSPITAL_COMMUNITY): Payer: 59 | Admitting: Physical Therapy

## 2019-11-17 ENCOUNTER — Encounter (HOSPITAL_COMMUNITY): Payer: 59 | Admitting: Physical Therapy

## 2019-11-18 ENCOUNTER — Telehealth (HOSPITAL_COMMUNITY): Payer: Self-pay | Admitting: Physical Therapy

## 2019-11-18 NOTE — Telephone Encounter (Signed)
L/m as promised to let patient know after s/w PT we can get her on the schedule and we need her to have the jasbrace first. Requested patient call us back to schedule once she has the [place].

## 2019-11-20 ENCOUNTER — Encounter (HOSPITAL_COMMUNITY): Payer: 59 | Admitting: Physical Therapy

## 2019-11-21 ENCOUNTER — Encounter (HOSPITAL_COMMUNITY): Payer: 59 | Admitting: Physical Therapy

## 2019-12-08 ENCOUNTER — Telehealth (HOSPITAL_COMMUNITY): Payer: Self-pay | Admitting: Physical Therapy

## 2019-12-08 ENCOUNTER — Ambulatory Visit (HOSPITAL_COMMUNITY): Payer: 59 | Admitting: Physical Therapy

## 2019-12-08 NOTE — Telephone Encounter (Signed)
Cx dueCx due to husband waiting on covid test results and patient understands she has to be tested as well. R/s for Firday  to husband waiting on covid test results and patient understands she has to be tested as well. R/s for Firday

## 2019-12-12 ENCOUNTER — Ambulatory Visit (HOSPITAL_COMMUNITY): Payer: 59 | Attending: Orthopedic Surgery | Admitting: Physical Therapy

## 2019-12-12 ENCOUNTER — Other Ambulatory Visit: Payer: Self-pay

## 2019-12-12 DIAGNOSIS — M6281 Muscle weakness (generalized): Secondary | ICD-10-CM | POA: Insufficient documentation

## 2019-12-12 DIAGNOSIS — M25671 Stiffness of right ankle, not elsewhere classified: Secondary | ICD-10-CM | POA: Insufficient documentation

## 2019-12-12 DIAGNOSIS — R2689 Other abnormalities of gait and mobility: Secondary | ICD-10-CM | POA: Diagnosis not present

## 2019-12-12 DIAGNOSIS — M25571 Pain in right ankle and joints of right foot: Secondary | ICD-10-CM | POA: Insufficient documentation
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
## 2019-12-15 ENCOUNTER — Telehealth (HOSPITAL_COMMUNITY): Payer: Self-pay | Admitting: Physical Therapy

## 2019-12-15 NOTE — Telephone Encounter (Signed)
Pt called back she doesn't want to see Jonni Sanger and will wait to see CR on Thursday as scheduled.

## 2019-12-15 NOTE — Telephone Encounter (Signed)
L/m to cx Wed 1/13 and offered Tuesday @ 8:15am with Jonni Sanger. Requested return phone call from patient. NF

## 2019-12-17 ENCOUNTER — Ambulatory Visit (HOSPITAL_COMMUNITY): Payer: 59 | Admitting: Physical Therapy

## 2019-12-18 ENCOUNTER — Ambulatory Visit (HOSPITAL_COMMUNITY): Payer: 59 | Admitting: Physical Therapy

## 2019-12-22 ENCOUNTER — Telehealth (HOSPITAL_COMMUNITY): Payer: Self-pay | Admitting: Physical Therapy

## 2019-12-22 NOTE — Telephone Encounter (Signed)
[name] to let pt know she will be with Laura [name] today and 1/21, requested that she call back if she wanted to change this. NF 12/22/2019

## 2019-12-24 ENCOUNTER — Ambulatory Visit (HOSPITAL_COMMUNITY): Payer: 59 | Admitting: Physical Therapy

## 2019-12-24 ENCOUNTER — Other Ambulatory Visit: Payer: Self-pay

## 2019-12-24 ENCOUNTER — Encounter (HOSPITAL_COMMUNITY): Payer: 59 | Admitting: Physical Therapy

## 2019-12-24 DIAGNOSIS — M25671 Stiffness of right ankle, not elsewhere classified: Secondary | ICD-10-CM | POA: Diagnosis not present

## 2019-12-24 DIAGNOSIS — R2689 Other abnormalities of gait and mobility: Secondary | ICD-10-CM

## 2019-12-24 DIAGNOSIS — M25571 Pain in right ankle and joints of right foot: Secondary | ICD-10-CM | POA: Diagnosis not present

## 2019-12-24 DIAGNOSIS — M6281 Muscle weakness (generalized): Secondary | ICD-10-CM | POA: Diagnosis not present

## 2019-12-24 NOTE — Therapy (Signed)
[place], [place], [address] Phone: [phone]   Fax:  [phone]  Physical Therapy Treatment  Patient Details  Name: Laura [name] MRN: [id number] Date of Birth: October 05, 1966 Referring Provider (PT): Victorino December   Encounter Date: 12/24/2019  PT End of Session - 12/24/19 1155    Visit Number  2    Number of Visits  8    Date for PT Re-Evaluation  01/09/20    Authorization Type  UMR    Authorization - Visit Number  2    Authorization - Number of Visits  8    PT Start Time  [id number]    PT Stop Time  0915    PT Time Calculation (min)  40 min       Past Medical History:  Diagnosis Date  . Ankle fracture    Right  . ASCUS favor benign 2004   colposcopy negative  . Vertigo    on occasion     Past Surgical History:  Procedure Laterality Date  . ABDOMINAL HYSTERECTOMY  01/2010   Leiomyomata  . CESAREAN SECTION  1991   x1  . COLONOSCOPY N/A 09/03/2014   Procedure: COLONOSCOPY;  Surgeon: Rogene Houston, MD;  [facility];  Service: Endoscopy;  Laterality: N/A;  830  . COLPOSCOPY    . ORIF ANKLE FRACTURE Right 07/17/2019   Procedure: OPEN REDUCTION INTERNAL FIXATION (ORIF) ANKLE FRACTURE;  Surgeon: Nicholes Stairs, MD;  Location: [facility];  Service: Orthopedics;  Laterality: Right;  90 mins  . TUBAL LIGATION      There were no vitals filed for this visit.  Subjective Assessment - 12/24/19 0853    Subjective  pt states" I'm only here for deep tissue, not exercises" upon entrance into the gym area.  States she still has not done any exercises at home but continues to use her JAS 3X daily.  no pain currently only c/o swelling.                       [facility] Adult PT Treatment/Exercise - 12/24/19 0001      Knee/Hip Exercises: Plyometrics   Broad Jump  3 sets    Broad Jump Limitations  max of 79cm (going by Lt LE)      Knee/Hip Exercises: Standing   Heel Raises  15 reps    Heel Raises Limitations  up on both,shift and lower to RT x 5     SLS  on foam 30" X 2 with intermittent UE assist    SLS with Vectors  Rt 5X5" holds with 1 UE assist    Other Standing Knee Exercises  heelwalk, toewalk 2RT each      Manual Therapy   Manual Therapy  Soft tissue mobilization;Passive ROM;Myofascial release    Manual therapy comments  done seperate from all other aspects of treatment.     Soft tissue mobilization  to reduce tightness /mm spasms    Myofascial Release  to reduce adhesions in achilles and lateral ankle    Passive ROM  to DF             PT Education - 12/24/19 0941    Education Details  reviewed HEP, goals and POC moving forward    Person(s) Educated  Patient    Methods  Explanation;Demonstration;Tactile cues;Verbal cues    Comprehension  Verbalized understanding;Returned demonstration;Verbal cues required;Tactile cues required       PT  Short Term Goals - 12/24/19 0942      PT SHORT TERM GOAL #1   Title  PT RT Dorsiflexion to be 0 degrees to assist in normalizing gait    Baseline  -3    Time  4    Period  Weeks    Status  On-going   all but DF   Target Date  12/26/19      PT SHORT TERM GOAL #2   Title  PT to be able to complete one single leg heel raise on the Right to demonstrated improved strength to be able to go up and down steps    Time  2    Period  Weeks    Status  On-going   10/13: crutches but no boot.     PT SHORT TERM GOAL #3   Period  Weeks    Status  Achieved        PT Long Term Goals - 12/24/19 0943      PT LONG TERM GOAL #1   Title  PT to be able to DF RT foot to +2 to allow improved gait    Time  4    Period  Weeks    Status  On-going      PT LONG TERM GOAL #2   Title  PT to be able to single leg stance on RT LE for 10 seconds to decrease risk of falling    Time  4    Period  Weeks    Status  On-going      PT LONG TERM GOAL #3   Title  PT to be able to verbalize and be comfortable that her ankle may be  swelling for up to 6 months longer    Time  4    Period  Weeks    Status  On-going            Plan - 12/24/19 1156    Clinical Impression Statement  Reveiwed evaluation findings and POC with patient.  Explained her deficits in ROM, strength and gait and those are her skilled needs for acquiring therapy.  Explained if she would only like massage she could get that from a non-skilled venue and that we could not see her for only that reason.  Pt verbalized understanding.  Completed bilateral LE broad jump with max distance of 79cm, unable to complete singleleg at this time.  Added heel and toe ambuation with toe walking being more challenging, tandem, SLS and vectors.  Pt with general tightness on either side of ankle and up achilles tendon to relieve tightness.  Several areas of spasm palpated and resolved as well with manual techniques.  PROM for DF also completed in supine.  Pt tolerated all well without complaints of pain.    Examination-Activity Limitations  Bathing;Bed Mobility;Caring for Others;Carry;Dressing;Lift;Locomotion Level;Sit;Sleep;Squat;Stairs;Stand;Toileting;Transfers    Examination-Participation Restrictions  Church;Cleaning;Community Activity;Driving;Interpersonal Relationship;Laundry;Meal Prep;Shop;Yard Work    Stability/Clinical Decision Making  Stable/Uncomplicated    Rehab Potential  Good    PT Treatment/Interventions  ADLs/Self Care Home Management;Cryotherapy;Electrical Stimulation;DME Instruction;Gait training;Stair training;Functional mobility training;Therapeutic activities;Therapeutic exercise;Balance training;Patient/family education;Manual techniques;Manual lymph drainage;Passive range of motion;Scar mobilization;Dry needling    PT Next Visit Plan  progress activity to improve stability, strength and ROM.  Begin crouch and return from crouch position when able.    PT Home Exercise Plan  Raise on both shift to RT and lower on RT only       Patient will benefit from  skilled therapeutic  intervention in order to improve the following deficits and impairments:  Abnormal gait, Decreased activity tolerance, Decreased balance, Decreased endurance, Decreased knowledge of use of DME, Decreased mobility, Decreased range of motion, Decreased strength, Difficulty walking, Increased edema, Increased fascial restricitons, Pain  Visit Diagnosis: Muscle weakness (generalized)  Other abnormalities of gait and mobility  Stiffness of right ankle, not elsewhere classified     Problem List Patient Active Problem List   Diagnosis Date Noted  . Closed right trimalleolar fracture, initial encounter 07/17/2019   Teena Irani, PTA/CLT [phone]  Teena Irani 12/24/2019, 12:11 PM  [facility] [address] [place], [place], [address] Phone: [phone]   Fax:  [phone]  Name: Laura [name] MRN: [id number] Date of Birth: 05-Nov-1966

## 2019-12-25 ENCOUNTER — Ambulatory Visit (HOSPITAL_COMMUNITY): Payer: 59 | Admitting: Physical Therapy

## 2019-12-25 ENCOUNTER — Encounter (HOSPITAL_COMMUNITY): Payer: 59 | Admitting: Physical Therapy

## 2019-12-25 ENCOUNTER — Other Ambulatory Visit: Payer: Self-pay

## 2019-12-25 DIAGNOSIS — M25571 Pain in right ankle and joints of right foot: Secondary | ICD-10-CM

## 2019-12-25 DIAGNOSIS — M6281 Muscle weakness (generalized): Secondary | ICD-10-CM | POA: Diagnosis not present

## 2019-12-25 DIAGNOSIS — R2689 Other abnormalities of gait and mobility: Secondary | ICD-10-CM

## 2019-12-25 DIAGNOSIS — M25671 Stiffness of right ankle, not elsewhere classified: Secondary | ICD-10-CM | POA: Diagnosis not present

## 2019-12-25 NOTE — Therapy (Signed)
[facility] [place], [place], [address] Phone: [phone]   Fax:  [phone]  Physical Therapy Treatment  Patient Details  Name: Laura [name] MRN: [id number] Date of Birth: [birth year] Referring Provider (PT): Victorino December   Encounter Date: 12/25/2019  PT End of Session - 12/25/19 1614    Visit Number  3    Number of Visits  8    Date for PT Re-Evaluation  01/09/20    Authorization Type  UMR    Authorization - Visit Number  3    Authorization - Number of Visits  8    PT Start Time  [id number]    PT Stop Time  1450    PT Time Calculation (min)  42 min       Past Medical History:  Diagnosis Date  . Ankle fracture    Right  . ASCUS favor benign 2004   colposcopy negative  . Vertigo    on occasion     Past Surgical History:  Procedure Laterality Date  . ABDOMINAL HYSTERECTOMY  01/2010   Leiomyomata  . CESAREAN SECTION  1991   x1  . COLONOSCOPY N/A 09/03/2014   Procedure: COLONOSCOPY;  Surgeon: Rogene Houston, MD;  [facility];  Service: Endoscopy;  Laterality: N/A;  830  . COLPOSCOPY    . ORIF ANKLE FRACTURE Right 07/17/2019   Procedure: OPEN REDUCTION INTERNAL FIXATION (ORIF) ANKLE FRACTURE;  Surgeon: Nicholes Stairs, MD;  Location: [facility];  Service: Orthopedics;  Laterality: Right;  90 mins  . TUBAL LIGATION      There were no vitals filed for this visit.  Subjective Assessment - 12/25/19 1407    Subjective  pt states she is swollen today, driving makes it swell some.  STates it feels better when she's standing but currently no pain.    Currently in Pain?  No/denies    Pain Score  0-No pain                       [facility] Adult PT Treatment/Exercise - 12/25/19 0001      Knee/Hip Exercises: Stretches   Gastroc Stretch  Right;3 reps;30 seconds    Gastroc Stretch Limitations  slant board      Knee/Hip Exercises: Plyometrics   Other Plyometric Exercises  pumping up and  down (jumping without feet leaving ground)      Knee/Hip Exercises: Standing   Heel Raises  Right;Limitations;2 sets;5 reps    Heel Raises Limitations  Lt toe tap, 1 UE assist    SLS  on foam 30" X 5 with intermittent UE assist    SLS with Vectors  Rt 2 sets 5X5"  holds with 1 UE assist    Other Standing Knee Exercises  heelwalk, toewalk 2RT each    Other Standing Knee Exercises  crouch to 8" box/ sit / stand 5X               PT Short Term Goals - 12/24/19 [id number]      PT SHORT TERM GOAL #1   Title  PT RT Dorsiflexion to be 0 degrees to assist in normalizing gait    Baseline  -3    Time  4    Period  Weeks    Status  On-going   all but DF   Target Date  12/26/19      PT SHORT TERM GOAL #2   Title  PT to  be able to complete one single leg heel raise on the Right to demonstrated improved strength to be able to go up and down steps    Time  2    Period  Weeks    Status  On-going   10/13: crutches but no boot.     PT SHORT TERM GOAL #3   Period  Weeks    Status  Achieved        PT Long Term Goals - 12/24/19 0943      PT LONG TERM GOAL #1   Title  PT to be able to DF RT foot to +2 to allow improved gait    Time  4    Period  Weeks    Status  On-going      PT LONG TERM GOAL #2   Title  PT to be able to single leg stance on RT LE for 10 seconds to decrease risk of falling    Time  4    Period  Weeks    Status  On-going      PT LONG TERM GOAL #3   Title  PT to be able to verbalize and be comfortable that her ankle may be swelling for up to 6 months longer    Time  4    Period  Weeks    Status  On-going            Plan - 12/25/19 1617    Clinical Impression Statement  continued with progression of therex.  Added foam to SLS actvitiy with intermittent fingertip assist in 30 second intervals.  Able to complete single leg heelraise with 50% use of 1 UE today and opposite toe down on floor.  Began crouching to 8" box with improving form with each repeieiton.   Began with Rt heel leaving groud when both ascending and descending and by second set was abl to keep foot flat on ground.  Pt did not report any pain.  Therapist did not observe and swelling today and only minimal tightness with manual around the ankle joint.    Examination-Activity Limitations  Bathing;Bed Mobility;Caring for Others;Carry;Dressing;Lift;Locomotion Level;Sit;Sleep;Squat;Stairs;Stand;Toileting;Transfers    Examination-Participation Restrictions  Church;Cleaning;Community Activity;Driving;Interpersonal Relationship;Laundry;Meal Prep;Shop;Yard Work    Stability/Clinical Decision Making  Stable/Uncomplicated    Rehab Potential  Good    PT Treatment/Interventions  ADLs/Self Care Home Management;Cryotherapy;Electrical Stimulation;DME Instruction;Gait training;Stair training;Functional mobility training;Therapeutic activities;Therapeutic exercise;Balance training;Patient/family education;Manual techniques;Manual lymph drainage;Passive range of motion;Scar mobilization;Dry needling    PT Next Visit Plan  progress activity to improve stability, strength and ROM.  Progress to full crouch as able.    PT Home Exercise Plan  Raise on both shift to RT and lower on RT only       Patient will benefit from skilled therapeutic intervention in order to improve the following deficits and impairments:  Abnormal gait, Decreased activity tolerance, Decreased balance, Decreased endurance, Decreased knowledge of use of DME, Decreased mobility, Decreased range of motion, Decreased strength, Difficulty walking, Increased edema, Increased fascial restricitons, Pain  Visit Diagnosis: Muscle weakness (generalized)  Other abnormalities of gait and mobility  Stiffness of right ankle, not elsewhere classified  Pain in right ankle and joints of right foot     Problem List Patient Active Problem List   Diagnosis Date Noted  . Closed right trimalleolar fracture, initial encounter 07/17/2019   Teena Irani, PTA/CLT [phone]  Teena Irani 12/25/2019, 4:23 PM  [facility] [address]  [place], [place], [address] Phone: [phone]   Fax:  [phone]  Name: Shatari Moorman MRN: [id number] Date of Birth: December 12, 1965

## 2019-12-30 ENCOUNTER — Ambulatory Visit (HOSPITAL_COMMUNITY): Payer: 59 | Admitting: Physical Therapy

## 2019-12-30 ENCOUNTER — Other Ambulatory Visit: Payer: Self-pay

## 2019-12-30 ENCOUNTER — Encounter (HOSPITAL_COMMUNITY): Payer: Self-pay | Admitting: Physical Therapy

## 2019-12-30 DIAGNOSIS — M25571 Pain in right ankle and joints of right foot: Secondary | ICD-10-CM | POA: Diagnosis not present

## 2019-12-30 DIAGNOSIS — R2689 Other abnormalities of gait and mobility: Secondary | ICD-10-CM | POA: Diagnosis not present

## 2019-12-30 DIAGNOSIS — M25671 Stiffness of right ankle, not elsewhere classified: Secondary | ICD-10-CM

## 2019-12-30 DIAGNOSIS — M6281 Muscle weakness (generalized): Secondary | ICD-10-CM

## 2019-12-30 NOTE — Therapy (Signed)
[place], [place], [address] Phone: [phone]   Fax:  [phone]  Physical Therapy Treatment  Patient Details  Name: Laura [name] MRN: [id number] Date of Birth: [birth year] Referring Provider (PT): Victorino December   Encounter Date: 12/30/2019  PT End of Session - 12/30/19 1016    Visit Number  4    Number of Visits  8    Date for PT Re-Evaluation  01/09/20    Authorization Type  UMR    Authorization - Visit Number  4    Authorization - Number of Visits  8    PT Start Time  0920    PT Stop Time  9935    PT Time Calculation (min)  35 min    Activity Tolerance  Patient tolerated treatment well       Past Medical History:  Diagnosis Date  . Ankle fracture    Right  . ASCUS favor benign 2004   colposcopy negative  . Vertigo    on occasion     Past Surgical History:  Procedure Laterality Date  . ABDOMINAL HYSTERECTOMY  01/2010   Leiomyomata  . CESAREAN SECTION  1991   x1  . COLONOSCOPY N/A 09/03/2014   Procedure: COLONOSCOPY;  Surgeon: Rogene Houston, MD;  [facility];  Service: Endoscopy;  Laterality: N/A;  830  . COLPOSCOPY    . ORIF ANKLE FRACTURE Right 07/17/2019   Procedure: OPEN REDUCTION INTERNAL FIXATION (ORIF) ANKLE FRACTURE;  Surgeon: Nicholes Stairs, MD;  Location: [facility];  Service: Orthopedics;  Laterality: Right;  90 mins  . TUBAL LIGATION      There were no vitals filed for this visit.  Subjective Assessment - 12/30/19 1011    Subjective  PT states that she is doing her exercises.  She really thinks when she quit she lost ground.    Currently in Pain?  No/denies           [facility] Adult PT Treatment/Exercise - 12/30/19 0001      Exercises   Exercises  Ankle      Knee/Hip Exercises: Standing   Heel Raises  20 reps;Other (comment)    Heel Raises Limitations  No UE assist    Forward Lunges  Both;10 reps    Forward Lunges Limitations  Rows with lunging  forward, have tband above ankle on Rt resisting forward motion     Other Standing Knee Exercises  back against counter to keep good alignment toe raises x 15;     Other Standing Knee Exercises  crouch to ground in corner and raise up without UE assist x 5       Manual Therapy   Manual Therapy  Soft tissue mobilization;Passive ROM;Myofascial release    Manual therapy comments  done seperate from all other aspects of treatment.     Soft tissue mobilization  posterior ankle to decrease swelling; gastroc mm to decrease tightness     Passive ROM  to DF               PT Short Term Goals - 12/24/19 0942      PT SHORT TERM GOAL #1   Title  PT RT Dorsiflexion to be 0 degrees to assist in normalizing gait    Baseline  -3    Time  4    Period  Weeks    Status met   Target Date  12/26/19      PT  SHORT TERM GOAL #2   Title  PT to be able to complete one single leg heel raise on the Right to demonstrated improved strength to be able to go up and down steps    Time  2    Period  Weeks    Status  On-going   10/13: crutches but no boot.     PT SHORT TERM GOAL #3   Period  Weeks    Status  Achieved        PT Long Term Goals - 12/24/19 0943      PT LONG TERM GOAL #1   Title  PT to be able to DF RT foot to +2 to allow improved gait    Time  4    Period  Weeks    Status met     PT LONG TERM GOAL #2   Title  PT to be able to single leg stance on RT LE for 10 seconds to decrease risk of falling    Time  4    Period  Weeks    Status  On-going      PT LONG TERM GOAL #3   Title  PT to be able to verbalize and be comfortable that her ankle may be swelling for up to 6 months longer    Time  4    Period  Weeks    Status  On-going            Plan - 12/30/19 1017    Clinical Impression Statement  Pt ROM tested at end of treatment.  PT ROM is at 3; evaluation was -5.  Pt is improving in strength as well but will continut to need skilled PT to optimize ROM to 8 degrees for full  function and optimize gastroc strength to at least 4/5    Examination-Activity Limitations  Bathing;Bed Mobility;Caring for Others;Carry;Dressing;Lift;Locomotion Level;Sit;Sleep;Squat;Stairs;Stand;Toileting;Transfers    Examination-Participation Restrictions  Church;Cleaning;Community Activity;Driving;Interpersonal Relationship;Laundry;Meal Prep;Shop;Yard Work    Stability/Clinical Decision Making  Stable/Uncomplicated    Rehab Potential  Good    PT Treatment/Interventions  ADLs/Self Care Home Management;Cryotherapy;Electrical Stimulation;DME Instruction;Gait training;Stair training;Functional mobility training;Therapeutic activities;Therapeutic exercise;Balance training;Patient/family education;Manual techniques;Manual lymph drainage;Passive range of motion;Scar mobilization;Dry needling    PT Next Visit Plan  progress activity to improve stability, strength and ROM.  Progress to full crouch as able.    PT Home Exercise Plan  Raise on both shift to RT and lower on RT only       Patient will benefit from skilled therapeutic intervention in order to improve the following deficits and impairments:  Abnormal gait, Decreased activity tolerance, Decreased balance, Decreased endurance, Decreased knowledge of use of DME, Decreased mobility, Decreased range of motion, Decreased strength, Difficulty walking, Increased edema, Increased fascial restricitons, Pain  Visit Diagnosis: Muscle weakness (generalized)  Other abnormalities of gait and mobility  Stiffness of right ankle, not elsewhere classified     Problem List Patient Active Problem List   Diagnosis Date Noted  . Closed right trimalleolar fracture, initial encounter 07/17/2019   Rayetta Humphrey, PT CLT [phone] 12/30/2019, 10:20 AM  [place], [place], [address] Phone: [phone]   Fax:  [phone]  Name: Declyn Offield MRN: [id number] Date of Birth:  [birth year]

## 2020-01-01 ENCOUNTER — Encounter (HOSPITAL_COMMUNITY): Payer: Self-pay | Admitting: Physical Therapy

## 2020-01-01 ENCOUNTER — Other Ambulatory Visit: Payer: Self-pay

## 2020-01-01 ENCOUNTER — Ambulatory Visit (HOSPITAL_COMMUNITY): Payer: 59 | Admitting: Physical Therapy

## 2020-01-01 DIAGNOSIS — R2689 Other abnormalities of gait and mobility: Secondary | ICD-10-CM | POA: Diagnosis not present

## 2020-01-01 DIAGNOSIS — M25571 Pain in right ankle and joints of right foot: Secondary | ICD-10-CM | POA: Diagnosis not present

## 2020-01-01 DIAGNOSIS — M25671 Stiffness of right ankle, not elsewhere classified: Secondary | ICD-10-CM | POA: Diagnosis not present

## 2020-01-01 DIAGNOSIS — M6281 Muscle weakness (generalized): Secondary | ICD-10-CM

## 2020-01-01 NOTE — Therapy (Signed)
[place], [place], [address] Phone: [phone]   Fax:  [phone]  Physical Therapy Treatment  Patient Details  Name: Laura [name] MRN: [id number] Date of Birth: [birth year] Referring Provider (PT): Victorino December   Encounter Date: 01/01/2020  PT End of Session - 01/01/20 0926    Visit Number  5    Number of Visits  8    Date for PT Re-Evaluation  01/09/20    Authorization Type  UMR    Authorization - Visit Number  4    Authorization - Number of Visits  8    PT Start Time  [phone]    PT Stop Time  0915    PT Time Calculation (min)  43 min    Activity Tolerance  Patient tolerated treatment well       Past Medical History:  Diagnosis Date  . Ankle fracture    Right  . ASCUS favor benign 2004   colposcopy negative  . Vertigo    on occasion     Past Surgical History:  Procedure Laterality Date  . ABDOMINAL HYSTERECTOMY  01/2010   Leiomyomata  . CESAREAN SECTION  1991   x1  . COLONOSCOPY N/A 09/03/2014   Procedure: COLONOSCOPY;  Surgeon: Rogene Houston, MD;  [facility];  Service: Endoscopy;  Laterality: N/A;  830  . COLPOSCOPY    . ORIF ANKLE FRACTURE Right 07/17/2019   Procedure: OPEN REDUCTION INTERNAL FIXATION (ORIF) ANKLE FRACTURE;  Surgeon: Nicholes Stairs, MD;  Location: [facility];  Service: Orthopedics;  Laterality: Right;  90 mins  . TUBAL LIGATION      There were no vitals filed for this visit.  Subjective Assessment - 01/01/20 0834    Subjective  Pt states that she began the circuit training at work.  Her ankle is swollen    Pertinent History  unremarkable    How long can you sit comfortably?  no problem    How long can you stand comfortably?  30 minutes    How long can you walk comfortably?  40 minutes    Patient Stated Goals  drive, walk without crutches or boot, bike    Pain Onset  More than a month ago                       [facility] Adult PT  Treatment/Exercise - 01/01/20 0001      Ambulation/Gait   Ambulation/Gait  Yes    Gait Comments  speed walking 2 laps around department      Exercises   Exercises  Ankle      Knee/Hip Exercises: Plyometrics   Other Plyometric Exercises  pumping up and down (jumping without feet leaving ground)      Knee/Hip Exercises: Standing   Heel Raises  --    Heel Raises Limitations  up on both lift LT hold 15" x 10     Forward Lunges  Both;10 reps    Forward Lunges Limitations  Rows with lunging forward, have tband above ankle on Rt resisting forward motion     SLS  foam x 60"     Other Standing Knee Exercises  back against counter to keep good alignment toe raises x 15;     Other Standing Knee Exercises  crouch to ground in corner and raise up without UE assist x 7       Manual Therapy   Manual Therapy  Soft tissue mobilization;Passive ROM;Myofascial release    Manual therapy comments  done seperate from all other aspects of treatment.     Soft tissue mobilization  posterior ankle to decrease swelling; gastroc mm to decrease tightness     Passive ROM  to DF               PT Short Term Goals - 12/30/19 1021      PT SHORT TERM GOAL #1   Title  PT RT Dorsiflexion to be 0 degrees to assist in normalizing gait    Baseline  -3    Time  4    Period  Weeks    Status  Achieved    Target Date  12/26/19      PT SHORT TERM GOAL #2   Title  PT to be able to complete one single leg heel raise on the Right to demonstrated improved strength to be able to go up and down steps    Time  2    Period  Weeks    Status  On-going   10/13: crutches but no boot.     PT SHORT TERM GOAL #3   Period  Weeks    Status  Achieved        PT Long Term Goals - 12/30/19 1022      PT LONG TERM GOAL #1   Title  PT to be able to DF RT foot to +2 to allow improved gait    Time  4    Period  Weeks    Status  Achieved      PT LONG TERM GOAL #2   Title  PT to be able to single leg stance on RT LE for  10 seconds to decrease risk of falling    Time  4    Period  Weeks    Status  On-going      PT LONG TERM GOAL #3   Title  PT to be able to verbalize and be comfortable that her ankle may be swelling for up to 6 months longer    Time  4    Period  Weeks    Status  On-going            Plan - 01/01/20 0926    Clinical Impression Statement  Pt tolerated speed walking well, Noted scar tissue and edema prosterior to medial malleolus, Pt instructed to add 30 seconds of calf pumping into circuit training that she is completing at work.    Examination-Activity Limitations  Bathing;Bed Mobility;Caring for Others;Carry;Dressing;Lift;Locomotion Level;Sit;Sleep;Squat;Stairs;Stand;Toileting;Transfers    Examination-Participation Restrictions  Church;Cleaning;Community Activity;Driving;Interpersonal Relationship;Laundry;Meal Prep;Shop;Yard Work    Stability/Clinical Decision Making  Stable/Uncomplicated    Rehab Potential  Good    PT Treatment/Interventions  ADLs/Self Care Home Management;Cryotherapy;Electrical Stimulation;DME Instruction;Gait training;Stair training;Functional mobility training;Therapeutic activities;Therapeutic exercise;Balance training;Patient/family education;Manual techniques;Manual lymph drainage;Passive range of motion;Scar mobilization;Dry needling    PT Next Visit Plan  progress activity to improve stability, strength and ROM.  Progress to full crouch as able.    PT Home Exercise Plan  Raise on both shift to RT and lower on RT only       Patient will benefit from skilled therapeutic intervention in order to improve the following deficits and impairments:  Abnormal gait, Decreased activity tolerance, Decreased balance, Decreased endurance, Decreased knowledge of use of DME, Decreased mobility, Decreased range of motion, Decreased strength, Difficulty walking, Increased edema, Increased fascial restricitons, Pain  Visit Diagnosis: Muscle weakness (generalized)  Other  abnormalities of gait and mobility  Stiffness of right ankle, not elsewhere classified     Problem List Patient Active Problem List   Diagnosis Date Noted  . Closed right trimalleolar fracture, initial encounter 07/17/2019    Rayetta Humphrey, PT CLT [phone] 01/01/2020, 9:28 AM  [facility] [address] [place], [place], [address] Phone: [phone]   Fax:  [phone]  Name: Laura [name] MRN: [id number] Date of Birth: Jun 07, 1966

## 2020-01-06 ENCOUNTER — Other Ambulatory Visit: Payer: Self-pay

## 2020-01-06 ENCOUNTER — Ambulatory Visit (HOSPITAL_COMMUNITY): Payer: 59 | Attending: Orthopedic Surgery | Admitting: Physical Therapy

## 2020-01-06 DIAGNOSIS — M6281 Muscle weakness (generalized): Secondary | ICD-10-CM | POA: Diagnosis not present

## 2020-01-06 DIAGNOSIS — M25571 Pain in right ankle and joints of right foot: Secondary | ICD-10-CM | POA: Diagnosis not present

## 2020-01-06 DIAGNOSIS — R2689 Other abnormalities of gait and mobility: Secondary | ICD-10-CM | POA: Insufficient documentation

## 2020-01-06 DIAGNOSIS — M25671 Stiffness of right ankle, not elsewhere classified: Secondary | ICD-10-CM | POA: Insufficient documentation

## 2020-01-06 NOTE — Therapy (Signed)
[place], [place], [address] Phone: [phone]   Fax:  [phone]  Physical Therapy Treatment  Patient Details  Name: Laura [name] MRN: [id number] Date of Birth: 1966/09/21 Referring Provider (PT): Victorino December   Encounter Date: 01/06/2020  PT End of Session - 01/06/20 1043    Visit Number  6    Number of Visits  8    Date for PT Re-Evaluation  01/09/20    Authorization Type  UMR    Authorization - Visit Number  6    Authorization - Number of Visits  8    PT Start Time  1000    PT Stop Time  1040    PT Time Calculation (min)  40 min    Activity Tolerance  Patient tolerated treatment well       Past Medical History:  Diagnosis Date  . Ankle fracture    Right  . ASCUS favor benign 2004   colposcopy negative  . Vertigo    on occasion     Past Surgical History:  Procedure Laterality Date  . ABDOMINAL HYSTERECTOMY  01/2010   Leiomyomata  . CESAREAN SECTION  1991   x1  . COLONOSCOPY N/A 09/03/2014   Procedure: COLONOSCOPY;  Surgeon: Rogene Houston, MD;  [facility];  Service: Endoscopy;  Laterality: N/A;  830  . COLPOSCOPY    . ORIF ANKLE FRACTURE Right 07/17/2019   Procedure: OPEN REDUCTION INTERNAL FIXATION (ORIF) ANKLE FRACTURE;  Surgeon: Nicholes Stairs, MD;  Location: [facility];  Service: Orthopedics;  Laterality: Right;  90 mins  . TUBAL LIGATION      There were no vitals filed for this visit.  Subjective Assessment - 01/06/20 1007    Subjective  Pt has been using the TM at work but at 1.5    Pertinent History  unremarkable    How long can you sit comfortably?  no problem    How long can you stand comfortably?  30 minutes    How long can you walk comfortably?  40 minutes    Patient Stated Goals  drive, walk without crutches or boot, bike    Currently in Pain?  Yes    Pain Score  5     Pain Location  Axilla    Pain Orientation  Right;Posterior    Pain Descriptors /  Indicators  Aching    Pain Type  Chronic pain    Pain Onset  More than a month ago    Pain Frequency  Intermittent    Aggravating Factors   wt bearing    Pain Relieving Factors  massage and ice            [facility] Adult PT Treatment/Exercise - 01/06/20 0001      Ambulation/Gait   Gait Comments  TM 2.2 mph for proper gt       Exercises   Exercises  Ankle      Knee/Hip Exercises: Stretches   Other Knee/Hip Stretches  Plantar stretch 3 x 30"      Knee/Hip Exercises: Standing   Heel Raises Limitations  up on both lift LT hold 20" x 5     Other Standing Knee Exercises  back against counter to keep good alignment toe raises x 15;     Other Standing Knee Exercises  crouch to ground in corner and raise up without UE assist x 7       Manual Therapy  Manual Therapy  Soft tissue mobilization;Passive ROM;Myofascial release    Manual therapy comments  done seperate from all other aspects of treatment.     Soft tissue mobilization  posterior and ant  ankle to decrease swelling; gastroc mm to decrease tightness     Passive ROM  to DF      Ankle Exercises: Standing   Toe Raise  5 reps;10 reps;5 seconds    Toe Walk (Round Trip)  1               PT Short Term Goals - 12/30/19 1021      PT SHORT TERM GOAL #1   Title  PT RT Dorsiflexion to be 0 degrees to assist in normalizing gait    Baseline  -3    Time  4    Period  Weeks    Status  Achieved    Target Date  12/26/19      PT SHORT TERM GOAL #2   Title  PT to be able to complete one single leg heel raise on the Right to demonstrated improved strength to be able to go up and down steps    Time  2    Period  Weeks    Status  On-going   10/13: crutches but no boot.     PT SHORT TERM GOAL #3   Period  Weeks    Status  Achieved        PT Long Term Goals - 12/30/19 1022      PT LONG TERM GOAL #1   Title  PT to be able to DF RT foot to +2 to allow improved gait    Time  4    Period  Weeks    Status  Achieved       PT LONG TERM GOAL #2   Title  PT to be able to single leg stance on RT LE for 10 seconds to decrease risk of falling    Time  4    Period  Weeks    Status  On-going      PT LONG TERM GOAL #3   Title  PT to be able to verbalize and be comfortable that her ankle may be swelling for up to 6 months longer    Time  4    Period  Weeks    Status  On-going            Plan - 01/06/20 1043    Clinical Impression Statement  Encouraged pt to walk at 2.2 mph on TM at work not 1.5.  PT able to clear .5 cm when attempting Rt single leg lift was 0.  Decreased scar tisssue and swelling noted with manual    Examination-Activity Limitations  Bathing;Bed Mobility;Caring for Others;Carry;Dressing;Lift;Locomotion Level;Sit;Sleep;Squat;Stairs;Stand;Toileting;Transfers    Examination-Participation Restrictions  Church;Cleaning;Community Activity;Driving;Interpersonal Relationship;Laundry;Meal Prep;Shop;Yard Work    Stability/Clinical Decision Making  Stable/Uncomplicated    Rehab Potential  Good    PT Treatment/Interventions  ADLs/Self Care Home Management;Cryotherapy;Electrical Stimulation;DME Instruction;Gait training;Stair training;Functional mobility training;Therapeutic activities;Therapeutic exercise;Balance training;Patient/family education;Manual techniques;Manual lymph drainage;Passive range of motion;Scar mobilization;Dry needling    PT Next Visit Plan  progress activity to improve stability, strength and ROM.  Progress to full crouch as able.    PT Home Exercise Plan  Raise on both shift to RT and lower on RT only       Patient will benefit from skilled therapeutic intervention in order to improve the following deficits and impairments:  Abnormal gait, Decreased  activity tolerance, Decreased balance, Decreased endurance, Decreased knowledge of use of DME, Decreased mobility, Decreased range of motion, Decreased strength, Difficulty walking, Increased edema, Increased fascial restricitons,  Pain  Visit Diagnosis: Muscle weakness (generalized)  Other abnormalities of gait and mobility  Stiffness of right ankle, not elsewhere classified  Pain in right ankle and joints of right foot     Problem List Patient Active Problem List   Diagnosis Date Noted  . Closed right trimalleolar fracture, initial encounter 07/17/2019    Rayetta Humphrey, PT CLT [phone] 01/06/2020, 10:45 AM  [facility] [address] [place], [place], [address] Phone: [phone]   Fax:  [phone]  Name: Laura [name] MRN: [id number] Date of Birth: 1966/08/04

## 2020-01-08 ENCOUNTER — Telehealth (HOSPITAL_COMMUNITY): Payer: Self-pay | Admitting: Physical Therapy

## 2020-01-08 ENCOUNTER — Ambulatory Visit (HOSPITAL_COMMUNITY): Payer: 59 | Admitting: Physical Therapy

## 2020-01-08 NOTE — Telephone Encounter (Signed)
pt called to cancel today's appt due to she has a meeting to attend at work.

## 2020-01-09 ENCOUNTER — Encounter (HOSPITAL_COMMUNITY): Payer: Self-pay | Admitting: Physical Therapy

## 2020-01-09 DIAGNOSIS — S82851D Displaced trimalleolar fracture of right lower leg, subsequent encounter for closed fracture with routine healing: Secondary | ICD-10-CM | POA: Diagnosis not present

## 2020-01-09 NOTE — Therapy (Unsigned)
[place], [place], [address] Phone: [phone]   Fax:  [phone]  Patient Details  Name: Couture Pfahl MRN: [id number] Date of Birth: 1966-01-07 Referring Provider:  No ref. provider found  Encounter Date: 01/09/2020   Received fax from MD to continue therapy at 2x a month for the next 3 months.  Rayetta Humphrey, PT CLT [phone] 01/09/2020, 2:28 PM  [facility] [address] [place], [place], [address] Phone: [phone]   Fax:  [phone]

## 2020-01-13 ENCOUNTER — Other Ambulatory Visit: Payer: Self-pay

## 2020-01-13 ENCOUNTER — Encounter (HOSPITAL_COMMUNITY): Payer: Self-pay | Admitting: Physical Therapy

## 2020-01-13 ENCOUNTER — Ambulatory Visit (HOSPITAL_COMMUNITY): Payer: 59 | Admitting: Physical Therapy

## 2020-01-13 DIAGNOSIS — R2689 Other abnormalities of gait and mobility: Secondary | ICD-10-CM | POA: Diagnosis not present

## 2020-01-13 DIAGNOSIS — M25671 Stiffness of right ankle, not elsewhere classified: Secondary | ICD-10-CM

## 2020-01-13 DIAGNOSIS — M25571 Pain in right ankle and joints of right foot: Secondary | ICD-10-CM | POA: Diagnosis not present

## 2020-01-13 DIAGNOSIS — M6281 Muscle weakness (generalized): Secondary | ICD-10-CM

## 2020-01-13 NOTE — Therapy (Signed)
[place], [place], [address] Phone: [phone]   Fax:  [phone]  Physical Therapy Treatment  Patient Details  Name: Laura [name] MRN: [id number] Date of Birth: [birth year] Referring Provider (PT): Victorino December   Encounter Date: 01/13/2020  PT End of Session - 01/13/20 1200    Visit Number  7    Number of Visits  12    Date for PT Re-Evaluation  04/12/20    Authorization Type  [facility]    Authorization - Visit Number  7    Authorization - Number of Visits  12    PT Start Time  0915    PT Stop Time  1000    PT Time Calculation (min)  45 min    Activity Tolerance  Patient tolerated treatment well       Past Medical History:  Diagnosis Date  . Ankle fracture    Right  . ASCUS favor benign 2004   colposcopy negative  . Vertigo    on occasion     Past Surgical History:  Procedure Laterality Date  . ABDOMINAL HYSTERECTOMY  01/2010   Leiomyomata  . CESAREAN SECTION  1991   x1  . COLONOSCOPY N/A 09/03/2014   Procedure: COLONOSCOPY;  Surgeon: Rogene Houston, MD;  [facility];  Service: Endoscopy;  Laterality: N/A;  830  . COLPOSCOPY    . ORIF ANKLE FRACTURE Right 07/17/2019   Procedure: OPEN REDUCTION INTERNAL FIXATION (ORIF) ANKLE FRACTURE;  Surgeon: Nicholes Stairs, MD;  Location: [facility];  Service: Orthopedics;  Laterality: Right;  90 mins  . TUBAL LIGATION      There were no vitals filed for this visit.  Subjective Assessment - 01/13/20 1158    Subjective  PT states MD told her that her ankle will continue to have some swelling, states she is tight and would like her to continue therapy but once every other week for three months to progress pt as able in HEP for improved functional ability    Pertinent History  unremarkable    How long can you sit comfortably?  no problem    How long can you stand comfortably?  30 minutes    How long can you walk comfortably?  40 minutes    Patient Stated Goals  drive, walk without crutches or boot, bike    Currently in Pain?  No/denies    Pain Onset  More than a month ago         [facility] PT Assessment - 01/13/20 0001      Assessment   Medical Diagnosis  RT ankle fx with ORIF     Referring Provider (PT)  Victorino December    Onset Date/Surgical Date  07/17/19      Precautions   Precautions  None      Restrictions   Weight Bearing Restrictions  No      [place] residence      Prior Function   Level of Independence  Independent      Cognition   Overall Cognitive Status  Within Functional Limits for tasks assessed      Observation/Other Assessments-Edema    Edema  --   atrophy of gastroc mm on RT ; increased edema along lateral      Figure 8 Edema   Figure 8 - Right   48    Figure 8 - Left   47  Functional Tests   Functional tests  Other      Other:   Other/ Comments  Single leg raise Left: 20 , RT : raises 6 cm was unable       AROM   Right Ankle Dorsiflexion  3   was -4   Right Ankle Plantar Flexion  50    Right Ankle Inversion  25    Right Ankle Eversion  15    Left Ankle Dorsiflexion  8    Left Ankle Plantar Flexion  55    Left Ankle Inversion  30    Left Ankle Eversion  25      Strength   Right Ankle Dorsiflexion  5/5    Right Ankle Plantar Flexion  3-/5    Right Ankle Inversion  5/5    Right Ankle Eversion  5/5    Left Ankle Dorsiflexion  5/5    Left Ankle Plantar Flexion  5/5    Left Ankle Inversion  5/5    Left Ankle Eversion  5/5      Ambulation/Gait   Ambulation/Gait  Yes    Assistive device  None    Gait Pattern  Decreased stance time - right;Decreased step length - right      Balance   Balance Assessed  --   LT SLS: 45 sec; RT SLS: Unable without HHA x1                  [facility] Adult PT Treatment/Exercise - 01/13/20 0001      Exercises   Exercises  Ankle      Knee/Hip Exercises: Stretches   Gastroc Stretch  Right;3  reps;30 seconds    Gastroc Stretch Limitations  slant board      Knee/Hip Exercises: Standing   Heel Raises Limitations  Rt only x 5    Forward Lunges  Both;10 reps    Forward Lunges Limitations  Resistance pulling Rt ankle back with bungie     Other Standing Knee Exercises  back against counter to keep good alignment toe raises x 15;     Other Standing Knee Exercises  crouch to ground in corner and raise up without UE assist x 7       Manual Therapy   Manual Therapy  Soft tissue mobilization;Passive ROM;Myofascial release    Manual therapy comments  done seperate from all other aspects of treatment.     Soft tissue mobilization  posterior and ant  ankle to decrease swelling; gastroc mm to decrease tightness     Passive ROM  to DF               PT Short Term Goals - 01/13/20 1209      PT SHORT TERM GOAL #1   Title  PT RT Dorsiflexion to be 0 degrees to assist in normalizing gait    Baseline  -3    Time  4    Period  Weeks    Status  Achieved    Target Date  12/26/19      PT SHORT TERM GOAL #2   Title  PT to be able to complete one single leg heel raise on the Right to demonstrated improved strength to be able to go up and down steps    Time  2    Period  Weeks    Status  Achieved   10/13: crutches but no boot.     PT SHORT TERM GOAL #3   Period  Weeks  Status  Achieved        PT Long Term Goals - 01/13/20 1209      PT LONG TERM GOAL #1   Title  PT to be able to DF RT foot to +2 to allow improved gait    Time  4    Period  Weeks    Status  Achieved      PT LONG TERM GOAL #2   Title  PT to be able to single leg stance on RT LE for 10 seconds to decrease risk of falling    Time  4    Period  Weeks    Status  Partially Met      PT LONG TERM GOAL #3   Title  PT to be able to verbalize and be comfortable that her ankle may be swelling for up to 6 months longer    Time  4    Period  Weeks    Status  Achieved      PT LONG TERM GOAL #4   Title  PT to be  able to DF RT foot to 6 degrees for normal heel toe gait pattern.,   2.08/2020   Time  2    Period  Months    Status  New    Target Date  03/09/20      PT LONG TERM GOAL #5   Title  PT to be able to Raise up on RT toes only 9cm3    Time  3    Period  Months    Status  New    Target Date  04/13/20            Plan - 01/13/20 1205    Clinical Impression Statement  Pt feels that she has benefitted from the guidance of physical therapy and is completing a well rounded HEP.  PT has increased from unable to complete a single heel raise to being able to lift 6 cm, although she fatigues quickly.  ROM has imroved from -4 to 3 degrees.  PT will benefit from skilled therapy coming everyother week for 3 months to update HEP as able, ie jumping,    Examination-Activity Limitations  Bathing;Bed Mobility;Caring for Others;Carry;Dressing;Lift;Locomotion Level;Sit;Sleep;Squat;Stairs;Stand;Toileting;Transfers    Examination-Participation Restrictions  Church;Cleaning;Community Activity;Driving;Interpersonal Relationship;Laundry;Meal Prep;Shop;Yard Work    Stability/Clinical Decision Making  Stable/Uncomplicated    Rehab Potential  Good    PT Frequency  Biweekly    PT Duration  12 weeks    PT Treatment/Interventions  ADLs/Self Care Home Management;Cryotherapy;Electrical Stimulation;DME Instruction;Gait training;Stair training;Functional mobility training;Therapeutic activities;Therapeutic exercise;Balance training;Patient/family education;Manual techniques;Manual lymph drainage;Passive range of motion;Scar mobilization;Dry needling    PT Next Visit Plan  Give NEW HEP to focus on each time as pt will work on [place] RT heelraise.       Patient will benefit from skilled therapeutic intervention in order to improve the following deficits and impairments:  Abnormal gait, Decreased activity tolerance, Decreased balance, Decreased endurance, Decreased knowledge of use of DME,  Decreased mobility, Decreased range of motion, Decreased strength, Difficulty walking, Increased edema, Increased fascial restricitons, Pain  Visit Diagnosis: Muscle weakness (generalized)  Other abnormalities of gait and mobility  Stiffness of right ankle, not elsewhere classified     Problem List Patient Active Problem List   Diagnosis Date Noted  . Closed right trimalleolar fracture, initial encounter 07/17/2019    Rayetta Humphrey, PT CLT [phone] 01/13/2020, 12:12 PM  [place]  [place] [address] [place], [place], [address] Phone: [phone]   Fax:  [phone]  Name: Laura [name] MRN: [id number] Date of Birth: Nov 19, 1966

## 2020-01-15 ENCOUNTER — Ambulatory Visit (HOSPITAL_COMMUNITY): Payer: 59 | Admitting: Physical Therapy

## 2020-02-04 ENCOUNTER — Telehealth (HOSPITAL_COMMUNITY): Payer: Self-pay | Admitting: Physical Therapy

## 2020-02-04 NOTE — Telephone Encounter (Signed)
pt called to cancel the appt stated that she is ready to be discharged and is doing very well.

## 2020-02-05 ENCOUNTER — Ambulatory Visit (HOSPITAL_COMMUNITY): Payer: 59 | Admitting: Physical Therapy

## 2020-02-05 ENCOUNTER — Encounter (HOSPITAL_COMMUNITY): Payer: Self-pay

## 2020-02-19 ENCOUNTER — Encounter (HOSPITAL_COMMUNITY): Payer: 59 | Admitting: Physical Therapy

## 2020-03-04 ENCOUNTER — Encounter (HOSPITAL_COMMUNITY): Payer: 59 | Admitting: Physical Therapy

## 2020-03-25 ENCOUNTER — Encounter (HOSPITAL_COMMUNITY): Payer: 59 | Admitting: Physical Therapy

## 2020-04-08 ENCOUNTER — Encounter (HOSPITAL_COMMUNITY): Payer: 59 | Admitting: Physical Therapy

## 2020-04-22 ENCOUNTER — Encounter (HOSPITAL_COMMUNITY): Payer: 59 | Admitting: Physical Therapy

## 2020-05-12 ENCOUNTER — Other Ambulatory Visit: Payer: Self-pay | Admitting: Internal Medicine

## 2020-05-12 DIAGNOSIS — Z1231 Encounter for screening mammogram for malignant neoplasm of breast: Secondary | ICD-10-CM

## 2020-05-13 DIAGNOSIS — N951 Menopausal and female climacteric states: Secondary | ICD-10-CM | POA: Diagnosis not present

## 2020-05-13 DIAGNOSIS — R5383 Other fatigue: Secondary | ICD-10-CM | POA: Diagnosis not present

## 2020-05-13 DIAGNOSIS — R6882 Decreased libido: Secondary | ICD-10-CM | POA: Diagnosis not present

## 2020-06-01 ENCOUNTER — Ambulatory Visit
Admission: RE | Admit: 2020-06-01 | Discharge: 2020-06-01 | Disposition: A | Discharge status: Home / Self Care | Payer: 59 | Source: Ambulatory Visit

## 2020-06-01 ENCOUNTER — Other Ambulatory Visit: Payer: Self-pay

## 2020-06-01 DIAGNOSIS — Z1231 Encounter for screening mammogram for malignant neoplasm of breast: Secondary | ICD-10-CM

## 2020-06-23 MED FILL — ESTRADIOL 0.05 MG/DAY PATCH: 0.05 | 28 days supply | Qty: 4 | Fill #0

## 2020-07-13 DIAGNOSIS — H5213 Myopia, bilateral: Secondary | ICD-10-CM | POA: Diagnosis not present

## 2020-07-13 DIAGNOSIS — H524 Presbyopia: Secondary | ICD-10-CM | POA: Diagnosis not present

## 2020-07-20 ENCOUNTER — Other Ambulatory Visit (HOSPITAL_COMMUNITY): Payer: Self-pay | Admitting: Obstetrics and Gynecology

## 2020-07-20 DIAGNOSIS — R6882 Decreased libido: Secondary | ICD-10-CM | POA: Diagnosis not present

## 2020-07-20 DIAGNOSIS — N951 Menopausal and female climacteric states: Secondary | ICD-10-CM | POA: Diagnosis not present

## 2020-07-20 MED FILL — ESTRADIOL 0.05 MG/DAY PATCH: 0.05 | 84 days supply | Qty: 12 | Fill #0

## 2020-08-12 DIAGNOSIS — Z1159 Encounter for screening for other viral diseases: Secondary | ICD-10-CM | POA: Diagnosis not present

## 2020-08-12 DIAGNOSIS — Z23 Encounter for immunization: Secondary | ICD-10-CM | POA: Diagnosis not present

## 2020-08-12 DIAGNOSIS — E7800 Pure hypercholesterolemia, unspecified: Secondary | ICD-10-CM | POA: Diagnosis not present

## 2020-08-12 DIAGNOSIS — Z0000 Encounter for general adult medical examination without abnormal findings: Secondary | ICD-10-CM | POA: Diagnosis not present

## 2020-08-12 DIAGNOSIS — Z1211 Encounter for screening for malignant neoplasm of colon: Secondary | ICD-10-CM | POA: Diagnosis not present

## 2020-08-19 DIAGNOSIS — Z1211 Encounter for screening for malignant neoplasm of colon: Secondary | ICD-10-CM | POA: Diagnosis not present

## 2020-08-20 ENCOUNTER — Other Ambulatory Visit: Payer: Self-pay

## 2020-08-20 ENCOUNTER — Other Ambulatory Visit: Payer: 59

## 2020-08-20 DIAGNOSIS — Z20822 Contact with and (suspected) exposure to covid-19: Secondary | ICD-10-CM

## 2020-08-23 LAB — NOVEL CORONAVIRUS, NAA: SARS-CoV-2, NAA: NOT DETECTED

## 2020-08-30 ENCOUNTER — Encounter (HOSPITAL_COMMUNITY): Payer: Self-pay | Admitting: Physical Therapy

## 2020-08-30 NOTE — Therapy (Signed)
[place], [place], [address] Phone: [phone]   Fax:  [phone]  Patient Details  Name: Laura [name] MRN: [id number] Date of Birth: 01-27-1966 Referring Provider:  No ref. provider found  Encounter Date: 08/30/2020   PHYSICAL THERAPY DISCHARGE SUMMARY  Visits from Start of Care: 7  Current functional level related to goals / functional outcomes: Goals met   Remaining deficits: Occasional stiffness   Education / Equipment: HEP Plan: Patient agrees to discharge.  Patient goals were not met. Patient is being discharged due to being pleased with the current functional level.  ?????     Rayetta Humphrey, PT CLT [phone] 08/30/2020, 4:48 PM  [place] [address] [place], [place], [address] Phone: [phone]   Fax:  [phone]

## 2020-09-02 DIAGNOSIS — R05 Cough: Secondary | ICD-10-CM | POA: Diagnosis not present

## 2020-09-27 MED FILL — ESTRADIOL 0.05 MG/DAY PATCH: 0.05 | 84 days supply | Qty: 12 | Fill #1

## 2020-11-12 DIAGNOSIS — Z23 Encounter for immunization: Secondary | ICD-10-CM | POA: Diagnosis not present

## 2020-12-13 MED FILL — ESTRADIOL 0.05 MG/DAY PATCH: 0.05 | 84 days supply | Qty: 12 | Fill #2

## 2020-12-21 ENCOUNTER — Encounter (INDEPENDENT_AMBULATORY_CARE_PROVIDER_SITE_OTHER): Payer: Self-pay

## 2020-12-29 ENCOUNTER — Encounter (INDEPENDENT_AMBULATORY_CARE_PROVIDER_SITE_OTHER): Payer: 59 | Admitting: Bariatrics

## 2020-12-29 ENCOUNTER — Encounter (INDEPENDENT_AMBULATORY_CARE_PROVIDER_SITE_OTHER): Payer: Self-pay

## 2020-12-29 ENCOUNTER — Encounter (INDEPENDENT_AMBULATORY_CARE_PROVIDER_SITE_OTHER): Payer: Self-pay | Admitting: Bariatrics

## 2020-12-29 ENCOUNTER — Other Ambulatory Visit: Payer: Self-pay

## 2020-12-30 NOTE — Progress Notes (Signed)
This encounter was created in error - please disregard.  This encounter was created in error - please disregard.

## 2021-01-03 ENCOUNTER — Encounter (HOSPITAL_COMMUNITY): Payer: Self-pay | Admitting: Physical Therapy

## 2021-01-03 NOTE — Therapy (Signed)
[place], [place], [address] Phone: [phone]   Fax:  [phone]  Patient Details  Name: Mayuri Staples MRN: [id number] Date of Birth: 02-10-1966 Referring Provider:  No ref. provider found  Encounter Date: 01/03/2021  Error see discharge note  01/03/2021, 10:48 AM  [place], [place], [address] Phone: [phone]   Fax:  [phone]

## 2021-01-12 ENCOUNTER — Ambulatory Visit (INDEPENDENT_AMBULATORY_CARE_PROVIDER_SITE_OTHER): Payer: 59 | Admitting: Bariatrics

## 2021-01-20 ENCOUNTER — Other Ambulatory Visit: Payer: Self-pay

## 2021-01-20 ENCOUNTER — Ambulatory Visit (INDEPENDENT_AMBULATORY_CARE_PROVIDER_SITE_OTHER): Payer: 59 | Admitting: Family Medicine

## 2021-01-20 ENCOUNTER — Encounter (INDEPENDENT_AMBULATORY_CARE_PROVIDER_SITE_OTHER): Payer: Self-pay | Admitting: Family Medicine

## 2021-01-20 VITALS — BP 111/68 | HR 83 | Temp 98.2°F | Ht 62.0 in | Wt 168.0 lb

## 2021-01-20 DIAGNOSIS — Z1331 Encounter for screening for depression: Secondary | ICD-10-CM | POA: Diagnosis not present

## 2021-01-20 DIAGNOSIS — R5383 Other fatigue: Secondary | ICD-10-CM

## 2021-01-20 DIAGNOSIS — E669 Obesity, unspecified: Secondary | ICD-10-CM | POA: Diagnosis not present

## 2021-01-20 DIAGNOSIS — Z6830 Body mass index (BMI) 30.0-30.9, adult: Secondary | ICD-10-CM | POA: Diagnosis not present

## 2021-01-20 DIAGNOSIS — R0602 Shortness of breath: Secondary | ICD-10-CM | POA: Diagnosis not present

## 2021-01-20 DIAGNOSIS — E7849 Other hyperlipidemia: Secondary | ICD-10-CM | POA: Insufficient documentation

## 2021-01-20 DIAGNOSIS — E782 Mixed hyperlipidemia: Secondary | ICD-10-CM | POA: Insufficient documentation

## 2021-01-20 DIAGNOSIS — E538 Deficiency of other specified B group vitamins: Secondary | ICD-10-CM | POA: Diagnosis not present

## 2021-01-20 DIAGNOSIS — E559 Vitamin D deficiency, unspecified: Secondary | ICD-10-CM | POA: Insufficient documentation

## 2021-01-20 DIAGNOSIS — Z0289 Encounter for other administrative examinations: Secondary | ICD-10-CM

## 2021-01-20 DIAGNOSIS — Z9189 Other specified personal risk factors, not elsewhere classified: Secondary | ICD-10-CM
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
## 2021-01-21 LAB — VITAMIN B12: Vitamin B-12: 643 pg/mL (ref 232–1245)

## 2021-01-21 LAB — COMPREHENSIVE METABOLIC PANEL
ALT: 61 IU/L — ABNORMAL HIGH (ref 0–32)
AST: 32 IU/L (ref 0–40)
Albumin/Globulin Ratio: 1.4 (ref 1.2–2.2)
Albumin: 4.4 g/dL (ref 3.8–4.9)
Alkaline Phosphatase: 76 IU/L (ref 44–121)
BUN/Creatinine Ratio: 16 (ref 9–23)
BUN: 11 mg/dL (ref 6–24)
Bilirubin Total: 0.2 mg/dL (ref 0.0–1.2)
CO2: 22 mmol/L (ref 20–29)
Calcium: 9.9 mg/dL (ref 8.7–10.2)
Chloride: 100 mmol/L (ref 96–106)
Creatinine, Ser: 0.7 mg/dL (ref 0.57–1.00)
GFR calc Af Amer: 112 mL/min/{1.73_m2} (ref >59)
GFR calc non Af Amer: 97 mL/min/{1.73_m2} (ref >59)
Globulin, Total: 3.2 g/dL (ref 1.5–4.5)
Glucose: 82 mg/dL (ref 65–99)
Potassium: 4.5 mmol/L (ref 3.5–5.2)
Sodium: 139 mmol/L (ref 134–144)
Total Protein: 7.6 g/dL (ref 6.0–8.5)

## 2021-01-21 LAB — CBC WITH DIFFERENTIAL/PLATELET
Basophils Absolute: 0 10*3/uL (ref 0.0–0.2)
Basos: 1 %
EOS (ABSOLUTE): 0.1 10*3/uL (ref 0.0–0.4)
Eos: 2 %
Hematocrit: 42.2 % (ref 34.0–46.6)
Hemoglobin: 13.7 g/dL (ref 11.1–15.9)
Immature Grans (Abs): 0 10*3/uL (ref 0.0–0.1)
Immature Granulocytes: 1 %
Lymphocytes Absolute: 1.8 10*3/uL (ref 0.7–3.1)
Lymphs: 34 %
MCH: 29.8 pg (ref 26.6–33.0)
MCHC: 32.5 g/dL (ref 31.5–35.7)
MCV: 92 fL (ref 79–97)
Monocytes Absolute: 0.4 10*3/uL (ref 0.1–0.9)
Monocytes: 7 %
Neutrophils Absolute: 3 10*3/uL (ref 1.4–7.0)
Neutrophils: 55 %
Platelets: 269 10*3/uL (ref 150–450)
RBC: 4.59 x10E6/uL (ref 3.77–5.28)
RDW: 11.8 % (ref 11.7–15.4)
WBC: 5.4 10*3/uL (ref 3.4–10.8)

## 2021-01-21 LAB — TSH: TSH: 1.48 u[IU]/mL (ref 0.450–4.500)

## 2021-01-21 LAB — FOLATE: Folate: 7.9 ng/mL (ref >3.0)

## 2021-01-21 LAB — LIPID PANEL WITH LDL/HDL RATIO
Cholesterol, Total: 246 mg/dL — ABNORMAL HIGH (ref 100–199)
HDL: 85 mg/dL (ref >39)
LDL Chol Calc (NIH): 151 mg/dL — ABNORMAL HIGH (ref 0–99)
LDL/HDL Ratio: 1.8 ratio (ref 0.0–3.2)
Triglycerides: 62 mg/dL (ref 0–149)
VLDL Cholesterol Cal: 10 mg/dL (ref 5–40)

## 2021-01-21 LAB — VITAMIN D 25 HYDROXY (VIT D DEFICIENCY, FRACTURES): Vit D, 25-Hydroxy: 34.4 ng/mL (ref 30.0–100.0)

## 2021-01-21 LAB — HEMOGLOBIN A1C
Est. average glucose Bld gHb Est-mCnc: 103 mg/dL
Hgb A1c MFr Bld: 5.2 % (ref 4.8–5.6)

## 2021-01-21 LAB — T3: T3, Total: 123 ng/dL (ref 71–180)

## 2021-01-21 LAB — INSULIN, RANDOM: INSULIN: 11.9 u[IU]/mL (ref 2.6–24.9)

## 2021-01-21 LAB — T4: T4, Total: 7.9 ug/dL (ref 4.5–12.0)

## 2021-01-24 ENCOUNTER — Encounter (INDEPENDENT_AMBULATORY_CARE_PROVIDER_SITE_OTHER): Payer: Self-pay | Admitting: Family Medicine

## 2021-01-26 NOTE — Progress Notes (Signed)
Chief Complaint:   OBESITY Laura [name] (MR# [id number]) is a 55 y.o. female who presents for evaluation and treatment of obesity and related comorbidities. Current BMI is Body mass index is 30.73 kg/m. Laura [name] has been struggling with her weight for many years and has been unsuccessful in either losing weight, maintaining weight loss, or reaching her healthy weight goal.  Laura [name] has gained approximately 20 lbs in the last year.  Laura [name] is currently in the action stage of change and ready to dedicate time achieving and maintaining a healthier weight. Laura [name] is interested in becoming our patient and working on intensive lifestyle modifications including (but not limited to) diet and exercise for weight loss.  Laura [name]'s habits were reviewed today and are as follows: Her family eats meals together, she thinks her family will eat healthier with her, her desired weight loss is 33 lbs, she started gaining weight as she aged, her heaviest weight ever was 168 pounds, she is a picky eater and doesn't like to eat healthier foods, she has significant food cravings issues, she snacks frequently in the evenings, she skips meals frequently, she is frequently drinking liquids with calories, she frequently makes poor food choices, she has problems with excessive hunger and she struggles with emotional eating.  Depression Screen Laura [name]'s Food and Mood (modified PHQ-9) score was 3.  Depression screen [facility] 2/9 01/20/2021  Decreased Interest 0  Down, Depressed, Hopeless 1  PHQ - 2 Score 1  Altered sleeping 0  Tired, decreased energy 1  Change in appetite 1  Feeling bad or failure about yourself  0  Trouble concentrating 0  Moving slowly or fidgety/restless 0  Suicidal thoughts 0  PHQ-9 Score 3  Difficult doing work/chores Not difficult at all   Subjective:   1. Other fatigue Quinley admits to daytime somnolence and admits to waking up still tired. Patent has a history of symptoms of  daytime fatigue. Laura [name] generally gets 8 hours of sleep per night, and states that she has nightime awakenings. Snoring is not present. Apneic episodes are not present. Epworth Sleepiness Score is 12.  2. Shortness of breath on exertion [name] notes increasing shortness of breath with exercising and seems to be worsening over time with weight gain. She notes getting out of breath sooner with activity than she used to. This has not gotten worse recently. [name] denies shortness of breath at rest or orthopnea.  3. Vitamin D deficiency Laura [name] has been on Vit D supplementation. She has no recent labs to look at levels. She notes fatigue.  4. B12 deficiency Laura [name] has been on B12 in the past history of anemia. She has no recent labs, and she notes fatigue.  5. Mixed hyperlipidemia Laura [name] has a history of elevated cholesterol. She is not on statin, and she is ready to work on diet and exercise.  6. At risk for heart disease Laura [name] is at a higher than average risk for cardiovascular disease due to obesity.   Assessment/Plan:   1. Other fatigue Laura [name] does feel that her weight is causing her energy to be lower than it should be. Fatigue may be related to obesity, depression or many other causes. Labs will be ordered, and in the meanwhile, Laura [name] will focus on self care including making healthy food choices, increasing physical activity and focusing on stress reduction.  - CBC with Differential/Platelet - EKG 12-Lead - Folate - Hemoglobin A1c - Insulin, random - T3 - T4 - TSH  2. Shortness of breath  on exertion Laura [name] does feel that she gets out of breath more easily that she used to when she exercises. Laura [name]'s shortness of breath appears to be obesity related and exercise induced. She has agreed to work on weight loss and gradually increase exercise to treat her exercise induced shortness of breath. Will continue to monitor closely.  3. Vitamin D deficiency Low Vitamin D level  contributes to fatigue and are associated with obesity, breast, and colon cancer. We will check labs today. Laura [name] will follow-up for routine testing of Vitamin D, at least 2-3 times per year to avoid over-replacement.  - VITAMIN D 25 Hydroxy (Vit-D Deficiency, Fractures)  4. B12 deficiency The diagnosis was reviewed with the patient. We will check labs today. Laura [name] will continue to follow up as directed. Orders and follow up as documented in patient record.  - Vitamin B12  5. Mixed hyperlipidemia Cardiovascular risk and specific lipid/LDL goals reviewed. We discussed several lifestyle modifications today. We will check labs today. Rolando will start her Category 2 plan, and will continue to work on diet, exercise and weight loss efforts. Orders and follow up as documented in patient record.   - Comprehensive metabolic panel - Lipid Panel With LDL/HDL Ratio  6. Screening for depression Laura [name] had a positive depression screening. Depression is commonly associated with obesity and often results in emotional eating behaviors. We will monitor this closely and work on CBT to help improve the non-hunger eating patterns. Referral to Psychology may be required if no improvement is seen as she continues in our clinic.  7. At risk for heart disease Laura [name] was given approximately 30 minutes of coronary artery disease prevention counseling today. She is 55 y.o. female and has risk factors for heart disease including obesity. We discussed intensive lifestyle modifications today with an emphasis on specific weight loss instructions and strategies.   Repetitive spaced learning was employed today to elicit superior memory formation and behavioral change.  8. Class 1 obesity with serious comorbidity and body mass index (BMI) of 30.0 to 30.9 in adult, unspecified obesity type Laura [name] is currently in the action stage of change and her goal is to continue with weight loss efforts. I recommend Magin begin  the structured treatment plan as follows:  She has agreed to the Category 2 Plan.  Exercise goals: No exercise has been prescribed for now, while we concentrate on nutritional changes.  Behavioral modification strategies: increasing lean protein intake and no skipping meals.  She was informed of the importance of frequent follow-up visits to maximize her success with intensive lifestyle modifications for her multiple health conditions. She was informed we would discuss her lab results at her next visit unless there is a critical issue that needs to be addressed sooner. Allora agreed to keep her next visit at the agreed upon time to discuss these results.  Objective:   Blood pressure 111/68, pulse 83, temperature 98.2 F (36.8 C), height 5\' 2"  (1.575 m), weight 168 lb (76.2 kg), SpO2 98 %. Body mass index is 30.73 kg/m.  EKG: Normal sinus rhythm, rate 82 BPM.  Indirect Calorimeter completed today shows a VO2 of 200 and a REE of 1394.  Her calculated basal metabolic rate is 8938 thus her basal metabolic rate is worse than expected.  General: Cooperative, alert, well developed, in no acute distress. HEENT: Conjunctivae and lids unremarkable. Cardiovascular: Regular rhythm.  Lungs: Normal work of breathing. Neurologic: No focal deficits.   Lab Results  Component Value Date   CREATININE 0.70  01/20/2021   BUN 11 01/20/2021   NA 139 01/20/2021   K 4.5 01/20/2021   CL 100 01/20/2021   CO2 22 01/20/2021   Lab Results  Component Value Date   ALT 61 (H) 01/20/2021   AST 32 01/20/2021   ALKPHOS 76 01/20/2021   BILITOT 0.2 01/20/2021   Lab Results  Component Value Date   HGBA1C 5.2 01/20/2021   Lab Results  Component Value Date   INSULIN 11.9 01/20/2021   Lab Results  Component Value Date   TSH 1.480 01/20/2021   Lab Results  Component Value Date   CHOL 246 (H) 01/20/2021   HDL 85 01/20/2021   LDLCALC 151 (H) 01/20/2021   TRIG 62 01/20/2021   CHOLHDL 3.3 11/07/2017    Lab Results  Component Value Date   WBC 5.4 01/20/2021   HGB 13.7 01/20/2021   HCT 42.2 01/20/2021   MCV 92 01/20/2021   PLT 269 01/20/2021   No results found for: IRON, TIBC, FERRITIN  Attestation Statements:   Reviewed by clinician on day of visit: allergies, medications, problem list, medical history, surgical history, family history, social history, and previous encounter notes.   I, Trixie Dredge, am acting as transcriptionist for Dennard Nip, MD.  I have reviewed the above documentation for accuracy and completeness, and I agree with the above. - Dennard Nip, MD

## 2021-02-03 ENCOUNTER — Encounter (INDEPENDENT_AMBULATORY_CARE_PROVIDER_SITE_OTHER): Payer: Self-pay | Admitting: Family Medicine

## 2021-02-03 ENCOUNTER — Other Ambulatory Visit: Payer: Self-pay

## 2021-02-03 ENCOUNTER — Ambulatory Visit (INDEPENDENT_AMBULATORY_CARE_PROVIDER_SITE_OTHER): Payer: 59 | Admitting: Family Medicine

## 2021-02-03 VITALS — BP 108/69 | HR 91 | Temp 98.8°F | Ht 62.0 in | Wt 167.0 lb

## 2021-02-03 DIAGNOSIS — R7401 Elevation of levels of liver transaminase levels: Secondary | ICD-10-CM

## 2021-02-03 DIAGNOSIS — Z6830 Body mass index (BMI) 30.0-30.9, adult: Secondary | ICD-10-CM

## 2021-02-03 DIAGNOSIS — E8881 Metabolic syndrome: Secondary | ICD-10-CM

## 2021-02-03 DIAGNOSIS — E7849 Other hyperlipidemia: Secondary | ICD-10-CM

## 2021-02-03 DIAGNOSIS — E88819 Insulin resistance, unspecified: Secondary | ICD-10-CM

## 2021-02-03 DIAGNOSIS — E559 Vitamin D deficiency, unspecified: Secondary | ICD-10-CM | POA: Diagnosis not present

## 2021-02-03 DIAGNOSIS — Z9189 Other specified personal risk factors, not elsewhere classified: Secondary | ICD-10-CM | POA: Diagnosis not present

## 2021-02-03 DIAGNOSIS — E7800 Pure hypercholesterolemia, unspecified: Secondary | ICD-10-CM

## 2021-02-03 DIAGNOSIS — E669 Obesity, unspecified: Secondary | ICD-10-CM

## 2021-02-07 ENCOUNTER — Encounter (INDEPENDENT_AMBULATORY_CARE_PROVIDER_SITE_OTHER): Payer: Self-pay | Admitting: Family Medicine

## 2021-02-07 NOTE — Progress Notes (Signed)
Chief Complaint:   OBESITY Laura [name] is here to discuss her progress with her obesity treatment plan along with follow-up of her obesity related diagnoses. Laura [name] is on the Category 2 Plan and states she is following her eating plan approximately 99.5% of the time. Laura [name] states she is doing 0 minutes 0 times per week.  Today's visit was #: 2 Starting weight: 1687 lbs Starting date: 01/20/2021 Today's weight: 167 lbs Today's date: 02/03/2021 Total lbs lost to date: 1 Total lbs lost since last in-office visit: 1  Interim History: Laura [name] has done well on her eating plan. She did note PM hunger and sometimes had to eat extra snacks.  Subjective:   1. Vitamin D deficiency Laura [name] is on OTC Vir D, but her level is still below goal. I discussed labs with the patient today.  2. Hyperlipidemia, pure Laura [name]'s LDL is elevated, and her triglycerides and HDL are within normal limits. She is working on diet and weight loss. I discussed labs with the patient today.  3. Elevated ALT measurement Laura [name]'s ALT is mildly elevated. She denies abdominal pain or jaundice. I discussed labs with the patient today.  4. Insulin resistance Laura [name]'s A1c and glucose are within normal limits, but her fasting insulin is mildly elevated. She notes PM polyphagia even while on her eating plan.  5. At risk for heart disease Laura [name] is at a higher than average risk for cardiovascular disease due to obesity.   Assessment/Plan:   1. Vitamin D deficiency Low Vitamin D level contributes to fatigue and are associated with obesity, breast, and colon cancer. Laura [name] agreed to continue taking Vit D OTC, and add prescription Vitamin D 50,000 IU every week #4 with no refills. She will follow-up for routine testing of Vitamin D, at least 2-3 times per year to avoid over-replacement.  2. Hyperlipidemia, pure Cardiovascular risk and specific lipid/LDL goals reviewed. We discussed several lifestyle modifications today.  Laura [name] will continue to work on diet, exercise and weight loss efforts. We will recheck labs in 3 months. Orders and follow up as documented in patient record.   3. Elevated ALT measurement We discussed the likely diagnosis of non-alcoholic fatty liver disease today and how this condition is obesity related. Laura [name] was educated the importance of weight loss. Laura [name] agreed to continue with her weight loss efforts with healthier diet and exercise as an essential part of her treatment plan. We will recheck labs in 3 months.  4. Insulin resistance Laura [name] agreed to start metformin 500 mg q AM with food #30, with no refills. She will continue to work on weight loss, diet, exercise, and decreasing simple carbohydrates to help decrease the risk of diabetes. Laura [name] agreed to follow-up with [name] as directed to closely monitor her progress.  5. At risk for heart disease Laura [name] was given approximately 30 minutes of coronary artery disease prevention counseling today. She is 55 y.o. female and has risk factors for heart disease including obesity. We discussed intensive lifestyle modifications today with an emphasis on specific weight loss instructions and strategies.   Repetitive spaced learning was employed today to elicit superior memory formation and behavioral change.  6. Class 1 obesity with serious comorbidity and body mass index (BMI) of 30.0 to 30.9 in adult, unspecified obesity type Laura [name] is currently in the action stage of change. As such, her goal is to continue with weight loss efforts. She has agreed to the Category 2 Plan.   Behavioral modification strategies: increasing lean protein intake.  Laura [name]  has agreed to follow-up with our clinic in 2 weeks. She was informed of the importance of frequent follow-up visits to maximize her success with intensive lifestyle modifications for her multiple health conditions.   Objective:   Blood pressure 108/69, pulse 91, temperature 98.8 F (37.1  C), height 5\' 2"  (1.575 m), weight 167 lb (75.8 kg), SpO2 98 %. Body mass index is 30.54 kg/m.  General: Cooperative, alert, well developed, in no acute distress. HEENT: Conjunctivae and lids unremarkable. Cardiovascular: Regular rhythm.  Lungs: Normal work of breathing. Neurologic: No focal deficits.   Lab Results  Component Value Date   CREATININE 0.70 01/20/2021   BUN 11 01/20/2021   NA 139 01/20/2021   K 4.5 01/20/2021   CL 100 01/20/2021   CO2 22 01/20/2021   Lab Results  Component Value Date   ALT 61 (H) 01/20/2021   AST 32 01/20/2021   ALKPHOS 76 01/20/2021   BILITOT 0.2 01/20/2021   Lab Results  Component Value Date   HGBA1C 5.2 01/20/2021   Lab Results  Component Value Date   INSULIN 11.9 01/20/2021   Lab Results  Component Value Date   TSH 1.480 01/20/2021   Lab Results  Component Value Date   CHOL 246 (H) 01/20/2021   HDL 85 01/20/2021   LDLCALC 151 (H) 01/20/2021   TRIG 62 01/20/2021   CHOLHDL 3.3 11/07/2017   Lab Results  Component Value Date   WBC 5.4 01/20/2021   HGB 13.7 01/20/2021   HCT 42.2 01/20/2021   MCV 92 01/20/2021   PLT 269 01/20/2021   No results found for: IRON, TIBC, FERRITIN  Attestation Statements:   Reviewed by clinician on day of visit: allergies, medications, problem list, medical history, surgical history, family history, social history, and previous encounter notes.   I, Trixie Dredge, am acting as transcriptionist for Dennard Nip, MD.  I have reviewed the above documentation for accuracy and completeness, and I agree with the above. -  Dennard Nip, MD

## 2021-02-08 ENCOUNTER — Other Ambulatory Visit (INDEPENDENT_AMBULATORY_CARE_PROVIDER_SITE_OTHER): Payer: Self-pay | Admitting: Family Medicine

## 2021-02-08 MED ORDER — VITAMIN D (ERGOCALCIFEROL) 1.25 MG (50000 UNIT) PO CAPS: 50000.0000 [IU] | ORAL_CAPSULE | ORAL | 0 refills | Status: DC

## 2021-02-08 MED ORDER — METFORMIN HCL 500 MG PO TABS: 500.0000 mg | ORAL_TABLET | Freq: Every morning | ORAL | 0 refills | Status: DC

## 2021-02-08 MED FILL — VIT D2 1.25 MG (50,000 UNIT: 1.25 MG | 28 days supply | Qty: 4 | Fill #0

## 2021-02-08 MED FILL — METFORMIN HCL 500 MG TABS: 500 | 30 days supply | Qty: 30 | Fill #0

## 2021-02-08 NOTE — Telephone Encounter (Signed)
done

## 2021-02-09 DIAGNOSIS — D239 Other benign neoplasm of skin, unspecified: Secondary | ICD-10-CM | POA: Diagnosis not present

## 2021-02-09 DIAGNOSIS — L821 Other seborrheic keratosis: Secondary | ICD-10-CM | POA: Diagnosis not present

## 2021-02-09 DIAGNOSIS — L818 Other specified disorders of pigmentation: Secondary | ICD-10-CM | POA: Diagnosis not present

## 2021-02-22 ENCOUNTER — Encounter (INDEPENDENT_AMBULATORY_CARE_PROVIDER_SITE_OTHER): Payer: Self-pay | Admitting: Family Medicine

## 2021-02-22 ENCOUNTER — Other Ambulatory Visit: Payer: Self-pay

## 2021-02-22 ENCOUNTER — Ambulatory Visit (INDEPENDENT_AMBULATORY_CARE_PROVIDER_SITE_OTHER): Payer: 59 | Admitting: Family Medicine

## 2021-02-22 VITALS — BP 110/68 | HR 79 | Temp 97.9°F | Ht 62.0 in | Wt 166.0 lb

## 2021-02-22 DIAGNOSIS — E669 Obesity, unspecified: Secondary | ICD-10-CM | POA: Diagnosis not present

## 2021-02-22 DIAGNOSIS — Z9189 Other specified personal risk factors, not elsewhere classified: Secondary | ICD-10-CM | POA: Diagnosis not present

## 2021-02-22 DIAGNOSIS — E8881 Metabolic syndrome: Secondary | ICD-10-CM

## 2021-02-22 DIAGNOSIS — Z6830 Body mass index (BMI) 30.0-30.9, adult: Secondary | ICD-10-CM

## 2021-02-24 NOTE — Progress Notes (Signed)
Chief Complaint:   OBESITY Naavya is here to discuss her progress with her obesity treatment plan along with follow-up of her obesity related diagnoses. Leyda is on the Category 2 Plan and states she is following her eating plan approximately 92% of the time. Yolunda states she is doing 0 minutes 0 times per week.  Today's visit was #: 3 Starting weight: 168 lbs Starting date: 01/20/2021 Today's weight: 166 lbs Today's date: 02/22/2021 Total lbs lost to date: 2 Total lbs lost since last in-office visit: 1  Interim History: Karista continues to lose weight. She is ready to start exercising and she is open to discussing what type to start with. She notes some increased cravings in the evenings, and she did some eating out while traveling but she did well making better food choices.  Subjective:   1. Insulin resistance Pt is working on diet and weight loss. She shows no signs of hypoglycemia. She is tolerating metformin well.  2. At risk for impaired metabolic function Rashelle is at increased risk for impaired metabolic function due to weight loss.  Assessment/Plan:   1. Insulin resistance Fynn will continue metformin, and will continue to work on weight loss, exercise, and decreasing simple carbohydrates to help decrease the risk of diabetes. Lucky agreed to follow-up with [name] as directed to closely monitor her progress.  2. At risk for impaired metabolic function Lanisa was given approximately 15 minutes of impaired  metabolic function prevention counseling today. We discussed intensive lifestyle modifications today with an emphasis on specific nutrition and exercise instructions and strategies.   Repetitive spaced learning was employed today to elicit superior memory formation and behavioral change.  3. Obesity with current BMI 30.4 Shirl is currently in the action stage of change. As such, her goal is to continue with weight loss efforts. She has agreed to the  Category 2 Plan.   Exercise goals: Add strengthening 3 times per week for 20-30 minutes.  Behavioral modification strategies: increasing lean protein intake and meal planning and cooking strategies.  Hiba has agreed to follow-up with our clinic in 2 weeks. She was informed of the importance of frequent follow-up visits to maximize her success with intensive lifestyle modifications for her multiple health conditions.   Objective:   Blood pressure 110/68, pulse 79, temperature 97.9 F (36.6 C), height 5\' 2"  (1.575 m), weight 166 lb (75.3 kg), SpO2 97 %. Body mass index is 30.36 kg/m.  General: Cooperative, alert, well developed, in no acute distress. HEENT: Conjunctivae and lids unremarkable. Cardiovascular: Regular rhythm.  Lungs: Normal work of breathing. Neurologic: No focal deficits.   Lab Results  Component Value Date   CREATININE 0.70 01/20/2021   BUN 11 01/20/2021   NA 139 01/20/2021   K 4.5 01/20/2021   CL 100 01/20/2021   CO2 22 01/20/2021   Lab Results  Component Value Date   ALT 61 (H) 01/20/2021   AST 32 01/20/2021   ALKPHOS 76 01/20/2021   BILITOT 0.2 01/20/2021   Lab Results  Component Value Date   HGBA1C 5.2 01/20/2021   Lab Results  Component Value Date   INSULIN 11.9 01/20/2021   Lab Results  Component Value Date   TSH 1.480 01/20/2021   Lab Results  Component Value Date   CHOL 246 (H) 01/20/2021   HDL 85 01/20/2021   LDLCALC 151 (H) 01/20/2021   TRIG 62 01/20/2021   CHOLHDL 3.3 11/07/2017   Lab Results  Component Value Date   WBC  5.4 01/20/2021   HGB 13.7 01/20/2021   HCT 42.2 01/20/2021   MCV 92 01/20/2021   PLT 269 01/20/2021   No results found for: IRON, TIBC, FERRITIN  Attestation Statements:   Reviewed by clinician on day of visit: allergies, medications, problem list, medical history, surgical history, family history, social history, and previous encounter notes.   I, Trixie Dredge, am acting as transcriptionist for  Dennard Nip, MD.  I have reviewed the above documentation for accuracy and completeness, and I agree with the above. -  Dennard Nip, MD

## 2021-02-28 MED FILL — ESTRADIOL 0.05 MG/DAY PATCH: 0.05 | 84 days supply | Qty: 12 | Fill #3

## 2021-03-09 ENCOUNTER — Other Ambulatory Visit: Payer: Self-pay

## 2021-03-09 ENCOUNTER — Encounter (INDEPENDENT_AMBULATORY_CARE_PROVIDER_SITE_OTHER): Payer: Self-pay | Admitting: Physician Assistant

## 2021-03-09 ENCOUNTER — Ambulatory Visit (INDEPENDENT_AMBULATORY_CARE_PROVIDER_SITE_OTHER): Payer: 59 | Admitting: Physician Assistant

## 2021-03-09 VITALS — BP 125/80 | Temp 98.1°F | Ht 62.0 in | Wt 166.0 lb

## 2021-03-09 DIAGNOSIS — E559 Vitamin D deficiency, unspecified: Secondary | ICD-10-CM

## 2021-03-09 DIAGNOSIS — Z6830 Body mass index (BMI) 30.0-30.9, adult: Secondary | ICD-10-CM | POA: Diagnosis not present

## 2021-03-09 DIAGNOSIS — E8881 Metabolic syndrome: Secondary | ICD-10-CM

## 2021-03-09 DIAGNOSIS — Z9189 Other specified personal risk factors, not elsewhere classified: Secondary | ICD-10-CM

## 2021-03-09 DIAGNOSIS — E669 Obesity, unspecified: Secondary | ICD-10-CM | POA: Diagnosis not present

## 2021-03-09 MED ORDER — METFORMIN HCL 500 MG PO TABS: ORAL_TABLET | ORAL | 0 refills | Status: DC

## 2021-03-09 MED ORDER — VITAMIN D (ERGOCALCIFEROL) 1.25 MG (50000 UNIT) PO CAPS: ORAL_CAPSULE | ORAL | 0 refills | Status: DC

## 2021-03-16 NOTE — Progress Notes (Signed)
Chief Complaint:   OBESITY Laura [name] is here to discuss her progress with her obesity treatment plan along with follow-up of her obesity related diagnoses. Laura [name] is on the Category 2 Plan and states she is following her eating plan approximately 90% of the time. Laura [name] states she is doing 0 minutes of exercise 0 times per week.  Today's visit was #: 4 Starting weight: 168 lbs Starting date: 01/20/2021 Today's weight: 166 lbs Today's date: 03/09/2021 Total lbs lost to date: 2 lbs Total lbs lost since last in-office visit: 0  Interim History: Laura [name] is frustrated with lack of weight loss. She denies excessive hunger. She is following the basic plan well but after review, extra calories may be sneaking in. She is asking for a detailed workout plan today.  Subjective:   1. Vitamin [name] deficiency Laura [name] level was 34.4 on 01/20/2021. She is currently taking prescription vitamin [name] 50,000 IU each week.Tolerating well. She denies nausea, vomiting or muscle weakness.  2. Insulin resistance Laura [name] has a diagnosis of insulin resistance based on her elevated fasting insulin level >5. She continues to work on diet and exercise to decrease her risk of diabetes. Medication: Metformin, tolerating well. She denies polyphagia.  Lab Results  Component Value Date   INSULIN 11.9 01/20/2021   Lab Results  Component Value Date   HGBA1C 5.2 01/20/2021   3. At risk for diabetes mellitus Cabrini is at higher than average risk for developing diabetes due to obesity.   Assessment/Plan:   1. Vitamin [name] deficiency Low Vitamin [name] level contributes to fatigue and are associated with obesity, breast, and colon cancer. She agrees to continue to take prescription Vitamin [name] @50 ,000 IU every week and will follow-up for routine testing of Vitamin [name], at least 2-3 times per year to avoid over-replacement.  - Refill Vitamin [name], Ergocalciferol, (DRISDOL) 1.25 MG (50000 UNIT) CAPS capsule; TAKE 1  CAPSULE (50,000 UNITS TOTAL) BY MOUTH EVERY 7 (SEVEN) DAYS.  Dispense: 4 capsule; Refill: 0  2. Insulin resistance Laura [name] will continue to work on weight loss, exercise, and decreasing simple carbohydrates to help decrease the risk of diabetes. Laura [name] agreed to follow-up with Laura [name] as directed to closely monitor her progress. Will refill metformin today.  - Refill metFORMIN (GLUCOPHAGE) 500 MG tablet; TAKE 1 TABLET (500 MG TOTAL) BY MOUTH IN THE MORNING.  Dispense: 30 tablet; Refill: 0  3. At risk for diabetes mellitus Laura [name] was given approximately 15 minutes of diabetes education and counseling today. We discussed intensive lifestyle modifications today with an emphasis on weight loss as well as increasing exercise and decreasing simple carbohydrates in her diet. We also reviewed medication options with an emphasis on risk versus benefit of those discussed.   Repetitive spaced learning was employed today to elicit superior memory formation and behavioral change.  4. Obesity with current BMI 30.4 Detailed cardio and strength training plan reviewed.  Laura [name] is currently in the action stage of change. As such, her goal is to continue with weight loss efforts. She has agreed to the Category 2 Plan.   Exercise goals: For substantial health benefits, adults should do at least 150 minutes (2 hours and 30 minutes) a week of moderate-intensity, or 75 minutes (1 hour and 15 minutes) a week of vigorous-intensity aerobic physical activity, or an equivalent combination of moderate- and vigorous-intensity aerobic activity. Aerobic activity should be performed in episodes of at least 10 minutes, and preferably, it should be spread throughout the week.  Behavioral  modification strategies: meal planning and cooking strategies and keeping healthy foods in the home.  Laura [name] has agreed to follow-up with our clinic in 3 weeks. She was informed of the importance of frequent follow-up visits to maximize her  success with intensive lifestyle modifications for her multiple health conditions.   Objective:   Blood pressure 125/80, temperature 98.1 F (36.7 C), height 5\' 2"  (1.575 m), weight 166 lb (75.3 kg). Body mass index is 30.36 kg/m.  General: Cooperative, alert, well developed, in no acute distress. HEENT: Conjunctivae and lids unremarkable. Cardiovascular: Regular rhythm.  Lungs: Normal work of breathing. Neurologic: No focal deficits.   Lab Results  Component Value Date   CREATININE 0.70 01/20/2021   BUN 11 01/20/2021   NA 139 01/20/2021   K 4.5 01/20/2021   CL 100 01/20/2021   CO2 22 01/20/2021   Lab Results  Component Value Date   ALT 61 (H) 01/20/2021   AST 32 01/20/2021   ALKPHOS 76 01/20/2021   BILITOT 0.2 01/20/2021   Lab Results  Component Value Date   HGBA1C 5.2 01/20/2021   Lab Results  Component Value Date   INSULIN 11.9 01/20/2021   Lab Results  Component Value Date   TSH 1.480 01/20/2021   Lab Results  Component Value Date   CHOL 246 (H) 01/20/2021   HDL 85 01/20/2021   LDLCALC 151 (H) 01/20/2021   TRIG 62 01/20/2021   CHOLHDL 3.3 11/07/2017   Lab Results  Component Value Date   WBC 5.4 01/20/2021   HGB 13.7 01/20/2021   HCT 42.2 01/20/2021   MCV 92 01/20/2021   PLT 269 01/20/2021   Attestation Statements:   Reviewed by clinician on day of visit: allergies, medications, problem list, medical history, surgical history, family history, social history, and previous encounter notes.  Leodis Binet Friedenbach, CMA, am acting as Location manager for [employer], PA-C.  I have reviewed the above documentation for accuracy and completeness, and I agree with the above. Abby Potash, PA-C

## 2021-03-23 ENCOUNTER — Other Ambulatory Visit (HOSPITAL_COMMUNITY): Payer: Self-pay

## 2021-03-23 ENCOUNTER — Ambulatory Visit (INDEPENDENT_AMBULATORY_CARE_PROVIDER_SITE_OTHER): Payer: 59 | Admitting: Family Medicine

## 2021-03-23 ENCOUNTER — Encounter (INDEPENDENT_AMBULATORY_CARE_PROVIDER_SITE_OTHER): Payer: Self-pay | Admitting: Family Medicine

## 2021-03-23 ENCOUNTER — Other Ambulatory Visit: Payer: Self-pay

## 2021-03-23 VITALS — BP 102/66 | HR 83 | Temp 98.5°F | Ht 62.0 in | Wt 164.0 lb

## 2021-03-23 DIAGNOSIS — Z9189 Other specified personal risk factors, not elsewhere classified: Secondary | ICD-10-CM

## 2021-03-23 DIAGNOSIS — E8881 Metabolic syndrome: Secondary | ICD-10-CM | POA: Diagnosis not present

## 2021-03-23 DIAGNOSIS — E669 Obesity, unspecified: Secondary | ICD-10-CM

## 2021-03-23 DIAGNOSIS — E559 Vitamin D deficiency, unspecified: Secondary | ICD-10-CM | POA: Diagnosis not present

## 2021-03-23 DIAGNOSIS — Z6830 Body mass index (BMI) 30.0-30.9, adult: Secondary | ICD-10-CM

## 2021-03-23 MED ORDER — VITAMIN D (ERGOCALCIFEROL) 1.25 MG (50000 UNIT) PO CAPS
50000.0000 [IU] | ORAL_CAPSULE | ORAL | 0 refills | Status: DC
  Filled 2021-03-23 – 2021-03-28 (×2): qty 4, 28d supply, fill #0

## 2021-03-28 ENCOUNTER — Other Ambulatory Visit (HOSPITAL_COMMUNITY): Payer: Self-pay

## 2021-03-28 ENCOUNTER — Encounter (INDEPENDENT_AMBULATORY_CARE_PROVIDER_SITE_OTHER): Payer: Self-pay | Admitting: Family Medicine

## 2021-03-28 NOTE — Progress Notes (Signed)
Chief Complaint:   OBESITY Laura [name] is here to discuss her progress with her obesity treatment plan along with follow-up of her obesity related diagnoses. Laura [name] is on the Category 2 Plan and states she is following her eating plan approximately 90% of the time. Lateefah states she is doing cardio for 20 minutes 5 times per week.  Today's visit was #: 5 Starting weight: 168 lbs Starting date: 01/20/2021 Today's weight: 164 lbs Today's date: 03/23/2021 Total lbs lost to date: 4 Total lbs lost since last in-office visit: 2  Interim History: Laura [name] continues to do well with weight loss on her Category 2 plan. Her hunger is controlled and she is doing well with her options. She is exercising almost daily, and she has started to do some strength training.  Subjective:   1. Vitamin D deficiency Laura [name] is stable on Vit D, and she denies nausea or vomiting. Her Vit D level is not yet at goal.  2. Insulin resistance Laura [name] is doing well with diet and weight loss. She would like to hold the metformin.  3. At risk for osteoporosis Laura [name] is at higher risk of osteopenia and osteoporosis due to Vitamin D deficiency.   Assessment/Plan:   1. Vitamin D deficiency Low Vitamin D level contributes to fatigue and are associated with obesity, breast, and colon cancer. We will refill prescription Vitamin D 50,000 IU every week for 1 month. Laura [name] will follow-up for routine testing of Vitamin D, at least 2-3 times per year to avoid over-replacement.  - Vitamin D, Ergocalciferol, (DRISDOL) 1.25 MG (50000 UNIT) CAPS capsule; Take 1 capsule (50,000 Units total) by mouth every 7 (seven) days.  Dispense: 4 capsule; Refill: 0  2. Insulin resistance Laura [name] agreed to discontinue metformin, and she will continue with diet, weight loss, and decreasing simple carbohydrates to help decrease the risk of diabetes. Laura [name] agreed to follow-up with [name] as directed to closely monitor her progress.  3. At risk  for osteoporosis Laura [name] was given approximately 15 minutes of osteoporosis prevention counseling today. Laura [name] is at risk for osteopenia and osteoporosis due to her Vitamin D deficiency. She was encouraged to take her Vitamin D and follow her higher calcium diet and increase strengthening exercise to help strengthen her bones and decrease her risk of osteopenia and osteoporosis.  Repetitive spaced learning was employed today to elicit superior memory formation and behavioral change.  4. Obesity with current BMI of 30.0 Laura [name] is currently in the action stage of change. As such, her goal is to continue with weight loss efforts. She has agreed to the Category 2 Plan.   We discussed yolks and cholesterol, and the fact that low fat high cholesterol foods like eggs do not affect LDL levels, unlike high fat/high cholesterol foods.  Exercise goals: As is.  Behavioral modification strategies: meal planning and cooking strategies.  Laura [name] has agreed to follow-up with our clinic in 4 weeks. She was informed of the importance of frequent follow-up visits to maximize her success with intensive lifestyle modifications for her multiple health conditions.   Objective:   Blood pressure 102/66, pulse 83, temperature 98.5 F (36.9 C), height 5\' 2"  (1.575 m), weight 164 lb (74.4 kg), SpO2 99 %. Body mass index is 30 kg/m.  General: Cooperative, alert, well developed, in no acute distress. HEENT: Conjunctivae and lids unremarkable. Cardiovascular: Regular rhythm.  Lungs: Normal work of breathing. Neurologic: No focal deficits.   Lab Results  Component Value Date   CREATININE 0.70 01/20/2021  BUN 11 01/20/2021   NA 139 01/20/2021   K 4.5 01/20/2021   CL 100 01/20/2021   CO2 22 01/20/2021   Lab Results  Component Value Date   ALT 61 (H) 01/20/2021   AST 32 01/20/2021   ALKPHOS 76 01/20/2021   BILITOT 0.2 01/20/2021   Lab Results  Component Value Date   HGBA1C 5.2 01/20/2021   Lab  Results  Component Value Date   INSULIN 11.9 01/20/2021   Lab Results  Component Value Date   TSH 1.480 01/20/2021   Lab Results  Component Value Date   CHOL 246 (H) 01/20/2021   HDL 85 01/20/2021   LDLCALC 151 (H) 01/20/2021   TRIG 62 01/20/2021   CHOLHDL 3.3 11/07/2017   Lab Results  Component Value Date   WBC 5.4 01/20/2021   HGB 13.7 01/20/2021   HCT 42.2 01/20/2021   MCV 92 01/20/2021   PLT 269 01/20/2021   No results found for: IRON, TIBC, FERRITIN  Attestation Statements:   Reviewed by clinician on day of visit: allergies, medications, problem list, medical history, surgical history, family history, social history, and previous encounter notes.   I, Trixie Dredge, am acting as transcriptionist for Dennard Nip, MD.  I have reviewed the above documentation for accuracy and completeness, and I agree with the above. -  Dennard Nip, MD

## 2021-03-31 ENCOUNTER — Other Ambulatory Visit (HOSPITAL_COMMUNITY): Payer: Self-pay

## 2021-04-05 ENCOUNTER — Other Ambulatory Visit (HOSPITAL_COMMUNITY): Payer: Self-pay

## 2021-04-06 ENCOUNTER — Other Ambulatory Visit: Payer: Self-pay

## 2021-04-06 ENCOUNTER — Encounter (INDEPENDENT_AMBULATORY_CARE_PROVIDER_SITE_OTHER): Payer: Self-pay | Admitting: Physician Assistant

## 2021-04-06 ENCOUNTER — Ambulatory Visit (INDEPENDENT_AMBULATORY_CARE_PROVIDER_SITE_OTHER): Payer: 59 | Admitting: Physician Assistant

## 2021-04-06 VITALS — BP 104/66 | HR 79 | Temp 98.3°F | Ht 62.0 in | Wt 165.0 lb

## 2021-04-06 DIAGNOSIS — E669 Obesity, unspecified: Secondary | ICD-10-CM | POA: Diagnosis not present

## 2021-04-06 DIAGNOSIS — Z6830 Body mass index (BMI) 30.0-30.9, adult: Secondary | ICD-10-CM

## 2021-04-06 DIAGNOSIS — E8881 Metabolic syndrome: Secondary | ICD-10-CM | POA: Diagnosis not present

## 2021-04-07 NOTE — Progress Notes (Signed)
Chief Complaint:   OBESITY Laura [name] is here to discuss her progress with her obesity treatment plan along with follow-up of her obesity related diagnoses. [name] is on the Category 2 Plan and states she is following her eating plan approximately 95% of the time. [name] states she is doing strengthening and cardio.  Today's visit was #: 6 Starting weight: 168 lbs Starting date: 01/20/2021 Today's weight: 165 lbs Today's date: 04/06/2021 Total lbs lost to date: 3 Total lbs lost since last in-office visit: 0  Interim History: [name] is frustrated with the lack of weight loss. She is following her Category 2 plan very well. She is exercising regularly.  Subjective:   1. Insulin resistance [name]'s last insulin level was 11.9, and she denies polyphagia.  Assessment/Plan:   1. Insulin resistance [name] will change to the Low carbohydrate plan, and will continue to work on weight loss, exercise, and decreasing simple carbohydrates to help decrease the risk of diabetes. Clarrissa agreed to follow-up with [name] as directed to closely monitor her progress.  2. Class 1 obesity with serious comorbidity and body mass index (BMI) of 30.0 to 30.9 in adult, unspecified obesity type [name] is currently in the action stage of change. As such, her goal is to continue with weight loss efforts. She has agreed to change to following a lower carbohydrate, vegetable and lean protein rich diet plan.   Exercise goals: As is.  Behavioral modification strategies: meal planning and cooking strategies and keeping healthy foods in the home.  [name] has agreed to follow-up with our clinic in 2 weeks. She was informed of the importance of frequent follow-up visits to maximize her success with intensive lifestyle modifications for her multiple health conditions.   Objective:   Blood pressure 104/66, pulse 79, temperature 98.3 F (36.8 C), height 5\' 2"  (1.575 m), weight 165 lb (74.8 kg), SpO2 98 %. Body  mass index is 30.18 kg/m.  General: Cooperative, alert, well developed, in no acute distress. HEENT: Conjunctivae and lids unremarkable. Cardiovascular: Regular rhythm.  Lungs: Normal work of breathing. Neurologic: No focal deficits.   Lab Results  Component Value Date   CREATININE 0.70 01/20/2021   BUN 11 01/20/2021   NA 139 01/20/2021   K 4.5 01/20/2021   CL 100 01/20/2021   CO2 22 01/20/2021   Lab Results  Component Value Date   ALT 61 (H) 01/20/2021   AST 32 01/20/2021   ALKPHOS 76 01/20/2021   BILITOT 0.2 01/20/2021   Lab Results  Component Value Date   HGBA1C 5.2 01/20/2021   Lab Results  Component Value Date   INSULIN 11.9 01/20/2021   Lab Results  Component Value Date   TSH 1.480 01/20/2021   Lab Results  Component Value Date   CHOL 246 (H) 01/20/2021   HDL 85 01/20/2021   LDLCALC 151 (H) 01/20/2021   TRIG 62 01/20/2021   CHOLHDL 3.3 11/07/2017   Lab Results  Component Value Date   WBC 5.4 01/20/2021   HGB 13.7 01/20/2021   HCT 42.2 01/20/2021   MCV 92 01/20/2021   PLT 269 01/20/2021   No results found for: IRON, TIBC, FERRITIN  Attestation Statements:   Reviewed by clinician on day of visit: allergies, medications, problem list, medical history, surgical history, family history, social history, and previous encounter notes.  Time spent on visit including pre-visit chart review and post-visit care and charting was 30 minutes.    Wilhemena Durie, am acting as transcriptionist for [employer],  PA-C.  I have reviewed the above documentation for accuracy and completeness, and I agree with the above. Abby Potash, PA-C

## 2021-04-11 DIAGNOSIS — L720 Epidermal cyst: Secondary | ICD-10-CM | POA: Diagnosis not present

## 2021-04-12 ENCOUNTER — Encounter (INDEPENDENT_AMBULATORY_CARE_PROVIDER_SITE_OTHER): Payer: Self-pay | Admitting: Physician Assistant

## 2021-04-20 ENCOUNTER — Ambulatory Visit (INDEPENDENT_AMBULATORY_CARE_PROVIDER_SITE_OTHER): Payer: 59 | Admitting: Physician Assistant

## 2021-04-25 DIAGNOSIS — Z4802 Encounter for removal of sutures: Secondary | ICD-10-CM | POA: Diagnosis not present

## 2021-05-04 ENCOUNTER — Other Ambulatory Visit: Payer: Self-pay | Admitting: Internal Medicine

## 2021-05-04 DIAGNOSIS — Z1231 Encounter for screening mammogram for malignant neoplasm of breast: Secondary | ICD-10-CM

## 2021-05-05 ENCOUNTER — Other Ambulatory Visit (HOSPITAL_COMMUNITY): Payer: Self-pay

## 2021-05-06 ENCOUNTER — Other Ambulatory Visit (HOSPITAL_COMMUNITY): Payer: Self-pay

## 2021-05-06 MED ORDER — ESTRADIOL 0.05 MG/24HR TD PTWK
0.0500 mg | MEDICATED_PATCH | TRANSDERMAL | 0 refills | Status: DC
  Filled 2021-05-06 – 2021-05-20 (×3): qty 12, 84d supply, fill #0

## 2021-05-09 ENCOUNTER — Ambulatory Visit (INDEPENDENT_AMBULATORY_CARE_PROVIDER_SITE_OTHER): Payer: 59 | Admitting: Family Medicine

## 2021-05-09 ENCOUNTER — Encounter (INDEPENDENT_AMBULATORY_CARE_PROVIDER_SITE_OTHER): Payer: Self-pay | Admitting: Family Medicine

## 2021-05-09 ENCOUNTER — Other Ambulatory Visit (HOSPITAL_COMMUNITY): Payer: Self-pay

## 2021-05-09 ENCOUNTER — Other Ambulatory Visit: Payer: Self-pay

## 2021-05-09 VITALS — BP 113/75 | HR 75 | Temp 98.0°F | Ht 62.0 in | Wt 163.0 lb

## 2021-05-09 DIAGNOSIS — E8881 Metabolic syndrome: Secondary | ICD-10-CM

## 2021-05-09 DIAGNOSIS — R7989 Other specified abnormal findings of blood chemistry: Secondary | ICD-10-CM

## 2021-05-09 DIAGNOSIS — E669 Obesity, unspecified: Secondary | ICD-10-CM

## 2021-05-09 DIAGNOSIS — Z9189 Other specified personal risk factors, not elsewhere classified: Secondary | ICD-10-CM | POA: Diagnosis not present

## 2021-05-09 DIAGNOSIS — E559 Vitamin D deficiency, unspecified: Secondary | ICD-10-CM | POA: Diagnosis not present

## 2021-05-09 DIAGNOSIS — E66811 Obesity, class 1: Secondary | ICD-10-CM

## 2021-05-09 DIAGNOSIS — E7849 Other hyperlipidemia: Secondary | ICD-10-CM

## 2021-05-09 DIAGNOSIS — E88819 Insulin resistance, unspecified: Secondary | ICD-10-CM

## 2021-05-09 DIAGNOSIS — Z6830 Body mass index (BMI) 30.0-30.9, adult: Secondary | ICD-10-CM

## 2021-05-09 MED ORDER — VITAMIN D (ERGOCALCIFEROL) 1.25 MG (50000 UNIT) PO CAPS
50000.0000 [IU] | ORAL_CAPSULE | ORAL | 0 refills | Status: DC
  Filled 2021-05-09: qty 4, 28d supply, fill #0

## 2021-05-10 LAB — INSULIN, RANDOM: INSULIN: 5.8 u[IU]/mL (ref 2.6–24.9)

## 2021-05-10 LAB — LIPID PANEL WITH LDL/HDL RATIO
Cholesterol, Total: 228 mg/dL — ABNORMAL HIGH (ref 100–199)
HDL: 68 mg/dL (ref >39)
LDL Chol Calc (NIH): 149 mg/dL — ABNORMAL HIGH (ref 0–99)
LDL/HDL Ratio: 2.2 ratio (ref 0.0–3.2)
Triglycerides: 63 mg/dL (ref 0–149)
VLDL Cholesterol Cal: 11 mg/dL (ref 5–40)

## 2021-05-10 LAB — CMP14+EGFR
ALT: 15 IU/L (ref 0–32)
AST: 14 IU/L (ref 0–40)
Albumin/Globulin Ratio: 1.5 (ref 1.2–2.2)
Albumin: 4.9 g/dL (ref 3.8–4.9)
Alkaline Phosphatase: 58 IU/L (ref 44–121)
BUN/Creatinine Ratio: 18 (ref 9–23)
BUN: 12 mg/dL (ref 6–24)
Bilirubin Total: 0.3 mg/dL (ref 0.0–1.2)
CO2: 19 mmol/L — ABNORMAL LOW (ref 20–29)
Calcium: 9.9 mg/dL (ref 8.7–10.2)
Chloride: 102 mmol/L (ref 96–106)
Creatinine, Ser: 0.68 mg/dL (ref 0.57–1.00)
Globulin, Total: 3.3 g/dL (ref 1.5–4.5)
Glucose: 80 mg/dL (ref 65–99)
Potassium: 3.9 mmol/L (ref 3.5–5.2)
Sodium: 139 mmol/L (ref 134–144)
Total Protein: 8.2 g/dL (ref 6.0–8.5)
eGFR: 102 mL/min/{1.73_m2} (ref >59)

## 2021-05-10 LAB — HEMOGLOBIN A1C
Est. average glucose Bld gHb Est-mCnc: 108 mg/dL
Hgb A1c MFr Bld: 5.4 % (ref 4.8–5.6)

## 2021-05-10 LAB — VITAMIN D 25 HYDROXY (VIT D DEFICIENCY, FRACTURES): Vit D, 25-Hydroxy: 75.5 ng/mL (ref 30.0–100.0)

## 2021-05-11 ENCOUNTER — Encounter (INDEPENDENT_AMBULATORY_CARE_PROVIDER_SITE_OTHER): Payer: Self-pay | Admitting: Family Medicine

## 2021-05-12 ENCOUNTER — Other Ambulatory Visit (HOSPITAL_COMMUNITY): Payer: Self-pay

## 2021-05-17 ENCOUNTER — Other Ambulatory Visit (HOSPITAL_COMMUNITY): Payer: Self-pay

## 2021-05-17 NOTE — Progress Notes (Signed)
Chief Complaint:   OBESITY Laura [name] is here to discuss her progress with her obesity treatment plan along with follow-up of her obesity related diagnoses. Laura [name] is on following a lower carbohydrate, vegetable and lean protein rich diet plan and states she is following her eating plan approximately 80-85% of the time. Laura [name] states she is doing strength training for 60 minutes 3-4 times per week.  Today's visit was #: 7 Starting weight: 168 lbs Starting date: 01/20/2021 Today's weight: 163 lbs Today's date: 05/09/2021 Total lbs lost to date: 5 Total lbs lost since last in-office visit: 2  Interim History: Laura [name] continues to do well with weight loss. She is working on strengthening exercises, especially core and following a lower carbohydrates plan. She feels well and her hunger is controlled.  Subjective:   1. Elevated LFTs Laura [name] working on diet and weight loss. She denies abdominal pain.  2. Other hyperlipidemia Laura [name] is working on decreasing cholesterol in her diet, and she is due for labs.   3. Vitamin D deficiency Laura [name] is on Vit D, and she denies nausea or vomiting. She is due for labs.  4. Insulin resistance Laura [name] is working on decreasing simple carbohydrates in her diet. She is due for labs.  5. At risk for impaired metabolic function Laura [name] is at increased risk for impaired metabolic function if protein decreases.  Assessment/Plan:   1. Elevated LFTs We discussed the likely diagnosis of non-alcoholic fatty liver disease today and how this condition is obesity related. Laura [name] was educated the importance of weight loss. We will check labs today. Laura [name] agreed to continue with her weight loss efforts with healthier diet and exercise as an essential part of her treatment plan.  2. Other hyperlipidemia Cardiovascular risk and specific lipid/LDL goals reviewed. We discussed several lifestyle modifications today. We will check labs today. Laura [name] will  continue to work on diet, exercise and weight loss efforts. Orders and follow up as documented in patient record.   - CMP14+EGFR - Lipid Panel With LDL/HDL Ratio  3. Vitamin D deficiency Low Vitamin D level contributes to fatigue and are associated with obesity, breast, and colon cancer. We will check labs today. We will refill prescription Vitamin D for 1 month. Laura [name] will follow-up for routine testing of Vitamin D, at least 2-3 times per year to avoid over-replacement.  - Vitamin D, Ergocalciferol, (DRISDOL) 1.25 MG (50000 UNIT) CAPS capsule; Take 1 capsule (50,000 Units total) by mouth every 7 (seven) days.  Dispense: 4 capsule; Refill: 0 - VITAMIN D 25 Hydroxy (Vit-D Deficiency, Fractures)  4. Insulin resistance Laura [name] will continue to work on diet, exercise, and decreasing simple carbohydrates to help decrease the risk of diabetes. We will check labs today. Laura [name] agreed to follow-up with [name] as directed to closely monitor her progress.  - Insulin, random - Hemoglobin A1c  5. At risk for impaired metabolic function Laura [name] was given approximately 15 minutes of impaired  metabolic function prevention counseling today. We discussed intensive lifestyle modifications today with an emphasis on specific nutrition and exercise instructions and strategies.   Repetitive spaced learning was employed today to elicit superior memory formation and behavioral change.  6. Obesity with current BMI 29.9 Laura [name] is currently in the action stage of change. As such, her goal is to continue with weight loss efforts. She has agreed to the Category 2 Plan or following a lower carbohydrate, vegetable and lean protein rich diet plan.   Exercise goals: As is.  Behavioral modification strategies: meal  planning and cooking strategies.  Laura [name] has agreed to follow-up with our clinic in 4 weeks. She was informed of the importance of frequent follow-up visits to maximize her success with intensive lifestyle  modifications for her multiple health conditions.   Laura [name] was informed we would discuss her lab results at her next visit unless there is a critical issue that needs to be addressed sooner. Laura [name] agreed to keep her next visit at the agreed upon time to discuss these results.  Objective:   Blood pressure 113/75, pulse 75, temperature 98 F (36.7 C), height _0  (1.575 m), weight 163 lb (73.9 kg), SpO2 98 %. Body mass index is 29.81 kg/m.  General: Cooperative, alert, well developed, in no acute distress. HEENT: Conjunctivae and lids unremarkable. Cardiovascular: Regular rhythm.  Lungs: Normal work of breathing. Neurologic: No focal deficits.   Lab Results  Component Value Date   CREATININE 0.68 05/09/2021   BUN 12 05/09/2021   NA 139 05/09/2021   K 3.9 05/09/2021   CL 102 05/09/2021   CO2 19 (L) 05/09/2021   Lab Results  Component Value Date   ALT 15 05/09/2021   AST 14 05/09/2021   ALKPHOS 58 05/09/2021   BILITOT 0.3 05/09/2021   Lab Results  Component Value Date   HGBA1C 5.4 05/09/2021   HGBA1C 5.2 01/20/2021   Lab Results  Component Value Date   INSULIN 5.8 05/09/2021   INSULIN 11.9 01/20/2021   Lab Results  Component Value Date   TSH 1.480 01/20/2021   Lab Results  Component Value Date   CHOL 228 (H) 05/09/2021   HDL 68 05/09/2021   LDLCALC 149 (H) 05/09/2021   TRIG 63 05/09/2021   CHOLHDL 3.3 11/07/2017   Lab Results  Component Value Date   WBC 5.4 01/20/2021   HGB 13.7 01/20/2021   HCT 42.2 01/20/2021   MCV 92 01/20/2021   PLT 269 01/20/2021   No results found for: IRON, TIBC, FERRITIN  Attestation Statements:   Reviewed by clinician on day of visit: allergies, medications, problem list, medical history, surgical history, family history, social history, and previous encounter notes.   I, Trixie Dredge, am acting as transcriptionist for Dennard Nip, MD.  I have reviewed the above documentation for accuracy and completeness, and I  agree with the above. -  Dennard Nip, MD

## 2021-05-18 ENCOUNTER — Other Ambulatory Visit (HOSPITAL_COMMUNITY): Payer: Self-pay

## 2021-05-19 ENCOUNTER — Other Ambulatory Visit (HOSPITAL_COMMUNITY): Payer: Self-pay

## 2021-05-20 ENCOUNTER — Other Ambulatory Visit (HOSPITAL_COMMUNITY): Payer: Self-pay

## 2021-05-23 ENCOUNTER — Other Ambulatory Visit (HOSPITAL_COMMUNITY): Payer: Self-pay

## 2021-05-25 ENCOUNTER — Other Ambulatory Visit (HOSPITAL_COMMUNITY): Payer: Self-pay

## 2021-06-09 ENCOUNTER — Other Ambulatory Visit (HOSPITAL_COMMUNITY): Payer: Self-pay

## 2021-06-09 ENCOUNTER — Ambulatory Visit (INDEPENDENT_AMBULATORY_CARE_PROVIDER_SITE_OTHER): Payer: 59 | Admitting: Family Medicine

## 2021-06-09 ENCOUNTER — Other Ambulatory Visit: Payer: Self-pay

## 2021-06-09 ENCOUNTER — Encounter (INDEPENDENT_AMBULATORY_CARE_PROVIDER_SITE_OTHER): Payer: Self-pay | Admitting: Family Medicine

## 2021-06-09 VITALS — BP 114/77 | HR 77 | Temp 98.0°F | Ht 62.0 in | Wt 162.0 lb

## 2021-06-09 DIAGNOSIS — E559 Vitamin D deficiency, unspecified: Secondary | ICD-10-CM

## 2021-06-09 DIAGNOSIS — E7800 Pure hypercholesterolemia, unspecified: Secondary | ICD-10-CM

## 2021-06-09 DIAGNOSIS — Z9189 Other specified personal risk factors, not elsewhere classified: Secondary | ICD-10-CM

## 2021-06-09 DIAGNOSIS — Z6830 Body mass index (BMI) 30.0-30.9, adult: Secondary | ICD-10-CM

## 2021-06-09 DIAGNOSIS — E669 Obesity, unspecified: Secondary | ICD-10-CM | POA: Diagnosis not present

## 2021-06-09 MED ORDER — VITAMIN D (ERGOCALCIFEROL) 1.25 MG (50000 UNIT) PO CAPS
50000.0000 [IU] | ORAL_CAPSULE | ORAL | 0 refills | Status: DC
  Filled 2021-06-09: qty 2, 28d supply, fill #0

## 2021-06-16 NOTE — Progress Notes (Signed)
Chief Complaint:   OBESITY Laura [name] is here to discuss her progress with her obesity treatment plan along with follow-up of her obesity related diagnoses. Laura [name] is on the Category 2 Plan or following a lower carbohydrate, vegetable and lean protein rich diet plan and states she is following her eating plan approximately 90% of the time. Laura [name] states she is doing 0 minutes 0 times per week.  Today's visit was #: 8 Starting weight: 168 lbs Starting date: 01/20/2021 Today's weight: 162 lbs Today's date: 06/09/2021 Total lbs lost to date: 6 Total lbs lost since last in-office visit: 1  Interim History: Laura [name] continues to work on diet and weight loss. She is open  to starting to exercise and her work has a gym in the building.  Subjective:   1. Vitamin D deficiency Laura [name] is on Vit D, and her level is now at goal.  2. Pure hypercholesterolemia Laura [name]'s triglycerides and HDL are well controlled, but her LDL is still elevated despite weight loss and a low cholesterol diet.  3. At risk for dehydration Laura [name] is at risk for dehydration due to heat.  Assessment/Plan:   1. Vitamin D deficiency Low Vitamin D level contributes to fatigue and are associated with obesity, breast, and colon cancer. We will refill prescription Vitamin D for 1 month. Laura [name] will follow-up for routine testing of Vitamin D, at least 2-3 times per year to avoid over-replacement.  - Vitamin D, Ergocalciferol, (DRISDOL) 1.25 MG (50000 UNIT) CAPS capsule; Take 1 capsule (50,000 Units total) by mouth every 14 (fourteen) days.  Dispense: 2 capsule; Refill: 0  2. Pure hypercholesterolemia Cardiovascular risk and specific lipid/LDL goals reviewed. We discussed several lifestyle modifications today. Laura [name] will continue to work on diet, exercise and weight loss efforts. She was advised that she may need to start a statin in the future due to minimal improvement. Orders and follow up as documented in patient  record.   3. At risk for dehydration Laura [name] was given approximately 15 minutes dehydration prevention counseling today. Laura [name] is at risk for dehydration due to weight loss and current medication(s). She was encouraged to hydrate and monitor fluid status to avoid dehydration as well as weight loss plateaus.   4. Obesity with current BMI 29.6 Laura [name] is currently in the action stage of change. As such, her goal is to continue with weight loss efforts. She has agreed to the Category 2 Plan.   Laura [name] is to increase strengthening exercise 3-5 times for 15 minutes.  Behavioral modification strategies: increasing lean protein intake and increasing water intake.  Laura [name] has agreed to follow-up with our clinic in 3 to 4 weeks. She was informed of the importance of frequent follow-up visits to maximize her success with intensive lifestyle modifications for her multiple health conditions.   Objective:   Blood pressure 114/77, pulse 77, temperature 98 F (36.7 C), height 5\' 2"  (1.575 m), weight 162 lb (73.5 kg), SpO2 98 %. Body mass index is 29.63 kg/m.  General: Cooperative, alert, well developed, in no acute distress. HEENT: Conjunctivae and lids unremarkable. Cardiovascular: Regular rhythm.  Lungs: Normal work of breathing. Neurologic: No focal deficits.   Lab Results  Component Value Date   CREATININE 0.68 05/09/2021   BUN 12 05/09/2021   NA 139 05/09/2021   K 3.9 05/09/2021   CL 102 05/09/2021   CO2 19 (L) 05/09/2021   Lab Results  Component Value Date   ALT 15 05/09/2021   AST 14 05/09/2021   ALKPHOS  58 05/09/2021   BILITOT 0.3 05/09/2021   Lab Results  Component Value Date   HGBA1C 5.4 05/09/2021   HGBA1C 5.2 01/20/2021   Lab Results  Component Value Date   INSULIN 5.8 05/09/2021   INSULIN 11.9 01/20/2021   Lab Results  Component Value Date   TSH 1.480 01/20/2021   Lab Results  Component Value Date   CHOL 228 (H) 05/09/2021   HDL 68 05/09/2021   LDLCALC  149 (H) 05/09/2021   TRIG 63 05/09/2021   CHOLHDL 3.3 11/07/2017   Lab Results  Component Value Date   VD25OH 75.5 05/09/2021   VD25OH 34.4 01/20/2021   Lab Results  Component Value Date   WBC 5.4 01/20/2021   HGB 13.7 01/20/2021   HCT 42.2 01/20/2021   MCV 92 01/20/2021   PLT 269 01/20/2021   No results found for: IRON, TIBC, FERRITIN  Attestation Statements:   Reviewed by clinician on day of visit: allergies, medications, problem list, medical history, surgical history, family history, social history, and previous encounter notes.   I, Trixie Dredge, am acting as transcriptionist for Dennard Nip, MD.  I have reviewed the above documentation for accuracy and completeness, and I agree with the above. -  Dennard Nip, MD

## 2021-06-30 ENCOUNTER — Ambulatory Visit
Admission: RE | Admit: 2021-06-30 | Discharge: 2021-06-30 | Disposition: A | Discharge status: Home / Self Care | Payer: 59 | Source: Ambulatory Visit | Attending: Internal Medicine | Admitting: Internal Medicine

## 2021-06-30 ENCOUNTER — Other Ambulatory Visit: Payer: Self-pay

## 2021-06-30 DIAGNOSIS — Z1231 Encounter for screening mammogram for malignant neoplasm of breast: Secondary | ICD-10-CM

## 2021-07-04 ENCOUNTER — Other Ambulatory Visit: Payer: Self-pay

## 2021-07-04 ENCOUNTER — Other Ambulatory Visit: Payer: Self-pay | Admitting: Internal Medicine

## 2021-07-04 ENCOUNTER — Other Ambulatory Visit (HOSPITAL_COMMUNITY): Payer: Self-pay

## 2021-07-04 ENCOUNTER — Ambulatory Visit (INDEPENDENT_AMBULATORY_CARE_PROVIDER_SITE_OTHER): Payer: 59 | Admitting: Family Medicine

## 2021-07-04 ENCOUNTER — Encounter (INDEPENDENT_AMBULATORY_CARE_PROVIDER_SITE_OTHER): Payer: Self-pay | Admitting: Family Medicine

## 2021-07-04 VITALS — BP 124/75 | HR 79 | Temp 98.0°F | Ht 62.0 in | Wt 163.0 lb

## 2021-07-04 DIAGNOSIS — Z9189 Other specified personal risk factors, not elsewhere classified: Secondary | ICD-10-CM | POA: Diagnosis not present

## 2021-07-04 DIAGNOSIS — E559 Vitamin D deficiency, unspecified: Secondary | ICD-10-CM | POA: Diagnosis not present

## 2021-07-04 DIAGNOSIS — R928 Other abnormal and inconclusive findings on diagnostic imaging of breast: Secondary | ICD-10-CM

## 2021-07-04 DIAGNOSIS — Z6830 Body mass index (BMI) 30.0-30.9, adult: Secondary | ICD-10-CM

## 2021-07-04 DIAGNOSIS — E669 Obesity, unspecified: Secondary | ICD-10-CM

## 2021-07-04 DIAGNOSIS — R5383 Other fatigue: Secondary | ICD-10-CM | POA: Diagnosis not present

## 2021-07-04 MED ORDER — VITAMIN D (ERGOCALCIFEROL) 1.25 MG (50000 UNIT) PO CAPS
50000.0000 [IU] | ORAL_CAPSULE | ORAL | 0 refills | Status: DC
  Filled 2021-07-04: qty 2, 28d supply, fill #0

## 2021-07-05 ENCOUNTER — Other Ambulatory Visit (HOSPITAL_COMMUNITY): Payer: Self-pay

## 2021-07-06 NOTE — Progress Notes (Signed)
Chief Complaint:   OBESITY Laura [name] is here to discuss her progress with her obesity treatment plan along with follow-up of her obesity related diagnoses. Laura [name] is on the Category 2 Plan and states she is following her eating plan approximately 80% of the time. Laura [name] states she is doing 0 minutes 0 times per week.  Today's visit was #: 9 Starting weight: 168 lbs Starting date: 01/20/2021 Today's weight: 163 lbs Today's date: 07/04/2021 Total lbs lost to date: 5 Total lbs lost since last in-office visit: 0  Interim History: Laura [name] is retaining a bit of water today. She is open to making some changes to her eating plan.  Subjective:   1. Other fatigue Laura [name] notes increased fatigue in the last few weeks. She is sleeping between 6-7 hours per night, but she wakes up unrefreshed. She has not tried medications.  2. Vitamin D deficiency Laura [name] notes increased fatigue since she is off Vit D, and she would like to restart it. Her last Vit D level was at goal and she is at risk of over-replacement.  3. At risk for impaired metabolic function Laura [name] is at increased risk for impaired metabolic function if sleep decreases.  Assessment/Plan:   1. Other fatigue Laura [name] is ok to start melatonin OTC and we will follow up at her next visit.  2. Vitamin D deficiency Low Vitamin D level contributes to fatigue and are associated with obesity, breast, and colon cancer. We will refill prescription Vitamin D for 1 month. We will recheck labs in 2 months, and Laura [name] will follow-up for routine testing of Vitamin D, at least 2-3 times per year to avoid over-replacement.  - Vitamin D, Ergocalciferol, (DRISDOL) 1.25 MG (50000 UNIT) CAPS capsule; Take 1 capsule (50,000 Units total) by mouth every 14 (fourteen) days.  Dispense: 2 capsule; Refill: 0  3. At risk for impaired metabolic function Laura [name] was given approximately 15 minutes of impaired  metabolic function prevention counseling today. We  discussed intensive lifestyle modifications today with an emphasis on specific nutrition and exercise instructions and strategies.   Repetitive spaced learning was employed today to elicit superior memory formation and behavioral change.  4. Obesity with current BMI 29.9 Laura [name] is currently in the action stage of change. As such, her goal is to continue with weight loss efforts. She has agreed to the Category 2 Plan with breakfast and lunch options.   Behavioral modification strategies: increasing water intake.  Laura [name] has agreed to follow-up with our clinic in 4 weeks. She was informed of the importance of frequent follow-up visits to maximize her success with intensive lifestyle modifications for her multiple health conditions.   Objective:   Blood pressure 124/75, pulse 79, temperature 98 F (36.7 C), height '5\' 2"'$  (1.575 m), weight 163 lb (73.9 kg), SpO2 98 %. Body mass index is 29.81 kg/m.  General: Cooperative, alert, well developed, in no acute distress. HEENT: Conjunctivae and lids unremarkable. Cardiovascular: Regular rhythm.  Lungs: Normal work of breathing. Neurologic: No focal deficits.   Lab Results  Component Value Date   CREATININE 0.68 05/09/2021   BUN 12 05/09/2021   NA 139 05/09/2021   K 3.9 05/09/2021   CL 102 05/09/2021   CO2 19 (L) 05/09/2021   Lab Results  Component Value Date   ALT 15 05/09/2021   AST 14 05/09/2021   ALKPHOS 58 05/09/2021   BILITOT 0.3 05/09/2021   Lab Results  Component Value Date   HGBA1C 5.4 05/09/2021   HGBA1C 5.2  01/20/2021   Lab Results  Component Value Date   INSULIN 5.8 05/09/2021   INSULIN 11.9 01/20/2021   Lab Results  Component Value Date   TSH 1.480 01/20/2021   Lab Results  Component Value Date   CHOL 228 (H) 05/09/2021   HDL 68 05/09/2021   LDLCALC 149 (H) 05/09/2021   TRIG 63 05/09/2021   CHOLHDL 3.3 11/07/2017   Lab Results  Component Value Date   VD25OH 75.5 05/09/2021   VD25OH 34.4 01/20/2021    Lab Results  Component Value Date   WBC 5.4 01/20/2021   HGB 13.7 01/20/2021   HCT 42.2 01/20/2021   MCV 92 01/20/2021   PLT 269 01/20/2021   No results found for: IRON, TIBC, FERRITIN  Attestation Statements:   Reviewed by clinician on day of visit: allergies, medications, problem list, medical history, surgical history, family history, social history, and previous encounter notes.   I, Trixie Dredge, am acting as transcriptionist for Dennard Nip, MD.  I have reviewed the above documentation for accuracy and completeness, and I agree with the above. -  Dennard Nip, MD

## 2021-07-19 ENCOUNTER — Other Ambulatory Visit: Payer: Self-pay

## 2021-07-19 ENCOUNTER — Ambulatory Visit
Admission: RE | Admit: 2021-07-19 | Discharge: 2021-07-19 | Disposition: A | Discharge status: Home / Self Care | Payer: 59 | Source: Ambulatory Visit | Attending: Internal Medicine | Admitting: Internal Medicine

## 2021-07-19 DIAGNOSIS — R928 Other abnormal and inconclusive findings on diagnostic imaging of breast: Secondary | ICD-10-CM

## 2021-07-19 DIAGNOSIS — N6489 Other specified disorders of breast: Secondary | ICD-10-CM | POA: Diagnosis not present

## 2021-07-21 ENCOUNTER — Other Ambulatory Visit (HOSPITAL_COMMUNITY): Payer: Self-pay

## 2021-07-21 DIAGNOSIS — Z01419 Encounter for gynecological examination (general) (routine) without abnormal findings: Secondary | ICD-10-CM | POA: Diagnosis not present

## 2021-07-21 DIAGNOSIS — R6882 Decreased libido: Secondary | ICD-10-CM | POA: Diagnosis not present

## 2021-07-21 DIAGNOSIS — N951 Menopausal and female climacteric states: Secondary | ICD-10-CM | POA: Diagnosis not present

## 2021-07-21 MED ORDER — ESTRADIOL 0.05 MG/24HR TD PTWK
0.0500 mg | MEDICATED_PATCH | TRANSDERMAL | 3 refills | Status: DC
  Filled 2021-07-21 – 2021-08-15 (×3): qty 12, 84d supply, fill #0
  Filled 2021-11-03: qty 12, 84d supply, fill #1

## 2021-08-01 ENCOUNTER — Other Ambulatory Visit (HOSPITAL_COMMUNITY): Payer: Self-pay

## 2021-08-01 ENCOUNTER — Encounter (INDEPENDENT_AMBULATORY_CARE_PROVIDER_SITE_OTHER): Payer: Self-pay | Admitting: Family Medicine

## 2021-08-01 ENCOUNTER — Other Ambulatory Visit: Payer: Self-pay

## 2021-08-01 ENCOUNTER — Telehealth (INDEPENDENT_AMBULATORY_CARE_PROVIDER_SITE_OTHER): Payer: 59 | Admitting: Family Medicine

## 2021-08-01 DIAGNOSIS — Z6830 Body mass index (BMI) 30.0-30.9, adult: Secondary | ICD-10-CM

## 2021-08-01 DIAGNOSIS — E669 Obesity, unspecified: Secondary | ICD-10-CM | POA: Diagnosis not present

## 2021-08-01 DIAGNOSIS — E559 Vitamin D deficiency, unspecified: Secondary | ICD-10-CM | POA: Diagnosis not present

## 2021-08-01 DIAGNOSIS — R110 Nausea: Secondary | ICD-10-CM

## 2021-08-01 MED ORDER — VITAMIN D (ERGOCALCIFEROL) 1.25 MG (50000 UNIT) PO CAPS
50000.0000 [IU] | ORAL_CAPSULE | ORAL | 0 refills | Status: DC
  Filled 2021-08-01: qty 2, 28d supply, fill #0

## 2021-08-01 MED ORDER — SAXENDA 18 MG/3ML ~~LOC~~ SOPN
3.0000 mg | PEN_INJECTOR | Freq: Every day | SUBCUTANEOUS | 0 refills | Status: DC
  Filled 2021-08-01 – 2021-08-16 (×3): qty 15, 30d supply, fill #0

## 2021-08-01 NOTE — Progress Notes (Signed)
TeleHealth Visit:  Due to the COVID-19 pandemic, this visit was completed with telemedicine (audio/video) technology to reduce patient and provider exposure as well as to preserve personal protective equipment.   Juelle has verbally consented to this TeleHealth visit. The patient is located at home, the provider is located at the [facility] office. The participants in this visit include the listed provider and patient. The visit was conducted today via MyChart video.   Chief Complaint: OBESITY Sude is here to discuss her progress with her obesity treatment plan along with follow-up of her obesity related diagnoses. Myriam is on the Category 2 Plan with breakfast and lunch options and states she is following her eating plan approximately 90% of the time. Kamla states she is doing 0 minutes 0 times per week.  Today's visit was #: 10 Starting weight: 168 lbs Starting date: 01/20/2021  Interim History: Kenae is struggling with weight loss and she is feeling a bit frustrated. She would like to discuss other options to help with weight loss.  Subjective:   1. Vitamin D deficiency Nechelle is stable on Vit D, but her level is not yet at goal.  2. Nausea Chantea was feeling nausea for 1 day after eating at her parents house. She denies nausea or diarrhea.  Assessment/Plan:   1. Vitamin D deficiency Low Vitamin D level contributes to fatigue and are associated with obesity, breast, and colon cancer. We will refill prescription Vitamin D 50,000 IU every 14 days for 1 month. Carolynne will follow-up for routine testing of Vitamin D, at least 2-3 times per year to avoid over-replacement.  - Vitamin D, Ergocalciferol, (DRISDOL) 1.25 MG (50000 UNIT) CAPS capsule; Take 1 capsule (50,000 Units total) by mouth every 14 (fourteen) days.  Dispense: 2 capsule; Refill: 0  2. Nausea Javanna is ok to eat simple carbohydrates like a few crackers and increase hydration while she has  nausea. If symptoms worsen she is to contact her primary care physician.  3. Obesity whit current BMI 29.81 Tameyah is currently in the action stage of change. As such, her goal is to continue with weight loss efforts. She has agreed to the Category 2 Plan.   We discussed various medication options to help Kadesia with her weight loss efforts and we both agreed to start Saxenda 0.6 mg q daily with no refills.  - Liraglutide -Weight Management (SAXENDA) 18 MG/3ML SOPN; Inject 3 mg into the skin daily. Start at 0.6 daily  Dispense: 15 mL; Refill: 0  Behavioral modification strategies: no skipping meals and meal planning and cooking strategies.  Hazen has agreed to follow-up with our clinic in 4 weeks. Neeka is to make an appointment at the end of the day so I can show her how to take Saxenda this week. She was informed of the importance of frequent follow-up visits to maximize her success with intensive lifestyle modifications for her multiple health conditions.  Objective:   VITALS: Per patient if applicable, see vitals. GENERAL: Alert and in no acute distress. CARDIOPULMONARY: No increased WOB. Speaking in clear sentences.  PSYCH: Pleasant and cooperative. Speech normal rate and rhythm. Affect is appropriate. Insight and judgement are appropriate. Attention is focused, linear, and appropriate.  NEURO: Oriented as arrived to appointment on time with no prompting.   Lab Results  Component Value Date   CREATININE 0.68 05/09/2021   BUN 12 05/09/2021   NA 139 05/09/2021   K 3.9 05/09/2021   CL 102 05/09/2021  CO2 19 (L) 05/09/2021   Lab Results  Component Value Date   ALT 15 05/09/2021   AST 14 05/09/2021   ALKPHOS 58 05/09/2021   BILITOT 0.3 05/09/2021   Lab Results  Component Value Date   HGBA1C 5.4 05/09/2021   HGBA1C 5.2 01/20/2021   Lab Results  Component Value Date   INSULIN 5.8 05/09/2021   INSULIN 11.9 01/20/2021   Lab Results  Component Value Date   TSH 1.480  01/20/2021   Lab Results  Component Value Date   CHOL 228 (H) 05/09/2021   HDL 68 05/09/2021   LDLCALC 149 (H) 05/09/2021   TRIG 63 05/09/2021   CHOLHDL 3.3 11/07/2017   Lab Results  Component Value Date   VD25OH 75.5 05/09/2021   VD25OH 34.4 01/20/2021   Lab Results  Component Value Date   WBC 5.4 01/20/2021   HGB 13.7 01/20/2021   HCT 42.2 01/20/2021   MCV 92 01/20/2021   PLT 269 01/20/2021   No results found for: IRON, TIBC, FERRITIN  Attestation Statements:   Reviewed by clinician on day of visit: allergies, medications, problem list, medical history, surgical history, family history, social history, and previous encounter notes.   I, Trixie Dredge, am acting as transcriptionist for Dennard Nip, MD.  I have reviewed the above documentation for accuracy and completeness, and I agree with the above. - Dennard Nip, MD

## 2021-08-02 ENCOUNTER — Encounter (INDEPENDENT_AMBULATORY_CARE_PROVIDER_SITE_OTHER): Payer: Self-pay

## 2021-08-02 ENCOUNTER — Other Ambulatory Visit (HOSPITAL_COMMUNITY): Payer: Self-pay

## 2021-08-04 ENCOUNTER — Ambulatory Visit (INDEPENDENT_AMBULATORY_CARE_PROVIDER_SITE_OTHER): Payer: 59 | Admitting: Family Medicine

## 2021-08-16 ENCOUNTER — Other Ambulatory Visit (HOSPITAL_COMMUNITY): Payer: Self-pay

## 2021-08-16 MED ORDER — PEN NEEDLES 32G X 4 MM MISC
0 refills | Status: DC
  Filled 2021-08-16: qty 100, 90d supply, fill #0

## 2021-08-17 DIAGNOSIS — M6740 Ganglion, unspecified site: Secondary | ICD-10-CM | POA: Diagnosis not present

## 2021-08-17 DIAGNOSIS — E669 Obesity, unspecified: Secondary | ICD-10-CM | POA: Diagnosis not present

## 2021-08-17 DIAGNOSIS — Z0000 Encounter for general adult medical examination without abnormal findings: Secondary | ICD-10-CM | POA: Diagnosis not present

## 2021-08-17 DIAGNOSIS — Z1211 Encounter for screening for malignant neoplasm of colon: Secondary | ICD-10-CM | POA: Diagnosis not present

## 2021-08-17 DIAGNOSIS — E7800 Pure hypercholesterolemia, unspecified: Secondary | ICD-10-CM | POA: Diagnosis not present

## 2021-08-17 DIAGNOSIS — Z6830 Body mass index (BMI) 30.0-30.9, adult: Secondary | ICD-10-CM | POA: Diagnosis not present

## 2021-08-21 DIAGNOSIS — Z0000 Encounter for general adult medical examination without abnormal findings: Secondary | ICD-10-CM | POA: Diagnosis not present

## 2021-08-30 ENCOUNTER — Ambulatory Visit (INDEPENDENT_AMBULATORY_CARE_PROVIDER_SITE_OTHER): Payer: 59 | Admitting: Family Medicine

## 2021-08-31 ENCOUNTER — Other Ambulatory Visit: Payer: Self-pay

## 2021-08-31 ENCOUNTER — Encounter (INDEPENDENT_AMBULATORY_CARE_PROVIDER_SITE_OTHER): Payer: Self-pay | Admitting: Family Medicine

## 2021-08-31 ENCOUNTER — Ambulatory Visit (INDEPENDENT_AMBULATORY_CARE_PROVIDER_SITE_OTHER): Payer: 59 | Admitting: Family Medicine

## 2021-08-31 ENCOUNTER — Other Ambulatory Visit (HOSPITAL_COMMUNITY): Payer: Self-pay

## 2021-08-31 VITALS — BP 121/74 | HR 80 | Temp 98.0°F | Ht 62.0 in | Wt 160.0 lb

## 2021-08-31 DIAGNOSIS — Z6830 Body mass index (BMI) 30.0-30.9, adult: Secondary | ICD-10-CM | POA: Diagnosis not present

## 2021-08-31 DIAGNOSIS — Z9189 Other specified personal risk factors, not elsewhere classified: Secondary | ICD-10-CM

## 2021-08-31 DIAGNOSIS — E7849 Other hyperlipidemia: Secondary | ICD-10-CM

## 2021-08-31 DIAGNOSIS — E669 Obesity, unspecified: Secondary | ICD-10-CM | POA: Diagnosis not present

## 2021-08-31 DIAGNOSIS — E559 Vitamin D deficiency, unspecified: Secondary | ICD-10-CM | POA: Diagnosis not present

## 2021-08-31 MED ORDER — VITAMIN D (ERGOCALCIFEROL) 1.25 MG (50000 UNIT) PO CAPS
50000.0000 [IU] | ORAL_CAPSULE | ORAL | 0 refills | Status: DC
  Filled 2021-08-31 – 2021-09-14 (×2): qty 2, 28d supply, fill #0

## 2021-09-01 NOTE — Progress Notes (Signed)
Chief Complaint:   OBESITY Laura [name] is here to discuss her progress with her obesity treatment plan along with follow-up of her obesity related diagnoses. Laura [name] is on the Category 2 Plan and states she is following her eating plan approximately 90% of the time. Laura [name] states she is doing 0 minutes 0 times per week.  Today's visit was #: 11 Starting weight: 168 lbs Starting date: 01/20/2021 Today's weight: 160 lbs Today's date: 09/01/2021 Total lbs lost to date: 8 Total lbs lost since last in-office visit: 3  Interim History: Blossie continues to do well with weight loss. She is doing well on Saxenda at 0.6 mg. She sometimes is missing meals however.  Subjective:   1. Vitamin D deficiency Laura [name] is stable on Vit D, and she denies nausea, vomiting, or muscle weakness.  2. Other hyperlipidemia Laura [name] is working on diet and weight loss. She is not on a statin, and no chest pain was noted.  3. At risk for impaired metabolic function Laura [name] is at increased risk for impaired metabolic function if nutrition is too low.  Assessment/Plan:   1. Vitamin D deficiency Low Vitamin D level contributes to fatigue and are associated with obesity, breast, and colon cancer. We will refill prescription Vitamin D for 1 month, and we will recheck labs in 1 month. Laura [name] will follow-up for routine testing of Vitamin D, at least 2-3 times per year to avoid over-replacement.  - Vitamin D, Ergocalciferol, (DRISDOL) 1.25 MG (50000 UNIT) CAPS capsule; Take 1 capsule (50,000 Units total) by mouth every 14 (fourteen) days.  Dispense: 2 capsule; Refill: 0  2. Other hyperlipidemia Cardiovascular risk and specific lipid/LDL goals reviewed.  We discussed several lifestyle modifications today. Geni will continue to work on diet, exercise and weight loss efforts. We will recheck labs in 1 month. Orders and follow up as documented in patient record.   3. At risk for impaired metabolic function Laura [name]  was given approximately 15 minutes of impaired  metabolic function prevention counseling today. We discussed intensive lifestyle modifications today with an emphasis on specific nutrition and exercise instructions and strategies.   Repetitive spaced learning was employed today to elicit superior memory formation and behavioral change.  4. Obesity current BMI 29.3 Laura [name] is currently in the action stage of change. As such, her goal is to continue with weight loss efforts. She has agreed to the Category 2 Plan.   We discussed various medication options to help Laura [name] with her weight loss efforts and we both agreed to continue Saxenda at 0.6 mg q daily, and will continue to monitor.  Behavioral modification strategies: increasing lean protein intake and no skipping meals.  Laura [name] has agreed to follow-up with our clinic in 4 weeks. She was informed of the importance of frequent follow-up visits to maximize her success with intensive lifestyle modifications for her multiple health conditions.   Objective:   Blood pressure 121/74, pulse 80, temperature 98 F (36.7 C), height 5\' 2"  (1.575 m), weight 160 lb (72.6 kg), SpO2 96 %. Body mass index is 29.26 kg/m.  General: Cooperative, alert, well developed, in no acute distress. HEENT: Conjunctivae and lids unremarkable. Cardiovascular: Regular rhythm.  Lungs: Normal work of breathing. Neurologic: No focal deficits.   Lab Results  Component Value Date   CREATININE 0.68 05/09/2021   BUN 12 05/09/2021   NA 139 05/09/2021   K 3.9 05/09/2021   CL 102 05/09/2021   CO2 19 (L) 05/09/2021   Lab Results  Component Value  Date   ALT 15 05/09/2021   AST 14 05/09/2021   ALKPHOS 58 05/09/2021   BILITOT 0.3 05/09/2021   Lab Results  Component Value Date   HGBA1C 5.4 05/09/2021   HGBA1C 5.2 01/20/2021   Lab Results  Component Value Date   INSULIN 5.8 05/09/2021   INSULIN 11.9 01/20/2021   Lab Results  Component Value Date   TSH 1.480  01/20/2021   Lab Results  Component Value Date   CHOL 228 (H) 05/09/2021   HDL 68 05/09/2021   LDLCALC 149 (H) 05/09/2021   TRIG 63 05/09/2021   CHOLHDL 3.3 11/07/2017   Lab Results  Component Value Date   VD25OH 75.5 05/09/2021   VD25OH 34.4 01/20/2021   Lab Results  Component Value Date   WBC 5.4 01/20/2021   HGB 13.7 01/20/2021   HCT 42.2 01/20/2021   MCV 92 01/20/2021   PLT 269 01/20/2021   No results found for: IRON, TIBC, FERRITIN  Attestation Statements:   Reviewed by clinician on day of visit: allergies, medications, problem list, medical history, surgical history, family history, social history, and previous encounter notes.   I, Trixie Dredge, am acting as transcriptionist for Dennard Nip, MD.  I have reviewed the above documentation for accuracy and completeness, and I agree with the above. -  Dennard Nip, MD

## 2021-09-08 ENCOUNTER — Other Ambulatory Visit (HOSPITAL_COMMUNITY): Payer: Self-pay

## 2021-09-12 DIAGNOSIS — H5213 Myopia, bilateral: Secondary | ICD-10-CM | POA: Diagnosis not present

## 2021-09-12 DIAGNOSIS — H524 Presbyopia: Secondary | ICD-10-CM | POA: Diagnosis not present

## 2021-09-14 ENCOUNTER — Other Ambulatory Visit (HOSPITAL_COMMUNITY): Payer: Self-pay

## 2021-09-15 ENCOUNTER — Other Ambulatory Visit (HOSPITAL_COMMUNITY): Payer: Self-pay

## 2021-09-23 DIAGNOSIS — Z23 Encounter for immunization: Secondary | ICD-10-CM | POA: Diagnosis not present

## 2021-09-27 ENCOUNTER — Other Ambulatory Visit (HOSPITAL_COMMUNITY): Payer: Self-pay

## 2021-09-27 ENCOUNTER — Encounter (INDEPENDENT_AMBULATORY_CARE_PROVIDER_SITE_OTHER): Payer: Self-pay | Admitting: Family Medicine

## 2021-09-27 ENCOUNTER — Other Ambulatory Visit: Payer: Self-pay

## 2021-09-27 ENCOUNTER — Ambulatory Visit (INDEPENDENT_AMBULATORY_CARE_PROVIDER_SITE_OTHER): Payer: 59 | Admitting: Family Medicine

## 2021-09-27 VITALS — BP 107/68 | HR 87 | Temp 98.2°F | Ht 62.0 in | Wt 159.0 lb

## 2021-09-27 DIAGNOSIS — Z6830 Body mass index (BMI) 30.0-30.9, adult: Secondary | ICD-10-CM

## 2021-09-27 DIAGNOSIS — E559 Vitamin D deficiency, unspecified: Secondary | ICD-10-CM

## 2021-09-27 DIAGNOSIS — E8881 Metabolic syndrome: Secondary | ICD-10-CM

## 2021-09-27 DIAGNOSIS — E7849 Other hyperlipidemia: Secondary | ICD-10-CM

## 2021-09-27 DIAGNOSIS — Z9189 Other specified personal risk factors, not elsewhere classified: Secondary | ICD-10-CM

## 2021-09-27 DIAGNOSIS — E669 Obesity, unspecified: Secondary | ICD-10-CM | POA: Diagnosis not present

## 2021-09-27 MED ORDER — VITAMIN D (ERGOCALCIFEROL) 1.25 MG (50000 UNIT) PO CAPS
50000.0000 [IU] | ORAL_CAPSULE | ORAL | 0 refills | Status: DC
  Filled 2021-09-27: qty 2, 28d supply, fill #0

## 2021-09-27 MED ORDER — SAXENDA 18 MG/3ML ~~LOC~~ SOPN
1.2000 mg | PEN_INJECTOR | Freq: Every day | SUBCUTANEOUS | 0 refills | Status: AC
  Filled 2021-09-27: qty 6, 30d supply, fill #0

## 2021-09-27 NOTE — Progress Notes (Signed)
Chief Complaint:   OBESITY Laura [name] is here to discuss her progress with her obesity treatment plan along with follow-up of her obesity related diagnoses. Laura [name] is on the Category 2 Plan and states she is following her eating plan approximately 80% of the time. Laura [name] states she is doing 0 minutes 0 times per week.  Today's visit was #: 12 Starting weight: 168 lbs Starting date: 01/20/2021 Today's weight: 159 lbs Today's date: 09/27/2021 Total lbs lost to date: 9 Total lbs lost since last in-office visit: 1  Interim History: Laura [name] continues to work on diet and weight loss. She notes increased stress with her separation and she notes increased stress and comfort eating.  Subjective:   1. Vitamin D deficiency Laura [name] is stable on Vit D, and she is due for labs. She denies signs of over-replacement.  2. Other hyperlipidemia Laura [name] is working on diet and exercise, and she is due for labs. She denies chest pain.  3. Insulin resistance Laura [name] is on GLP-1, and she is working on diet and weight loss. She is due for labs.  4. At risk for heart disease Laura [name] is at a higher than average risk for cardiovascular disease due to obesity.   Assessment/Plan:   1. Vitamin D deficiency Low Vitamin D level contributes to fatigue and are associated with obesity, breast, and colon cancer. We will check labs today, and we will refill prescription Vitamin D for 1 month. Laura [name] will follow-up for routine testing of Vitamin D, at least 2-3 times per year to avoid over-replacement.  - Vitamin D, Ergocalciferol, (DRISDOL) 1.25 MG (50000 UNIT) CAPS capsule; Take 1 capsule (50,000 Units total) by mouth every 14 (fourteen) days.  Dispense: 2 capsule; Refill: 0 - VITAMIN D 25 Hydroxy (Vit-D Deficiency, Fractures)  2. Other hyperlipidemia Cardiovascular risk and specific lipid/LDL goals reviewed. We discussed several lifestyle modifications today. We will check labs today. Laura [name] will continue  to work on diet, exercise and weight loss efforts. Orders and follow up as documented in patient record.   - Lipid Panel With LDL/HDL Ratio  3. Insulin resistance Laura [name] will continue to work on weight loss, exercise, and decreasing simple carbohydrates to help decrease the risk of diabetes. We will check labs today. Laura [name] agreed to follow-up with [name] as directed to closely monitor her progress.  - CMP14+EGFR - Insulin, random - Hemoglobin A1c  4. At risk for heart disease Laura [name] was given approximately 15 minutes of coronary artery disease prevention counseling today. She is 55 y.o. female and has risk factors for heart disease including obesity. We discussed intensive lifestyle modifications today with an emphasis on specific weight loss instructions and strategies.   Repetitive spaced learning was employed today to elicit superior memory formation and behavioral change.  5. Obesity current BMI 29.1 Laura [name] is currently in the action stage of change. As such, her goal is to continue with weight loss efforts. She has agreed to the Category 2 Plan.   We discussed various medication options to help Laura [name] with her weight loss efforts and we both agreed to increase Saxenda slowly to 1.2 mg and we will refill for 1 month, and she will continue to work on her diet.  Behavioral modification strategies: emotional eating strategies.  Laura [name] has agreed to follow-up with our clinic in 3 weeks. She was informed of the importance of frequent follow-up visits to maximize her success with intensive lifestyle modifications for her multiple health conditions.   Laura [name] was informed we would discuss  her lab results at her next visit unless there is a critical issue that needs to be addressed sooner. Laura [name] agreed to keep her next visit at the agreed upon time to discuss these results.  Objective:   Blood pressure 107/68, pulse 87, temperature 98.2 F (36.8 C), height _0  (1.575 m), weight 159  lb (72.1 kg), SpO2 99 %. Body mass index is 29.08 kg/m.  General: Cooperative, alert, well developed, in no acute distress. HEENT: Conjunctivae and lids unremarkable. Cardiovascular: Regular rhythm.  Lungs: Normal work of breathing. Neurologic: No focal deficits.   Lab Results  Component Value Date   CREATININE 0.68 05/09/2021   BUN 12 05/09/2021   NA 139 05/09/2021   K 3.9 05/09/2021   CL 102 05/09/2021   CO2 19 (L) 05/09/2021   Lab Results  Component Value Date   ALT 15 05/09/2021   AST 14 05/09/2021   ALKPHOS 58 05/09/2021   BILITOT 0.3 05/09/2021   Lab Results  Component Value Date   HGBA1C 5.4 05/09/2021   HGBA1C 5.2 01/20/2021   Lab Results  Component Value Date   INSULIN 5.8 05/09/2021   INSULIN 11.9 01/20/2021   Lab Results  Component Value Date   TSH 1.480 01/20/2021   Lab Results  Component Value Date   CHOL 228 (H) 05/09/2021   HDL 68 05/09/2021   LDLCALC 149 (H) 05/09/2021   TRIG 63 05/09/2021   CHOLHDL 3.3 11/07/2017   Lab Results  Component Value Date   VD25OH 75.5 05/09/2021   VD25OH 34.4 01/20/2021   Lab Results  Component Value Date   WBC 5.4 01/20/2021   HGB 13.7 01/20/2021   HCT 42.2 01/20/2021   MCV 92 01/20/2021   PLT 269 01/20/2021   No results found for: IRON, TIBC, FERRITIN  Attestation Statements:   Reviewed by clinician on day of visit: allergies, medications, problem list, medical history, surgical history, family history, social history, and previous encounter notes.   I, Trixie Dredge, am acting as transcriptionist for Dennard Nip, MD.  I have reviewed the above documentation for accuracy and completeness, and I agree with the above. -  Dennard Nip, MD

## 2021-09-28 LAB — INSULIN, RANDOM: INSULIN: 6 u[IU]/mL (ref 2.6–24.9)

## 2021-09-28 LAB — LIPID PANEL WITH LDL/HDL RATIO
Cholesterol, Total: 196 mg/dL (ref 100–199)
HDL: 53 mg/dL (ref >39)
LDL Chol Calc (NIH): 132 mg/dL — ABNORMAL HIGH (ref 0–99)
LDL/HDL Ratio: 2.5 ratio (ref 0.0–3.2)
Triglycerides: 60 mg/dL (ref 0–149)
VLDL Cholesterol Cal: 11 mg/dL (ref 5–40)

## 2021-09-28 LAB — HEMOGLOBIN A1C
Est. average glucose Bld gHb Est-mCnc: 105 mg/dL
Hgb A1c MFr Bld: 5.3 % (ref 4.8–5.6)

## 2021-09-28 LAB — CMP14+EGFR
ALT: 19 IU/L (ref 0–32)
AST: 16 IU/L (ref 0–40)
Albumin/Globulin Ratio: 1.7 (ref 1.2–2.2)
Albumin: 4.4 g/dL (ref 3.8–4.9)
Alkaline Phosphatase: 58 IU/L (ref 44–121)
BUN/Creatinine Ratio: 11 (ref 9–23)
BUN: 9 mg/dL (ref 6–24)
Bilirubin Total: 0.3 mg/dL (ref 0.0–1.2)
CO2: 24 mmol/L (ref 20–29)
Calcium: 9.8 mg/dL (ref 8.7–10.2)
Chloride: 103 mmol/L (ref 96–106)
Creatinine, Ser: 0.83 mg/dL (ref 0.57–1.00)
Globulin, Total: 2.6 g/dL (ref 1.5–4.5)
Glucose: 81 mg/dL (ref 70–99)
Potassium: 4.6 mmol/L (ref 3.5–5.2)
Sodium: 140 mmol/L (ref 134–144)
Total Protein: 7 g/dL (ref 6.0–8.5)
eGFR: 82 mL/min/{1.73_m2} (ref >59)

## 2021-09-28 LAB — VITAMIN D 25 HYDROXY (VIT D DEFICIENCY, FRACTURES): Vit D, 25-Hydroxy: 54.8 ng/mL (ref 30.0–100.0)

## 2021-09-29 ENCOUNTER — Other Ambulatory Visit (HOSPITAL_COMMUNITY): Payer: Self-pay

## 2021-10-17 ENCOUNTER — Ambulatory Visit (INDEPENDENT_AMBULATORY_CARE_PROVIDER_SITE_OTHER): Payer: 59 | Admitting: Family Medicine

## 2021-11-03 ENCOUNTER — Other Ambulatory Visit (HOSPITAL_COMMUNITY): Payer: Self-pay

## 2021-11-08 ENCOUNTER — Ambulatory Visit (INDEPENDENT_AMBULATORY_CARE_PROVIDER_SITE_OTHER): Payer: 59 | Admitting: Family Medicine

## 2021-11-16 ENCOUNTER — Ambulatory Visit (INDEPENDENT_AMBULATORY_CARE_PROVIDER_SITE_OTHER): Payer: 59 | Admitting: Family Medicine

## 2021-11-16 ENCOUNTER — Encounter (INDEPENDENT_AMBULATORY_CARE_PROVIDER_SITE_OTHER): Payer: Self-pay | Admitting: Family Medicine

## 2021-11-16 ENCOUNTER — Other Ambulatory Visit (HOSPITAL_COMMUNITY): Payer: Self-pay

## 2021-11-16 ENCOUNTER — Other Ambulatory Visit: Payer: Self-pay

## 2021-11-16 VITALS — BP 111/71 | HR 85 | Temp 98.1°F | Ht 62.0 in | Wt 166.0 lb

## 2021-11-16 DIAGNOSIS — E669 Obesity, unspecified: Secondary | ICD-10-CM | POA: Diagnosis not present

## 2021-11-16 DIAGNOSIS — Z6830 Body mass index (BMI) 30.0-30.9, adult: Secondary | ICD-10-CM

## 2021-11-16 DIAGNOSIS — F3289 Other specified depressive episodes: Secondary | ICD-10-CM

## 2021-11-16 DIAGNOSIS — E8881 Metabolic syndrome: Secondary | ICD-10-CM | POA: Diagnosis not present

## 2021-11-16 MED ORDER — BUPROPION HCL ER (SR) 150 MG PO TB12
150.0000 mg | ORAL_TABLET | Freq: Every morning | ORAL | 0 refills | Status: DC
  Filled 2021-11-16: qty 30, 30d supply, fill #0

## 2021-11-16 NOTE — Progress Notes (Signed)
Chief Complaint:   OBESITY Laura [name] is here to discuss her progress with her obesity treatment plan along with follow-up of her obesity related diagnoses. Laura [name] is on the Category 2 Plan and states she is following her eating plan approximately 80% of the time. Laura [name] states she is doing 0 minutes 0 times per week.  Today's visit was #: 13 Starting weight: 168 lbs Starting date: 01/20/2021 Today's weight: 166 lbs Today's date: 11/16/2021 Total lbs lost to date: 2 Total lbs lost since last in-office visit: 0  Interim History: Laura [name] has been struggling with increased cravings and snacking, and she is feeling somewhat frustrated. Her hunger is mostly controlled. Her stress is higher and she is not able to exercise regularly.  Subjective:   1. Insulin resistance Laura [name] is on Saxenda and she is working on weight loss. There is a decrease in polyphagia but she is struggling with cravings.  2. Other depression with emotional eating Laura [name] notes increased cravings and increased fatigue. Her stress level is higher and she is open to looking at medication options.  Assessment/Plan:   1. Insulin resistance Laura [name] will continue Saxenda at 1.2 mg and we will follow up in 1 month at her next visit. She will continue with diet, exercise, and decreasing simple carbohydrates to help decrease the risk of diabetes.   2. Other depression with emotional eating Laura [name] agreed to start Wellbutrin SR 150 mg q AM with no refills. Behavior modification techniques were discussed today to help Laura [name] deal with her emotional/non-hunger eating behaviors. Orders and follow up as documented in patient record.   - buPROPion (WELLBUTRIN SR) 150 MG 12 hr tablet; Take 1 tablet (150 mg total) by mouth in the morning.  Dispense: 30 tablet; Refill: 0  3. Obesity with current BMI of 30.4 Laura [name] is currently in the action stage of change. As such, her goal is to continue with weight loss efforts. She has  agreed to the Category 2 Plan.   Behavioral modification strategies: emotional eating strategies and holiday eating strategies .  Laura [name] has agreed to follow-up with our clinic in 4 weeks. She was informed of the importance of frequent follow-up visits to maximize her success with intensive lifestyle modifications for her multiple health conditions.   Objective:   Blood pressure 111/71, pulse 85, temperature 98.1 F (36.7 C), height 5\' 2"  (1.575 m), weight 166 lb (75.3 kg), SpO2 100 %. Body mass index is 30.36 kg/m.  General: Cooperative, alert, well developed, in no acute distress. HEENT: Conjunctivae and lids unremarkable. Cardiovascular: Regular rhythm.  Lungs: Normal work of breathing. Neurologic: No focal deficits.   Lab Results  Component Value Date   CREATININE 0.83 09/27/2021   BUN 9 09/27/2021   NA 140 09/27/2021   K 4.6 09/27/2021   CL 103 09/27/2021   CO2 24 09/27/2021   Lab Results  Component Value Date   ALT 19 09/27/2021   AST 16 09/27/2021   ALKPHOS 58 09/27/2021   BILITOT 0.3 09/27/2021   Lab Results  Component Value Date   HGBA1C 5.3 09/27/2021   HGBA1C 5.4 05/09/2021   HGBA1C 5.2 01/20/2021   Lab Results  Component Value Date   INSULIN 6.0 09/27/2021   INSULIN 5.8 05/09/2021   INSULIN 11.9 01/20/2021   Lab Results  Component Value Date   TSH 1.480 01/20/2021   Lab Results  Component Value Date   CHOL 196 09/27/2021   HDL 53 09/27/2021   LDLCALC 132 (H) 09/27/2021  TRIG 60 09/27/2021   CHOLHDL 3.3 11/07/2017   Lab Results  Component Value Date   VD25OH 54.8 09/27/2021   VD25OH 75.5 05/09/2021   VD25OH 34.4 01/20/2021   Lab Results  Component Value Date   WBC 5.4 01/20/2021   HGB 13.7 01/20/2021   HCT 42.2 01/20/2021   MCV 92 01/20/2021   PLT 269 01/20/2021   No results found for: IRON, TIBC, FERRITIN  Attestation Statements:   Reviewed by clinician on day of visit: allergies, medications, problem list, medical history,  surgical history, family history, social history, and previous encounter notes.   I, Trixie Dredge, am acting as transcriptionist for Dennard Nip, MD.  I have reviewed the above documentation for accuracy and completeness, and I agree with the above. -  Dennard Nip, MD

## 2021-12-08 ENCOUNTER — Other Ambulatory Visit (HOSPITAL_BASED_OUTPATIENT_CLINIC_OR_DEPARTMENT_OTHER): Payer: Self-pay

## 2021-12-08 ENCOUNTER — Ambulatory Visit: Payer: 59 | Attending: Internal Medicine

## 2021-12-08 DIAGNOSIS — Z23 Encounter for immunization: Secondary | ICD-10-CM

## 2021-12-08 MED ORDER — PFIZER COVID-19 VAC BIVALENT 30 MCG/0.3ML IM SUSP
INTRAMUSCULAR | 0 refills | Status: DC
  Filled 2021-12-08: qty 0.3, 1d supply, fill #0

## 2021-12-08 NOTE — Progress Notes (Signed)
° °  Covid-19 Vaccination Clinic  Name:  Layci Stenglein    MRN: [id number] DOB: 04-Sep-1966  12/08/2021  Ms. Hommes was observed post Covid-19 immunization for 15 minutes without incident. She was provided with Vaccine Information Sheet and instruction to access the V-Safe system.   Ms. Stotz was instructed to call 911 with any severe reactions post vaccine: Difficulty breathing  Swelling of face and throat  A fast heartbeat  A bad rash all over body  Dizziness and weakness   Immunizations Administered     Name Date Dose VIS Date Route   Pfizer Covid-19 Vaccine Bivalent Booster 12/08/2021 11:28 AM 0.3 mL 08/03/2021 Intramuscular   Manufacturer: [employer]   Lot: [id number]   [place]: [phone]

## 2021-12-14 ENCOUNTER — Other Ambulatory Visit (HOSPITAL_COMMUNITY): Payer: Self-pay

## 2021-12-14 ENCOUNTER — Other Ambulatory Visit (INDEPENDENT_AMBULATORY_CARE_PROVIDER_SITE_OTHER): Payer: Self-pay | Admitting: Family Medicine

## 2021-12-14 MED ORDER — PEN NEEDLES 32G X 4 MM MISC
0 refills | Status: DC
  Filled 2021-12-14: qty 100, 90d supply, fill #0

## 2021-12-16 ENCOUNTER — Other Ambulatory Visit (HOSPITAL_COMMUNITY): Payer: Self-pay

## 2021-12-21 ENCOUNTER — Ambulatory Visit (INDEPENDENT_AMBULATORY_CARE_PROVIDER_SITE_OTHER): Payer: 59 | Admitting: Family Medicine

## 2021-12-26 ENCOUNTER — Other Ambulatory Visit (HOSPITAL_COMMUNITY): Payer: Self-pay

## 2021-12-26 MED ORDER — ESTRADIOL 0.05 MG/24HR TD PTWK
0.0500 mg | MEDICATED_PATCH | TRANSDERMAL | 2 refills | Status: DC
  Filled 2021-12-26 – 2022-01-17 (×2): qty 12, 84d supply, fill #0
  Filled 2022-04-12: qty 12, 84d supply, fill #1
  Filled 2022-07-03: qty 12, 84d supply, fill #2

## 2021-12-27 ENCOUNTER — Ambulatory Visit (INDEPENDENT_AMBULATORY_CARE_PROVIDER_SITE_OTHER): Payer: 59 | Admitting: Family Medicine

## 2021-12-27 ENCOUNTER — Other Ambulatory Visit (HOSPITAL_COMMUNITY): Payer: Self-pay

## 2021-12-27 ENCOUNTER — Other Ambulatory Visit: Payer: Self-pay

## 2021-12-27 ENCOUNTER — Encounter (INDEPENDENT_AMBULATORY_CARE_PROVIDER_SITE_OTHER): Payer: Self-pay | Admitting: Family Medicine

## 2021-12-27 VITALS — BP 107/70 | HR 81 | Temp 98.3°F | Ht 62.0 in | Wt 164.0 lb

## 2021-12-27 DIAGNOSIS — Z6829 Body mass index (BMI) 29.0-29.9, adult: Secondary | ICD-10-CM

## 2021-12-27 DIAGNOSIS — F3289 Other specified depressive episodes: Secondary | ICD-10-CM

## 2021-12-27 DIAGNOSIS — Z6830 Body mass index (BMI) 30.0-30.9, adult: Secondary | ICD-10-CM

## 2021-12-27 DIAGNOSIS — E8881 Metabolic syndrome: Secondary | ICD-10-CM

## 2021-12-27 DIAGNOSIS — E669 Obesity, unspecified: Secondary | ICD-10-CM

## 2021-12-27 DIAGNOSIS — E559 Vitamin D deficiency, unspecified: Secondary | ICD-10-CM

## 2021-12-27 MED ORDER — SAXENDA 18 MG/3ML ~~LOC~~ SOPN
3.0000 mg | PEN_INJECTOR | Freq: Every day | SUBCUTANEOUS | 0 refills | Status: DC
  Filled 2021-12-27: qty 15, 30d supply, fill #0

## 2021-12-27 MED ORDER — BUPROPION HCL ER (SR) 150 MG PO TB12
150.0000 mg | ORAL_TABLET | Freq: Every morning | ORAL | 0 refills | Status: DC
  Filled 2021-12-27: qty 30, 30d supply, fill #0

## 2021-12-27 NOTE — Progress Notes (Signed)
Chief Complaint:   OBESITY Laura [name] is here to discuss her progress with her obesity treatment plan along with follow-up of her obesity related diagnoses. Laura [name] is on the Category 2 Plan and states she is following her eating plan approximately 95% of the time. Laura [name] states she is doing cardio for 40 minutes 3 times per week.  Today's visit was #: 14 Starting weight: 168 lbs Starting date: 01/20/2021 Today's weight: 164 lbs Today's date: 12/27/2021 Total lbs lost to date: 4 lbs Total lbs lost since last in-office visit: 2 lbs  Interim History: She is adhering to Category 2 well. She reverses her lunch and dinner which works better for her. She is meal prepping consistently and using her crock pot to cook meals. Laura [name] is on Saxenda 0.6 mg daily plus 5 clicks (0.9 mg). She denies constipation or nausea.  Subjective:   1. Insulin resistance Laura [name]'s fasting insulin was elevated at 11.9 on 01-20-2021. She is on Saxenda  0.9 mg daily.  Lab Results  Component Value Date   INSULIN 6.0 09/27/2021   INSULIN 5.8 05/09/2021   INSULIN 11.9 01/20/2021   Lab Results  Component Value Date   HGBA1C 5.3 09/27/2021    2. Other depression with emotional eating Laura [name]'s cravings well controlled on bupropion 150 mg every day. She takes this at 2 pm. She denies insomnia.   Assessment/Plan:   1. Insulin resistance Laura [name] will continue Saxenda. S  2. Other depression with emotional eating We will refill bupropion 150 mg every day.  - buPROPion (WELLBUTRIN SR) 150 MG 12 hr tablet; Take 1 tablet (150 mg total) by mouth in the morning.  Dispense: 30 tablet; Refill: 0  3. Obesity with current BMI of 29.99 Laura [name] is currently in the action stage of change. As such, her goal is to continue with weight loss efforts. She has agreed to the Category 2 Plan.   Laura [name] may increase Saxenda to 1.2 mg.  We will refill Saxenda.  She notes increased hunger with exercise. She will decrease  exercise sessions to 30 minutes per session. She will plan to have snack after exercise.  - Liraglutide -Weight Management (SAXENDA) 18 MG/3ML SOPN; Inject 3 mg into the skin daily.  Dispense: 15 mL; Refill: 0  Exercise goals: For substantial health benefits, adults should do at least 150 minutes (2 hours and 30 minutes) a week of moderate-intensity, or 75 minutes (1 hour and 15 minutes) a week of vigorous-intensity aerobic physical activity, or an equivalent combination of moderate- and vigorous-intensity aerobic activity. Aerobic activity should be performed in episodes of at least 10 minutes, and preferably, it should be spread throughout the week.  Behavioral modification strategies: better snacking choices.  Laura [name] has agreed to follow-up with our clinic in 4 weeks with Dr. Leafy Ro.  Objective:   Blood pressure 107/70, pulse 81, temperature 98.3 F (36.8 C), height 5\' 2"  (1.575 m), weight 164 lb (74.4 kg), SpO2 99 %. Body mass index is 30 kg/m.  General: Cooperative, alert, well developed, in no acute distress. HEENT: Conjunctivae and lids unremarkable. Cardiovascular: Regular rhythm.  Lungs: Normal work of breathing. Neurologic: No focal deficits.   Lab Results  Component Value Date   CREATININE 0.83 09/27/2021   BUN 9 09/27/2021   NA 140 09/27/2021   K 4.6 09/27/2021   CL 103 09/27/2021   CO2 24 09/27/2021   Lab Results  Component Value Date   ALT 19 09/27/2021   AST 16 09/27/2021   ALKPHOS 58  09/27/2021   BILITOT 0.3 09/27/2021   Lab Results  Component Value Date   HGBA1C 5.3 09/27/2021   HGBA1C 5.4 05/09/2021   HGBA1C 5.2 01/20/2021   Lab Results  Component Value Date   INSULIN 6.0 09/27/2021   INSULIN 5.8 05/09/2021   INSULIN 11.9 01/20/2021   Lab Results  Component Value Date   TSH 1.480 01/20/2021   Lab Results  Component Value Date   CHOL 196 09/27/2021   HDL 53 09/27/2021   LDLCALC 132 (H) 09/27/2021   TRIG 60 09/27/2021   CHOLHDL 3.3  11/07/2017   Lab Results  Component Value Date   VD25OH 54.8 09/27/2021   VD25OH 75.5 05/09/2021   VD25OH 34.4 01/20/2021   Lab Results  Component Value Date   WBC 5.4 01/20/2021   HGB 13.7 01/20/2021   HCT 42.2 01/20/2021   MCV 92 01/20/2021   PLT 269 01/20/2021   No results found for: IRON, TIBC, FERRITIN  Attestation Statements:   Reviewed by clinician on day of visit: allergies, medications, problem list, medical history, surgical history, family history, social history, and previous encounter notes.  I, Lizbeth Bark, RMA, am acting as Location manager for [employer], [place].  I have reviewed the above documentation for accuracy and completeness, and I agree with the above. -  Georgianne Fick, FNP

## 2021-12-28 ENCOUNTER — Encounter (INDEPENDENT_AMBULATORY_CARE_PROVIDER_SITE_OTHER): Payer: Self-pay | Admitting: Family Medicine

## 2021-12-30 ENCOUNTER — Other Ambulatory Visit (HOSPITAL_COMMUNITY): Payer: Self-pay

## 2022-01-17 ENCOUNTER — Other Ambulatory Visit (HOSPITAL_COMMUNITY): Payer: Self-pay

## 2022-01-25 ENCOUNTER — Ambulatory Visit (INDEPENDENT_AMBULATORY_CARE_PROVIDER_SITE_OTHER): Payer: 59 | Admitting: Family Medicine

## 2022-01-27 ENCOUNTER — Encounter (INDEPENDENT_AMBULATORY_CARE_PROVIDER_SITE_OTHER): Payer: Self-pay | Admitting: Family Medicine

## 2022-01-31 ENCOUNTER — Ambulatory Visit (INDEPENDENT_AMBULATORY_CARE_PROVIDER_SITE_OTHER): Payer: 59 | Admitting: Family Medicine

## 2022-02-08 ENCOUNTER — Ambulatory Visit (INDEPENDENT_AMBULATORY_CARE_PROVIDER_SITE_OTHER): Payer: 59 | Admitting: Family Medicine

## 2022-02-08 ENCOUNTER — Other Ambulatory Visit (HOSPITAL_COMMUNITY): Payer: Self-pay

## 2022-02-08 ENCOUNTER — Encounter (INDEPENDENT_AMBULATORY_CARE_PROVIDER_SITE_OTHER): Payer: Self-pay | Admitting: Family Medicine

## 2022-02-08 ENCOUNTER — Other Ambulatory Visit: Payer: Self-pay

## 2022-02-08 VITALS — BP 114/71 | HR 82 | Temp 98.0°F | Ht 62.0 in | Wt 163.0 lb

## 2022-02-08 DIAGNOSIS — E559 Vitamin D deficiency, unspecified: Secondary | ICD-10-CM | POA: Diagnosis not present

## 2022-02-08 DIAGNOSIS — E7849 Other hyperlipidemia: Secondary | ICD-10-CM

## 2022-02-08 DIAGNOSIS — E669 Obesity, unspecified: Secondary | ICD-10-CM

## 2022-02-08 DIAGNOSIS — Z6829 Body mass index (BMI) 29.0-29.9, adult: Secondary | ICD-10-CM

## 2022-02-08 DIAGNOSIS — Z9189 Other specified personal risk factors, not elsewhere classified: Secondary | ICD-10-CM

## 2022-02-08 DIAGNOSIS — F3289 Other specified depressive episodes: Secondary | ICD-10-CM

## 2022-02-08 MED ORDER — BUPROPION HCL ER (SR) 150 MG PO TB12
150.0000 mg | ORAL_TABLET | Freq: Every morning | ORAL | 0 refills | Status: DC
  Filled 2022-02-08 – 2022-02-23 (×2): qty 30, 30d supply, fill #0

## 2022-02-09 NOTE — Progress Notes (Signed)
? ? ? ?Chief Complaint:  ? ?OBESITY ?Laura [name] is here to discuss her progress with her obesity treatment plan along with follow-up of her obesity related diagnoses. Laura [name] is on the Category 2 Plan and states she is following her eating plan approximately 95% of the time. Laura [name] states she is doing cardio for 45 minutes, and exercising with a trainer for 30 minutes 3 times per week. ? ?Today's visit was #: 15 ?Starting weight: 168 lbs ?Starting date: 01/20/2021 ?Today's weight: 163 lbs ?Today's date: 02/09/2022 ?Total lbs lost to date: 5 ?Total lbs lost since last in-office visit: 1 ? ?Interim History: Laura [name] continues to do well with weight loss. She is working on meeting her protein goals. She is exercising regularly and she feels well overall. Her BMI is below 30 now. ? ?Subjective:  ? ?1. Vitamin D deficiency ?Laura [name] is on OTC Vit D 2,000 IU daily. Last lab was at goal. ? ?2. Hyperlipidemia, pure ?Laura [name]'s LDL continues to improve, but is not yet at goal.  ? ?3. Other depression with emotional eating ?Laura [name]'s mood is stable lately. She is doing well with decreasing emotional eating behaviors. ? ?4. At risk for impaired metabolic function ?Laura [name] is at increased risk for impaired metabolic function if calories or protein are too low.  ? ?Assessment/Plan:  ? ?1. Vitamin D deficiency ?Laura [name] will continue Vitamin D 2,000 IU every day, and we will recheck labs in 4-5 weeks. She will follow-up for routine testing of Vitamin D, at least 2-3 times per year to avoid over-replacement. ? ?2. Hyperlipidemia, pure ?Cardiovascular risk and specific lipid/LDL goals reviewed. We discussed several lifestyle modifications today. We will recheck labs in 1 month. Laura [name] will continue to work on diet and exercise. Orders and follow up as documented in patient record.  ? ?3. Other depression with emotional eating ?We will refill Wellbutrin SR for 1 month. Behavior modification techniques were discussed today to help Laura [name]  deal with her emotional/non-hunger eating behaviors. Orders and follow up as documented in patient record.  ? ?- buPROPion (WELLBUTRIN SR) 150 MG 12 hr tablet; Take 1 tablet (150 mg total) by mouth in the morning.  Dispense: 30 tablet; Refill: 0 ? ?4. At risk for impaired metabolic function ?Laura [name] was given approximately 15 minutes of impaired  metabolic function prevention counseling today. We discussed intensive lifestyle modifications today with an emphasis on specific nutrition and exercise instructions and strategies.  ? ?Repetitive spaced learning was employed today to elicit superior memory formation and behavioral change. ? ?5. Obesity with current BMI of 29.9 ?Laura [name] is currently in the action stage of change. As such, her goal is to continue with weight loss efforts. She has agreed to the Category 2 Plan.  ? ?We discussed weight and health goals based not on total body weight, but on visceral fat ratings, fat %, and muscle mass. She is very close to a healthy weight for her especially with her higher than average muscle mass. ? ?We will recheck fasting labs at her next visit. ? ?Exercise goals: As is. ? ?Behavioral modification strategies: increasing lean protein intake and meal planning and cooking strategies. ? ?Laura [name] has agreed to follow-up with our clinic in 4 to 5 weeks. She was informed of the importance of frequent follow-up visits to maximize her success with intensive lifestyle modifications for her multiple health conditions.  ? ?Objective:  ? ?Blood pressure 114/71, pulse 82, temperature 98 ?F (36.7 ?C), height '5\' 2"'$  (1.575 m), weight 163 lb (73.9 kg), SpO2  99 %. ?Body mass index is 29.81 kg/m?. ? ?General: Cooperative, alert, well developed, in no acute distress. ?HEENT: Conjunctivae and lids unremarkable. ?Cardiovascular: Regular rhythm.  ?Lungs: Normal work of breathing. ?Neurologic: No focal deficits.  ? ?Lab Results  ?Component Value Date  ? CREATININE 0.83 09/27/2021  ? BUN 9  09/27/2021  ? NA 140 09/27/2021  ? K 4.6 09/27/2021  ? CL 103 09/27/2021  ? CO2 24 09/27/2021  ? ?Lab Results  ?Component Value Date  ? ALT 19 09/27/2021  ? AST 16 09/27/2021  ? ALKPHOS 58 09/27/2021  ? BILITOT 0.3 09/27/2021  ? ?Lab Results  ?Component Value Date  ? HGBA1C 5.3 09/27/2021  ? HGBA1C 5.4 05/09/2021  ? HGBA1C 5.2 01/20/2021  ? ?Lab Results  ?Component Value Date  ? INSULIN 6.0 09/27/2021  ? INSULIN 5.8 05/09/2021  ? INSULIN 11.9 01/20/2021  ? ?Lab Results  ?Component Value Date  ? TSH 1.480 01/20/2021  ? ?Lab Results  ?Component Value Date  ? CHOL 196 09/27/2021  ? HDL 53 09/27/2021  ? LDLCALC 132 (H) 09/27/2021  ? TRIG 60 09/27/2021  ? CHOLHDL 3.3 11/07/2017  ? ?Lab Results  ?Component Value Date  ? VD25OH 54.8 09/27/2021  ? VD25OH 75.5 05/09/2021  ? VD25OH 34.4 01/20/2021  ? ?Lab Results  ?Component Value Date  ? WBC 5.4 01/20/2021  ? HGB 13.7 01/20/2021  ? HCT 42.2 01/20/2021  ? MCV 92 01/20/2021  ? PLT 269 01/20/2021  ? ?No results found for: IRON, TIBC, FERRITIN ? ?Attestation Statements:  ? ?Reviewed by clinician on day of visit: allergies, medications, problem list, medical history, surgical history, family history, social history, and previous encounter notes. ? ? ?I, Trixie Dredge, am acting as transcriptionist for Dennard Nip, MD. ? ?I have reviewed the above documentation for accuracy and completeness, and I agree with the above. -  Dennard Nip, MD ? ? ?

## 2022-02-16 ENCOUNTER — Other Ambulatory Visit (HOSPITAL_COMMUNITY): Payer: Self-pay

## 2022-02-23 ENCOUNTER — Other Ambulatory Visit (HOSPITAL_COMMUNITY): Payer: Self-pay

## 2022-02-24 ENCOUNTER — Other Ambulatory Visit (HOSPITAL_COMMUNITY): Payer: Self-pay

## 2022-03-14 ENCOUNTER — Encounter (INDEPENDENT_AMBULATORY_CARE_PROVIDER_SITE_OTHER): Payer: Self-pay | Admitting: Family Medicine

## 2022-03-14 ENCOUNTER — Ambulatory Visit (INDEPENDENT_AMBULATORY_CARE_PROVIDER_SITE_OTHER): Payer: 59 | Admitting: Family Medicine

## 2022-03-14 VITALS — BP 105/69 | HR 89 | Temp 98.2°F | Ht 62.0 in | Wt 162.0 lb

## 2022-03-14 DIAGNOSIS — E8881 Metabolic syndrome: Secondary | ICD-10-CM

## 2022-03-14 DIAGNOSIS — E782 Mixed hyperlipidemia: Secondary | ICD-10-CM | POA: Diagnosis not present

## 2022-03-14 DIAGNOSIS — E669 Obesity, unspecified: Secondary | ICD-10-CM

## 2022-03-14 DIAGNOSIS — Z6829 Body mass index (BMI) 29.0-29.9, adult: Secondary | ICD-10-CM | POA: Diagnosis not present

## 2022-03-14 DIAGNOSIS — E559 Vitamin D deficiency, unspecified: Secondary | ICD-10-CM | POA: Diagnosis not present

## 2022-03-14 DIAGNOSIS — Z9189 Other specified personal risk factors, not elsewhere classified: Secondary | ICD-10-CM

## 2022-03-15 LAB — CMP14+EGFR
ALT: 21 IU/L (ref 0–32)
AST: 12 IU/L (ref 0–40)
Albumin/Globulin Ratio: 1.6 (ref 1.2–2.2)
Albumin: 4.6 g/dL (ref 3.8–4.9)
Alkaline Phosphatase: 53 IU/L (ref 44–121)
BUN/Creatinine Ratio: 18 (ref 9–23)
BUN: 14 mg/dL (ref 6–24)
Bilirubin Total: 0.3 mg/dL (ref 0.0–1.2)
CO2: 23 mmol/L (ref 20–29)
Calcium: 10 mg/dL (ref 8.7–10.2)
Chloride: 104 mmol/L (ref 96–106)
Creatinine, Ser: 0.8 mg/dL (ref 0.57–1.00)
Globulin, Total: 2.8 g/dL (ref 1.5–4.5)
Glucose: 78 mg/dL (ref 70–99)
Potassium: 4.3 mmol/L (ref 3.5–5.2)
Sodium: 142 mmol/L (ref 134–144)
Total Protein: 7.4 g/dL (ref 6.0–8.5)
eGFR: 86 mL/min/{1.73_m2} (ref >59)

## 2022-03-15 LAB — VITAMIN D 25 HYDROXY (VIT D DEFICIENCY, FRACTURES): Vit D, 25-Hydroxy: 30.6 ng/mL (ref 30.0–100.0)

## 2022-03-15 LAB — INSULIN, RANDOM: INSULIN: 5.3 u[IU]/mL (ref 2.6–24.9)

## 2022-03-15 LAB — LIPID PANEL WITH LDL/HDL RATIO
Cholesterol, Total: 219 mg/dL — ABNORMAL HIGH (ref 100–199)
HDL: 60 mg/dL (ref >39)
LDL Chol Calc (NIH): 149 mg/dL — ABNORMAL HIGH (ref 0–99)
LDL/HDL Ratio: 2.5 ratio (ref 0.0–3.2)
Triglycerides: 57 mg/dL (ref 0–149)
VLDL Cholesterol Cal: 10 mg/dL (ref 5–40)

## 2022-03-15 LAB — HEMOGLOBIN A1C
Est. average glucose Bld gHb Est-mCnc: 108 mg/dL
Hgb A1c MFr Bld: 5.4 % (ref 4.8–5.6)

## 2022-03-16 NOTE — Progress Notes (Signed)
? ? ? ?Chief Complaint:  ? ?OBESITY ?Laura [name] is here to discuss her progress with her obesity treatment plan along with follow-up of her obesity related diagnoses. Davie is on the Category 2 Plan and states she is following her eating plan approximately 90% of the time. Erricka states she is doing cardio and weight training for 45 minutes 3-4 times per week. ? ?Today's visit was #: 16 ?Starting weight: 168 lbs ?Starting date: 01/20/2021 ?Today's weight: 162 lbs ?Today's date: 03/14/2022 ?Total lbs lost to date: 6 ?Total lbs lost since last in-office visit: 1 ? ?Interim History: Laura [name] continues to do well with weight loss, even over Easter. She is starting to get bored with her plan and would like more options.  ? ?Subjective:  ? ?1. Vitamin D deficiency ?Laura [name] is on Vitamin D, and she is due for labs to monitor progress.  ? ?2. Mixed hyperlipidemia ?Laura [name] is not on statin, and she is working on decreased cholesterol in diet and is due for labs.  ? ?3. Insulin resistance ?Laura [name] is stable on GLP-1, and she is working on her diet and weight loss.  ? ?4. At risk for heart disease ?Laura [name] is at higher than average risk for cardiovascular disease due to obesity. ? ?Assessment/Plan:  ? ?1. Vitamin D deficiency ?We will check labs today, and we will refill prescription Vitamin D 50,000 IU every 14 days #2 for 1 month. Honor will follow-up for routine testing of Vitamin D, at least 2-3 times per year to avoid over-replacement. ? ?- VITAMIN D 25 Hydroxy (Vit-D Deficiency, Fractures) ? ?2. Mixed hyperlipidemia ?Cardiovascular risk and specific lipid/LDL goals reviewed.  We discussed several lifestyle modifications today. We will check labs today. Janyce will continue to work on diet, exercise and weight loss efforts. Orders and follow up as documented in patient record.  ? ?- Lipid Panel With LDL/HDL Ratio ? ?3. Insulin resistance ?We will check labs today. Shukri will continue her GLP-1, and continue diet,  exercise, and weight loss to help decrease the risk of diabetes. Astraea agreed to follow-up with [name] as directed to closely monitor her progress. ? ?- CMP14+EGFR ?- Hemoglobin A1c ?- Insulin, random ? ?4. At risk for heart disease ?Laura [name] was given approximately 15 minutes of coronary artery disease prevention counseling today. She is 56 y.o. female and has risk factors for heart disease including obesity. We discussed intensive lifestyle modifications today with an emphasis on specific weight loss instructions and strategies. ? ?Repetitive spaced learning was employed today to elicit superior memory formation and behavioral change.  ? ?5. Obesity with current BMI of 29.6 ?Laura [name] is currently in the action stage of change. As such, her goal is to continue with weight loss efforts. She has agreed to the Category 2 Plan with breakfast options and lean meat equivalent handout was given.  ? ?We will refill Saxenda for 1 month.  ? ?Exercise goals: As is.  ? ?Behavioral modification strategies: increasing lean protein intake. ? ?Laura [name] has agreed to follow-up with our clinic in 4 weeks. She was informed of the importance of frequent follow-up visits to maximize her success with intensive lifestyle modifications for her multiple health conditions.  ? ?Laura [name] was informed we would discuss her lab results at her next visit unless there is a critical issue that needs to be addressed sooner. Laura [name] agreed to keep her next visit at the agreed upon time to discuss these results. ? ?Objective:  ? ?Blood pressure 105/69, pulse 89, temperature 98.2 ?F (36.8 ?  C), height _0  (1.575 m), weight 162 lb (73.5 kg), SpO2 97 %. ?Body mass index is 29.63 kg/m?. ? ?General: Cooperative, alert, well developed, in no acute distress. ?HEENT: Conjunctivae and lids unremarkable. ?Cardiovascular: Regular rhythm.  ?Lungs: Normal work of breathing. ?Neurologic: No focal deficits.  ? ?Lab Results  ?Component Value Date  ? CREATININE 0.80  03/14/2022  ? BUN 14 03/14/2022  ? NA 142 03/14/2022  ? K 4.3 03/14/2022  ? CL 104 03/14/2022  ? CO2 23 03/14/2022  ? ?Lab Results  ?Component Value Date  ? ALT 21 03/14/2022  ? AST 12 03/14/2022  ? ALKPHOS 53 03/14/2022  ? BILITOT 0.3 03/14/2022  ? ?Lab Results  ?Component Value Date  ? HGBA1C 5.4 03/14/2022  ? HGBA1C 5.3 09/27/2021  ? HGBA1C 5.4 05/09/2021  ? HGBA1C 5.2 01/20/2021  ? ?Lab Results  ?Component Value Date  ? INSULIN 5.3 03/14/2022  ? INSULIN 6.0 09/27/2021  ? INSULIN 5.8 05/09/2021  ? INSULIN 11.9 01/20/2021  ? ?Lab Results  ?Component Value Date  ? TSH 1.480 01/20/2021  ? ?Lab Results  ?Component Value Date  ? CHOL 219 (H) 03/14/2022  ? HDL 60 03/14/2022  ? LDLCALC 149 (H) 03/14/2022  ? TRIG 57 03/14/2022  ? CHOLHDL 3.3 11/07/2017  ? ?Lab Results  ?Component Value Date  ? VD25OH 30.6 03/14/2022  ? VD25OH 54.8 09/27/2021  ? VD25OH 75.5 05/09/2021  ? ?Lab Results  ?Component Value Date  ? WBC 5.4 01/20/2021  ? HGB 13.7 01/20/2021  ? HCT 42.2 01/20/2021  ? MCV 92 01/20/2021  ? PLT 269 01/20/2021  ? ?No results found for: IRON, TIBC, FERRITIN ? ?Attestation Statements:  ? ?Reviewed by clinician on day of visit: allergies, medications, problem list, medical history, surgical history, family history, social history, and previous encounter notes. ? ? ?I, Trixie Dredge, am acting as transcriptionist for Dennard Nip, MD. ? ?I have reviewed the above documentation for accuracy and completeness, and I agree with the above. -  Dennard Nip, MD ? ? ?

## 2022-03-20 ENCOUNTER — Other Ambulatory Visit (HOSPITAL_COMMUNITY): Payer: Self-pay

## 2022-03-20 MED ORDER — VITAMIN D (ERGOCALCIFEROL) 1.25 MG (50000 UNIT) PO CAPS
50000.0000 [IU] | ORAL_CAPSULE | ORAL | 0 refills | Status: DC
  Filled 2022-03-20: qty 2, 28d supply, fill #0

## 2022-03-20 MED ORDER — SAXENDA 18 MG/3ML ~~LOC~~ SOPN
3.0000 mg | PEN_INJECTOR | Freq: Every day | SUBCUTANEOUS | 0 refills | Status: DC
  Filled 2022-03-20: qty 15, 30d supply, fill #0

## 2022-03-28 ENCOUNTER — Other Ambulatory Visit (HOSPITAL_COMMUNITY): Payer: Self-pay

## 2022-04-11 ENCOUNTER — Encounter (INDEPENDENT_AMBULATORY_CARE_PROVIDER_SITE_OTHER): Payer: Self-pay | Admitting: Family Medicine

## 2022-04-11 ENCOUNTER — Other Ambulatory Visit (HOSPITAL_COMMUNITY): Payer: Self-pay

## 2022-04-11 ENCOUNTER — Ambulatory Visit (INDEPENDENT_AMBULATORY_CARE_PROVIDER_SITE_OTHER): Payer: 59 | Admitting: Family Medicine

## 2022-04-11 VITALS — BP 100/65 | HR 87 | Temp 98.1°F | Ht 62.0 in | Wt 161.0 lb

## 2022-04-11 DIAGNOSIS — Z6829 Body mass index (BMI) 29.0-29.9, adult: Secondary | ICD-10-CM | POA: Diagnosis not present

## 2022-04-11 DIAGNOSIS — F4321 Adjustment disorder with depressed mood: Secondary | ICD-10-CM

## 2022-04-11 DIAGNOSIS — E7849 Other hyperlipidemia: Secondary | ICD-10-CM | POA: Diagnosis not present

## 2022-04-11 DIAGNOSIS — Z9189 Other specified personal risk factors, not elsewhere classified: Secondary | ICD-10-CM

## 2022-04-11 DIAGNOSIS — E559 Vitamin D deficiency, unspecified: Secondary | ICD-10-CM

## 2022-04-11 DIAGNOSIS — E669 Obesity, unspecified: Secondary | ICD-10-CM

## 2022-04-11 MED ORDER — PEN NEEDLES 32G X 4 MM MISC
0 refills | Status: DC
  Filled 2022-04-11: qty 100, 90d supply, fill #0

## 2022-04-11 MED ORDER — VITAMIN D (ERGOCALCIFEROL) 1.25 MG (50000 UNIT) PO CAPS
50000.0000 [IU] | ORAL_CAPSULE | ORAL | 0 refills | Status: DC
  Filled 2022-04-11: qty 6, 84d supply, fill #0
  Filled 2022-06-28: qty 2, 28d supply, fill #1

## 2022-04-11 MED ORDER — SAXENDA 18 MG/3ML ~~LOC~~ SOPN
3.0000 mg | PEN_INJECTOR | Freq: Every day | SUBCUTANEOUS | 0 refills | Status: DC
  Filled 2022-04-11: qty 15, 30d supply, fill #0

## 2022-04-12 ENCOUNTER — Other Ambulatory Visit (HOSPITAL_COMMUNITY): Payer: Self-pay

## 2022-04-13 ENCOUNTER — Other Ambulatory Visit (HOSPITAL_COMMUNITY): Payer: Self-pay

## 2022-04-17 ENCOUNTER — Telehealth (INDEPENDENT_AMBULATORY_CARE_PROVIDER_SITE_OTHER): Payer: Self-pay | Admitting: Family Medicine

## 2022-04-17 ENCOUNTER — Encounter (INDEPENDENT_AMBULATORY_CARE_PROVIDER_SITE_OTHER): Payer: Self-pay

## 2022-04-17 NOTE — Telephone Encounter (Signed)
Dr. Leafy Ro - Prior authorization approved for Saxenda. Effective: 04/20/2022 to 04/20/2023. Patient sent approval message via mychart.  ?

## 2022-04-17 NOTE — Telephone Encounter (Signed)
Dr.Beasley 

## 2022-04-26 NOTE — Progress Notes (Signed)
Chief Complaint:   OBESITY Laura [name] is here to discuss her progress with her obesity treatment plan along with follow-up of her obesity related diagnoses. Laura [name] is on the Category 2 Plan with breakfast options and states she is following her eating plan approximately 95% of the time. Laura [name] states she is doing 0 minutes 0 times per week.  Today's visit was #: 17 Starting weight: 168 lbs Starting date: 01/20/2021 Today's weight: 161 lbs Today's date: 04/11/2022 Total lbs lost to date: 7 Total lbs lost since last in-office visit: 1  Interim History: Laura [name] has been doing well with weight loss. She is dealing  with a lot of stress dealing with her mother's recent death. Her hunger is controlled, but meal planning has been difficult.   Subjective:   1. Grieving Laura [name]'s mother died from pneumonia recently. She is dealing with her grief reasonably well and not neglecting to take care of herself.   2. Vitamin D deficiency Laura [name]'s Vitamin D level is worsening and below goal. She is off all Vitamin D now. I discussed labs with the patient today.  3. Hyperlipidemia, pure Laura [name]'s LDL remains elevated despite weight loss and decrease in high cholesterol foods. She denies chest pain. I discussed labs with the patient today.  4. At risk for heart disease Laura [name] is at higher than average risk for cardiovascular disease due to obesity.  Assessment/Plan:   1. Grieving Healthy grieving strategies were discussed with [name] today, and we will continue to monitor.   2. Vitamin D deficiency Laura [name] agreed to restart prescription Vitamin D 50,000 IU every 14 days with a 90 day supply, with no refills.  - Vitamin D, Ergocalciferol, (DRISDOL) 1.25 MG (50000 UNIT) CAPS capsule; Take 1 capsule (50,000 Units total) by mouth every 14 (fourteen) days.  Dispense: 8 capsule; Refill: 0  3. Hyperlipidemia, pure Laura [name] is to discuss the need for statins at her next visit. She will continue her  diet and exercise in the meanwhile.   4. At risk for heart disease Laura [name] was given approximately 15 minutes of coronary artery disease prevention counseling today. She is 56 y.o. female and has risk factors for heart disease including obesity. We discussed intensive lifestyle modifications today with an emphasis on specific weight loss instructions and strategies.  Repetitive spaced learning was employed today to elicit superior memory formation and behavioral change.   5. Obesity, Current BMI 29.6 Laura [name] is currently in the action stage of change. As such, her goal is to continue with weight loss efforts. She has agreed to the Category 2 Plan with breakfast options.   We discussed various medication options to help Laura [name] with her weight loss efforts and we both agreed to continue Saxenda, and we will refill for 1 month and will refill pen needles #100 with no refills.  - Liraglutide -Weight Management (SAXENDA) 18 MG/3ML SOPN; Inject 3 mg into the skin daily.  Dispense: 15 mL; Refill: 0 - Insulin Pen Needle (PEN NEEDLES) 32G X 4 MM MISC; Use as directed with saxenda.  Dispense: 100 each; Refill: 0  Behavioral modification strategies: increasing lean protein intake and meal planning and cooking strategies.  [name] has agreed to follow-up with our clinic in 3 to 4 weeks. She was informed of the importance of frequent follow-up visits to maximize her success with intensive lifestyle modifications for her multiple health conditions.   Objective:   Blood pressure 100/65, pulse 87, temperature 98.1 F (36.7 C), height '5\' 2"'$  (1.575 m), weight 161  lb (73 kg), SpO2 98 %. Body mass index is 29.45 kg/m.  General: Cooperative, alert, well developed, in no acute distress. HEENT: Conjunctivae and lids unremarkable. Cardiovascular: Regular rhythm.  Lungs: Normal work of breathing. Neurologic: No focal deficits.   Lab Results  Component Value Date   CREATININE 0.80 03/14/2022   BUN 14  03/14/2022   NA 142 03/14/2022   K 4.3 03/14/2022   CL 104 03/14/2022   CO2 23 03/14/2022   Lab Results  Component Value Date   ALT 21 03/14/2022   AST 12 03/14/2022   ALKPHOS 53 03/14/2022   BILITOT 0.3 03/14/2022   Lab Results  Component Value Date   HGBA1C 5.4 03/14/2022   HGBA1C 5.3 09/27/2021   HGBA1C 5.4 05/09/2021   HGBA1C 5.2 01/20/2021   Lab Results  Component Value Date   INSULIN 5.3 03/14/2022   INSULIN 6.0 09/27/2021   INSULIN 5.8 05/09/2021   INSULIN 11.9 01/20/2021   Lab Results  Component Value Date   TSH 1.480 01/20/2021   Lab Results  Component Value Date   CHOL 219 (H) 03/14/2022   HDL 60 03/14/2022   LDLCALC 149 (H) 03/14/2022   TRIG 57 03/14/2022   CHOLHDL 3.3 11/07/2017   Lab Results  Component Value Date   VD25OH 30.6 03/14/2022   VD25OH 54.8 09/27/2021   VD25OH 75.5 05/09/2021   Lab Results  Component Value Date   WBC 5.4 01/20/2021   HGB 13.7 01/20/2021   HCT 42.2 01/20/2021   MCV 92 01/20/2021   PLT 269 01/20/2021   No results found for: IRON, TIBC, FERRITIN  Attestation Statements:   Reviewed by clinician on day of visit: allergies, medications, problem list, medical history, surgical history, family history, social history, and previous encounter notes.   I, Trixie Dredge, am acting as transcriptionist for Dennard Nip, MD.  I have reviewed the above documentation for accuracy and completeness, and I agree with the above. -  Dennard Nip, MD

## 2022-05-08 DIAGNOSIS — E7800 Pure hypercholesterolemia, unspecified: Secondary | ICD-10-CM | POA: Diagnosis not present

## 2022-05-09 ENCOUNTER — Encounter (INDEPENDENT_AMBULATORY_CARE_PROVIDER_SITE_OTHER): Payer: Self-pay | Admitting: Family Medicine

## 2022-05-09 ENCOUNTER — Ambulatory Visit (INDEPENDENT_AMBULATORY_CARE_PROVIDER_SITE_OTHER): Payer: 59 | Admitting: Family Medicine

## 2022-05-09 VITALS — BP 100/62 | HR 89 | Temp 98.2°F | Ht 62.0 in | Wt 162.0 lb

## 2022-05-09 DIAGNOSIS — Z6829 Body mass index (BMI) 29.0-29.9, adult: Secondary | ICD-10-CM | POA: Diagnosis not present

## 2022-05-09 DIAGNOSIS — E669 Obesity, unspecified: Secondary | ICD-10-CM | POA: Diagnosis not present

## 2022-05-09 DIAGNOSIS — E8881 Metabolic syndrome: Secondary | ICD-10-CM

## 2022-05-11 NOTE — Progress Notes (Signed)
Chief Complaint:   OBESITY Laura [name] is here to discuss her progress with her obesity treatment plan along with follow-up of her obesity related diagnoses. Laura [name] is on the Category 2 Plan with breakfast options and states she is following her eating plan approximately 95% of the time. Laura [name] states she is doing 0 minutes 0 times per week.  Today's visit was #: 18 Starting weight: 168 lbs Starting date: 01/20/2021 Today's weight: 162 lbs Today's date: 05/09/2022 Total lbs lost to date: 6 Total lbs lost since last in-office visit: 0  Interim History: Laura [name] continues to work on her eating plan. She is trying to meet her protein goals most days. She is not yet exercising which could be decreasing her RMR. She increased her Saxenda to 1.8 mg approximately 2 weeks ago with no nausea or vomiting.   Subjective:   1. Insulin resistance Laura [name] continues to work on her diet and weight loss. She is stable on her GLP-1 with no nausea or vomiting.  Assessment/Plan:   1. Insulin resistance Laura [name] will continue her diet, exercise and medications as is, and we will recheck labs in 2 months. Laura [name] agreed to follow-up with [name] as directed to closely monitor her progress.  2. Obesity, Current BMI 29.7 Laura [name] is currently in the action stage of change. As such, her goal is to continue with weight loss efforts. She has agreed to the Category 2 Plan.   Exercise goals: Add chair yoga exercises for 15 minutes 3 times per week to start.   Behavioral modification strategies: increasing lean protein intake.  Laura [name] has agreed to follow-up with our clinic in 3 to 4 weeks. She was informed of the importance of frequent follow-up visits to maximize her success with intensive lifestyle modifications for her multiple health conditions.   Objective:   Blood pressure 100/62, pulse 89, temperature 98.2 F (36.8 C), height '5\' 2"'$  (1.575 m), weight 162 lb (73.5 kg), SpO2 97 %. Body mass index is 29.63  kg/m.  General: Cooperative, alert, well developed, in no acute distress. HEENT: Conjunctivae and lids unremarkable. Cardiovascular: Regular rhythm.  Lungs: Normal work of breathing. Neurologic: No focal deficits.   Lab Results  Component Value Date   CREATININE 0.80 03/14/2022   BUN 14 03/14/2022   NA 142 03/14/2022   K 4.3 03/14/2022   CL 104 03/14/2022   CO2 23 03/14/2022   Lab Results  Component Value Date   ALT 21 03/14/2022   AST 12 03/14/2022   ALKPHOS 53 03/14/2022   BILITOT 0.3 03/14/2022   Lab Results  Component Value Date   HGBA1C 5.4 03/14/2022   HGBA1C 5.3 09/27/2021   HGBA1C 5.4 05/09/2021   HGBA1C 5.2 01/20/2021   Lab Results  Component Value Date   INSULIN 5.3 03/14/2022   INSULIN 6.0 09/27/2021   INSULIN 5.8 05/09/2021   INSULIN 11.9 01/20/2021   Lab Results  Component Value Date   TSH 1.480 01/20/2021   Lab Results  Component Value Date   CHOL 219 (H) 03/14/2022   HDL 60 03/14/2022   LDLCALC 149 (H) 03/14/2022   TRIG 57 03/14/2022   CHOLHDL 3.3 11/07/2017   Lab Results  Component Value Date   VD25OH 30.6 03/14/2022   VD25OH 54.8 09/27/2021   VD25OH 75.5 05/09/2021   Lab Results  Component Value Date   WBC 5.4 01/20/2021   HGB 13.7 01/20/2021   HCT 42.2 01/20/2021   MCV 92 01/20/2021   PLT 269 01/20/2021   No  results found for: IRON, TIBC, FERRITIN  Attestation Statements:   Reviewed by clinician on day of visit: allergies, medications, problem list, medical history, surgical history, family history, social history, and previous encounter notes.   I, Trixie Dredge, am acting as transcriptionist for Dennard Nip, MD.  I have reviewed the above documentation for accuracy and completeness, and I agree with the above. -  Dennard Nip, MD

## 2022-06-07 ENCOUNTER — Encounter (INDEPENDENT_AMBULATORY_CARE_PROVIDER_SITE_OTHER): Payer: Self-pay | Admitting: Family Medicine

## 2022-06-07 ENCOUNTER — Ambulatory Visit (INDEPENDENT_AMBULATORY_CARE_PROVIDER_SITE_OTHER): Payer: 59 | Admitting: Family Medicine

## 2022-06-07 VITALS — BP 102/70 | HR 88 | Temp 98.4°F | Ht 62.0 in | Wt 161.8 lb

## 2022-06-07 DIAGNOSIS — Z780 Asymptomatic menopausal state: Secondary | ICD-10-CM | POA: Diagnosis not present

## 2022-06-07 DIAGNOSIS — Z6829 Body mass index (BMI) 29.0-29.9, adult: Secondary | ICD-10-CM

## 2022-06-07 DIAGNOSIS — Z9189 Other specified personal risk factors, not elsewhere classified: Secondary | ICD-10-CM

## 2022-06-07 DIAGNOSIS — E669 Obesity, unspecified: Secondary | ICD-10-CM | POA: Diagnosis not present

## 2022-06-07 DIAGNOSIS — E559 Vitamin D deficiency, unspecified: Secondary | ICD-10-CM | POA: Diagnosis not present

## 2022-06-07 DIAGNOSIS — E66811 Obesity, class 1: Secondary | ICD-10-CM

## 2022-06-08 ENCOUNTER — Other Ambulatory Visit (HOSPITAL_COMMUNITY): Payer: Self-pay

## 2022-06-08 MED ORDER — SAXENDA 18 MG/3ML ~~LOC~~ SOPN
3.0000 mg | PEN_INJECTOR | Freq: Every day | SUBCUTANEOUS | 0 refills | Status: DC
  Filled 2022-06-08: qty 15, 30d supply, fill #0

## 2022-06-08 NOTE — Progress Notes (Signed)
Chief Complaint:   OBESITY Laura [name] is here to discuss her progress with her obesity treatment plan along with follow-up of her obesity related diagnoses. Laura [name] is on the Category 2 Plan and states she is following her eating plan approximately 90% of the time. Laura [name] states she is doing 0 minutes 0 times per week.  Today's visit was #: 45 Starting weight: 168 lbs Starting date: 01/20/2021 Today's weight: 161 lbs Today's date: 06/07/2022 Total lbs lost to date: 7 Total lbs lost since last in-office visit: 1  Interim History: Laura [name] continues to do well with weight loss.  Her hunger is mostly controlled, but she is working on not skipping meals.  She denies side effects with Saxenda, and she increased her dose to 2.4 mg and she is tolerating this dose well.  She denies nausea or vomiting.  Subjective:   1. Vitamin D deficiency Laura [name] is on vitamin D OTC, and she is due to have labs done soon.  2. Post-menopausal Laura [name] is doing well on her Estradiol patches with minimal hot flashes or night sweats.  She continues to work on her diet.  3. At risk for impaired metabolic function Laura [name] is at increased risk for impaired metabolic function due to current nutrition and muscle mass.  Assessment/Plan:   1. Vitamin D deficiency Aeris will continue vitamin D OTC 2000 units daily, and we will recheck labs in 1 month.  2. Post-menopausal Laura [name] was encouraged to continue her diet and increase her exercise to help counteract postmenopausal muscle mass loss.  3. At risk for impaired metabolic function Laura [name] was given approximately 15 minutes of impaired  metabolic function prevention counseling today. We discussed intensive lifestyle modifications today with an emphasis on specific nutrition and exercise instructions and strategies.   Repetitive spaced learning was employed today to elicit superior memory formation and behavioral change.  4. Obesity, Current BMI 29.6 Laura [name]  is currently in the action stage of change. As such, her goal is to continue with weight loss efforts. She has agreed to the Category 2 Plan.   We discussed various medication options to help Laura [name] with her weight loss efforts and we both agreed to continue Saxenda at 2.4 mg daily, and we will refill for 1 month.  Behavioral modification strategies: decreasing simple carbohydrates.  Laura [name] has agreed to follow-up with our clinic in 3 to 4 weeks. She was informed of the importance of frequent follow-up visits to maximize her success with intensive lifestyle modifications for her multiple health conditions.   Objective:   Blood pressure 102/70, pulse 88, temperature 98.4 F (36.9 C), height '5\' 2"'$  (1.575 m), weight 161 lb 12.8 oz (73.4 kg), SpO2 99 %. Body mass index is 29.59 kg/m.  General: Cooperative, alert, well developed, in no acute distress. HEENT: Conjunctivae and lids unremarkable. Cardiovascular: Regular rhythm.  Lungs: Normal work of breathing. Neurologic: No focal deficits.   Lab Results  Component Value Date   CREATININE 0.80 03/14/2022   BUN 14 03/14/2022   NA 142 03/14/2022   K 4.3 03/14/2022   CL 104 03/14/2022   CO2 23 03/14/2022   Lab Results  Component Value Date   ALT 21 03/14/2022   AST 12 03/14/2022   ALKPHOS 53 03/14/2022   BILITOT 0.3 03/14/2022   Lab Results  Component Value Date   HGBA1C 5.4 03/14/2022   HGBA1C 5.3 09/27/2021   HGBA1C 5.4 05/09/2021   HGBA1C 5.2 01/20/2021   Lab Results  Component Value Date  INSULIN 5.3 03/14/2022   INSULIN 6.0 09/27/2021   INSULIN 5.8 05/09/2021   INSULIN 11.9 01/20/2021   Lab Results  Component Value Date   TSH 1.480 01/20/2021   Lab Results  Component Value Date   CHOL 219 (H) 03/14/2022   HDL 60 03/14/2022   LDLCALC 149 (H) 03/14/2022   TRIG 57 03/14/2022   CHOLHDL 3.3 11/07/2017   Lab Results  Component Value Date   VD25OH 30.6 03/14/2022   VD25OH 54.8 09/27/2021   VD25OH 75.5  05/09/2021   Lab Results  Component Value Date   WBC 5.4 01/20/2021   HGB 13.7 01/20/2021   HCT 42.2 01/20/2021   MCV 92 01/20/2021   PLT 269 01/20/2021   No results found for: "IRON", "TIBC", "FERRITIN"  Attestation Statements:   Reviewed by clinician on day of visit: allergies, medications, problem list, medical history, surgical history, family history, social history, and previous encounter notes.   I, Trixie Dredge, am acting as transcriptionist for Dennard Nip, MD.  I have reviewed the above documentation for accuracy and completeness, and I agree with the above. -  Dennard Nip, MD

## 2022-06-09 ENCOUNTER — Other Ambulatory Visit (HOSPITAL_COMMUNITY): Payer: Self-pay

## 2022-06-23 ENCOUNTER — Ambulatory Visit: Payer: 59 | Admitting: Podiatry

## 2022-06-23 DIAGNOSIS — Z79899 Other long term (current) drug therapy: Secondary | ICD-10-CM

## 2022-06-23 DIAGNOSIS — B351 Tinea unguium: Secondary | ICD-10-CM

## 2022-06-23 NOTE — Progress Notes (Signed)
Subjective:  Patient ID: Laura [name], female    DOB: [birth year],  MRN: [id number]  Chief Complaint  Patient presents with   toe nail discoloration    Patient states that she has had toe nail discoloration for one year (bilateral), also the patient states that she broke her right ankle in 2020 and wants to know if her ankle will always appear swollen, no pain     56 y.o. female presents with the above complaint.  Patient presents with complaint bilateral hallux nail discoloration has been on for quite some time has progressive gotten worse.  She wanted get it evaluated she wanted to discuss treatment options for it.  She does not cause her pain.  She has tried over-the-counter topical medication which has not helped.  She wanted to discuss other treatment options for this.  She has not seen anyone else prior to seeing me.   Review of Systems: Negative except as noted in the HPI. Denies N/V/F/Ch.  Past Medical History:  Diagnosis Date   Ankle fracture    Right   Anxiety    ASCUS favor benign 2004   colposcopy negative   Depression    Lack of motivation    Other fatigue    Shortness of breath on exertion    Vertigo    on occasion     Current Outpatient Medications:    buPROPion (WELLBUTRIN SR) 150 MG 12 hr tablet, Take 1 tablet (150 mg total) by mouth in the morning., Disp: 30 tablet, Rfl: 0   Cholecalciferol (VITAMIN D3) 50 MCG (2000 UT) TABS, Take 2,000 Units by mouth daily., Disp: , Rfl:    CINNAMON PO, Take 2,000 mg by mouth daily., Disp: , Rfl:    estradiol (CLIMARA - DOSED IN MG/24 HR) 0.05 mg/24hr patch, Place 0.05 mg onto the skin once a week., Disp: , Rfl:    estradiol (CLIMARA) 0.05 mg/24hr patch, Place 1 patch (0.05 mg total) onto the skin every 7 (seven) days., Disp: 12 patch, Rfl: 2   ferrous sulfate 325 (65 FE) MG tablet, Take 325 mg by mouth daily., Disp: , Rfl:    fluticasone (FLONASE) 50 MCG/ACT nasal spray, Place 1 spray into both nostrils daily as needed  for allergies. , Disp: , Rfl:    Insulin Pen Needle (PEN NEEDLES) 32G X 4 MM MISC, Use as directed with saxenda., Disp: 100 each, Rfl: 0   Liraglutide -Weight Management (SAXENDA) 18 MG/3ML SOPN, Inject 3 mg into the skin daily., Disp: 15 mL, Rfl: 0   Soft Lens Products (REWETTING DROPS) SOLN, Place 1 drop into both eyes daily as needed (dry/irritated eyes.)., Disp: , Rfl:    Testosterone 1.62 % GEL, Apply 0.25 mLs topically See admin instructions. Apply 0.59m topically twice weekly., Disp: , Rfl:    vitamin B-12 (CYANOCOBALAMIN) 1000 MCG tablet, Take 2,000 mcg by mouth daily., Disp: , Rfl:    Vitamin D, Ergocalciferol, (DRISDOL) 1.25 MG (50000 UNIT) CAPS capsule, Take 1 capsule (50,000 Units total) by mouth every 14 (fourteen) days., Disp: 8 capsule, Rfl: 0   estradiol (CLIMARA - DOSED IN MG/24 HR) 0.05 mg/24hr patch, APPLY 1 PATCH TO SKIN ONCE A WEEK., Disp: 12 patch, Rfl: 3  Social History   Tobacco Use  Smoking Status Never  Smokeless Tobacco Never    No Known Allergies Objective:  There were no vitals filed for this visit. There is no height or weight on file to calculate BMI. Constitutional Well developed. Well nourished.  Vascular Dorsalis  pedis pulses palpable bilaterally. Posterior tibial pulses palpable bilaterally. Capillary refill normal to all digits.  No cyanosis or clubbing noted. Pedal hair growth normal.  Neurologic Normal speech. Oriented to person, place, and time. Epicritic sensation to light touch grossly present bilaterally.  Dermatologic Nails thickened elongated dystrophic mycotic toenails x2 bilateral hallux.  No pain on palpation Skin within normal limits  Orthopedic: Normal joint ROM without pain or crepitus bilaterally. No visible deformities. No bony tenderness.   Radiographs: None Assessment:   1. Long-term use of high-risk medication   2. Nail fungus   3. Onychomycosis due to dermatophyte    Plan:  Patient was evaluated and treated and all  questions answered.  Bilateral hallux onychomycosis -Educated the patient on the etiology of onychomycosis and various treatment options associated with improving the fungal load.  I explained to the patient that there is 3 treatment options available to treat the onychomycosis including topical, p.o., laser treatment.  Patient elected to undergo p.o. options with Lamisil/terbinafine therapy.  In order for me to start the medication therapy, I explained to the patient the importance of evaluating the liver and obtaining the liver function test.  Once the liver function test comes back normal I will start him on 20-monthcourse of Lamisil therapy.  Patient understood all risk and would like to proceed with Lamisil therapy.  I have asked the patient to immediately stop the Lamisil therapy if she has any reactions to it and call the office or go to the emergency room right away.  Patient states understanding -When she is ready to take Lamisil she will give me a call.   No follow-ups on file.

## 2022-06-28 ENCOUNTER — Other Ambulatory Visit (HOSPITAL_COMMUNITY): Payer: Self-pay

## 2022-07-03 ENCOUNTER — Other Ambulatory Visit (HOSPITAL_COMMUNITY): Payer: Self-pay

## 2022-07-05 ENCOUNTER — Ambulatory Visit (INDEPENDENT_AMBULATORY_CARE_PROVIDER_SITE_OTHER): Payer: 59 | Admitting: Family Medicine

## 2022-07-06 ENCOUNTER — Other Ambulatory Visit: Payer: Self-pay | Admitting: Obstetrics and Gynecology

## 2022-07-06 DIAGNOSIS — Z1231 Encounter for screening mammogram for malignant neoplasm of breast: Secondary | ICD-10-CM

## 2022-07-12 ENCOUNTER — Ambulatory Visit (INDEPENDENT_AMBULATORY_CARE_PROVIDER_SITE_OTHER): Payer: 59 | Admitting: Family Medicine

## 2022-07-12 ENCOUNTER — Encounter (INDEPENDENT_AMBULATORY_CARE_PROVIDER_SITE_OTHER): Payer: Self-pay

## 2022-07-21 ENCOUNTER — Other Ambulatory Visit (HOSPITAL_COMMUNITY): Payer: Self-pay

## 2022-07-21 MED ORDER — TERBINAFINE HCL 250 MG PO TABS
250.0000 mg | ORAL_TABLET | Freq: Every day | ORAL | 0 refills | Status: DC
  Filled 2022-07-21: qty 90, 90d supply, fill #0

## 2022-07-25 ENCOUNTER — Other Ambulatory Visit (INDEPENDENT_AMBULATORY_CARE_PROVIDER_SITE_OTHER): Payer: Self-pay | Admitting: Family Medicine

## 2022-07-25 DIAGNOSIS — E559 Vitamin D deficiency, unspecified: Secondary | ICD-10-CM

## 2022-07-26 ENCOUNTER — Other Ambulatory Visit (HOSPITAL_COMMUNITY): Payer: Self-pay

## 2022-07-27 ENCOUNTER — Other Ambulatory Visit (HOSPITAL_COMMUNITY): Payer: Self-pay

## 2022-07-27 ENCOUNTER — Ambulatory Visit
Admission: RE | Admit: 2022-07-27 | Discharge: 2022-07-27 | Disposition: A | Discharge status: Home / Self Care | Payer: 59 | Source: Ambulatory Visit | Attending: Obstetrics and Gynecology | Admitting: Obstetrics and Gynecology

## 2022-07-27 ENCOUNTER — Other Ambulatory Visit (INDEPENDENT_AMBULATORY_CARE_PROVIDER_SITE_OTHER): Payer: Self-pay | Admitting: Family Medicine

## 2022-07-27 DIAGNOSIS — E559 Vitamin D deficiency, unspecified: Secondary | ICD-10-CM

## 2022-07-27 DIAGNOSIS — Z1231 Encounter for screening mammogram for malignant neoplasm of breast: Secondary | ICD-10-CM

## 2022-07-28 ENCOUNTER — Other Ambulatory Visit (HOSPITAL_COMMUNITY): Payer: Self-pay

## 2022-07-31 ENCOUNTER — Other Ambulatory Visit: Payer: Self-pay | Admitting: Obstetrics and Gynecology

## 2022-07-31 DIAGNOSIS — R928 Other abnormal and inconclusive findings on diagnostic imaging of breast: Secondary | ICD-10-CM

## 2022-08-01 ENCOUNTER — Other Ambulatory Visit (INDEPENDENT_AMBULATORY_CARE_PROVIDER_SITE_OTHER): Payer: Self-pay | Admitting: Family Medicine

## 2022-08-01 ENCOUNTER — Other Ambulatory Visit (HOSPITAL_COMMUNITY): Payer: Self-pay

## 2022-08-01 DIAGNOSIS — E559 Vitamin D deficiency, unspecified: Secondary | ICD-10-CM

## 2022-08-03 ENCOUNTER — Ambulatory Visit
Admission: RE | Admit: 2022-08-03 | Discharge: 2022-08-03 | Disposition: A | Discharge status: Home / Self Care | Payer: 59 | Source: Ambulatory Visit | Attending: Obstetrics and Gynecology | Admitting: Obstetrics and Gynecology

## 2022-08-03 DIAGNOSIS — R928 Other abnormal and inconclusive findings on diagnostic imaging of breast: Secondary | ICD-10-CM

## 2022-08-03 DIAGNOSIS — N6001 Solitary cyst of right breast: Secondary | ICD-10-CM | POA: Diagnosis not present

## 2022-08-10 ENCOUNTER — Other Ambulatory Visit (HOSPITAL_COMMUNITY): Payer: Self-pay

## 2022-08-10 ENCOUNTER — Ambulatory Visit (INDEPENDENT_AMBULATORY_CARE_PROVIDER_SITE_OTHER): Payer: 59 | Admitting: Family Medicine

## 2022-08-10 ENCOUNTER — Encounter (INDEPENDENT_AMBULATORY_CARE_PROVIDER_SITE_OTHER): Payer: Self-pay | Admitting: Family Medicine

## 2022-08-10 ENCOUNTER — Other Ambulatory Visit: Payer: 59

## 2022-08-10 VITALS — BP 99/64 | HR 55 | Temp 98.0°F | Ht 62.0 in | Wt 167.0 lb

## 2022-08-10 DIAGNOSIS — E559 Vitamin D deficiency, unspecified: Secondary | ICD-10-CM

## 2022-08-10 DIAGNOSIS — E669 Obesity, unspecified: Secondary | ICD-10-CM

## 2022-08-10 DIAGNOSIS — F3289 Other specified depressive episodes: Secondary | ICD-10-CM | POA: Diagnosis not present

## 2022-08-10 DIAGNOSIS — Z6830 Body mass index (BMI) 30.0-30.9, adult: Secondary | ICD-10-CM

## 2022-08-10 DIAGNOSIS — F32A Depression, unspecified: Secondary | ICD-10-CM | POA: Insufficient documentation

## 2022-08-10 DIAGNOSIS — E88819 Insulin resistance, unspecified: Secondary | ICD-10-CM | POA: Insufficient documentation

## 2022-08-10 DIAGNOSIS — Z01419 Encounter for gynecological examination (general) (routine) without abnormal findings: Secondary | ICD-10-CM | POA: Diagnosis not present

## 2022-08-10 DIAGNOSIS — E8881 Metabolic syndrome: Secondary | ICD-10-CM

## 2022-08-10 DIAGNOSIS — N951 Menopausal and female climacteric states: Secondary | ICD-10-CM | POA: Diagnosis not present

## 2022-08-10 DIAGNOSIS — R6882 Decreased libido: Secondary | ICD-10-CM | POA: Diagnosis not present

## 2022-08-10 DIAGNOSIS — E7849 Other hyperlipidemia: Secondary | ICD-10-CM | POA: Diagnosis not present

## 2022-08-10 MED ORDER — SEMAGLUTIDE-WEIGHT MANAGEMENT 0.25 MG/0.5ML ~~LOC~~ SOAJ
0.2500 mg | SUBCUTANEOUS | 0 refills | Status: DC
  Filled 2022-08-10 – 2022-08-28 (×3): qty 2, 28d supply, fill #0

## 2022-08-10 MED ORDER — VITAMIN D (ERGOCALCIFEROL) 1.25 MG (50000 UNIT) PO CAPS
50000.0000 [IU] | ORAL_CAPSULE | ORAL | 0 refills | Status: DC
  Filled 2022-08-10: qty 6, 84d supply, fill #0

## 2022-08-14 ENCOUNTER — Telehealth (INDEPENDENT_AMBULATORY_CARE_PROVIDER_SITE_OTHER): Payer: Self-pay | Admitting: Internal Medicine

## 2022-08-14 ENCOUNTER — Encounter (INDEPENDENT_AMBULATORY_CARE_PROVIDER_SITE_OTHER): Payer: Self-pay

## 2022-08-14 NOTE — Telephone Encounter (Signed)
Dr. Gerarda Fraction - Prior authorization approved for [facility]. Effective:08/11/2022 to 03/02/2023. Patient sent approval message via mychart.

## 2022-08-16 ENCOUNTER — Other Ambulatory Visit (HOSPITAL_BASED_OUTPATIENT_CLINIC_OR_DEPARTMENT_OTHER): Payer: Self-pay

## 2022-08-16 ENCOUNTER — Other Ambulatory Visit (HOSPITAL_COMMUNITY): Payer: Self-pay

## 2022-08-16 NOTE — Progress Notes (Unsigned)
Chief Complaint:   OBESITY Laura [name] is here to discuss her progress with her obesity treatment plan along with follow-up of her obesity related diagnoses. Laura [name] is on the Category 2 Plan and states she is following her eating plan approximately 80% of the time. Laura [name] states she is lifting weights and playing pickle ball for 45-120 minutes 3 times per week.  Today's visit was #: 20 Starting weight: 168 lbs Starting date: 01/20/2021 Today's weight: 167 lbs Today's date: 08/10/2022 Total lbs lost to date: 1 Total lbs lost since last in-office visit: 0  Interim History: Laura [name] is present for her follow-up visit.  She notes good adherence to reduce calorie plan and physical activity.  She stopped Saxenda due to low energy while on this medication.  She has occasional cravings which she manages with drinking coffee.  Subjective:   1. Other hyperlipidemia Laura [name]'s last LDL was 149, and a CVD risk score 2%.  No indication for statin at present.  2. Insulin resistance Laura [name]'s insulin level has improved from 11 down to 5.3.  3. Vitamin D deficiency Laura [name]'s Vitamin D level has decreased to 30.6, she is asymptomatic.  4. Other depression with emotional eating Laura [name] has good coping strategies and control with bupropion.  She denies side effects.  Assessment/Plan:   1. Other hyperlipidemia Laura [name] will continue her medical nutrition therapy and we will recheck FLP at her future office visit.  2. Insulin resistance Laura [name] will continue her medical nutrition therapy.  She will start Wegovy 0.25 mg once weekly.  3. Vitamin D deficiency Laura [name] will continue prescription vitamin D every 14 days, and we will refill for 4 months.  - Vitamin D, Ergocalciferol, (DRISDOL) 1.25 MG (50000 UNIT) CAPS capsule; Take 1 capsule (50,000 Units total) by mouth every 14 (fourteen) days.  Dispense: 8 capsule; Refill: 0  4. Other depression with emotional eating Laura [name] will continue her  medications as directed.   5. Obesity, Current BMI 30.6 Laura [name] is currently in the action stage of change. As such, her goal is to continue with weight loss efforts. She has agreed to the Category 2 Plan.   We discussed various medication options to help Laura [name] with her weight loss efforts and we both agreed to discontinue Saxenda, and start Wegovy 0.25 mg once weekly with no refills.  Reviewed medication side effects with the patient.  - Semaglutide-Weight Management 0.25 MG/0.5ML SOAJ; Inject 0.25 mg into the skin once a week.  Dispense: 2 mL; Refill: 0  Exercise goals: As is.  Behavioral modification strategies: emotional eating strategies.  Laura [name] has agreed to follow-up with our clinic in 3 to 4 weeks. She was informed of the importance of frequent follow-up visits to maximize her success with intensive lifestyle modifications for her multiple health conditions.   Objective:   Blood pressure 99/64, pulse (!) 55, temperature 98 F (36.7 C), height '5\' 2"'$  (1.575 m), weight 167 lb (75.8 kg), SpO2 98 %. Body mass index is 30.54 kg/m.  General: Cooperative, alert, well developed, in no acute distress. HEENT: Conjunctivae and lids unremarkable. Cardiovascular: Regular rhythm.  Lungs: Normal work of breathing. Neurologic: No focal deficits.   Lab Results  Component Value Date   CREATININE 0.80 03/14/2022   BUN 14 03/14/2022   NA 142 03/14/2022   K 4.3 03/14/2022   CL 104 03/14/2022   CO2 23 03/14/2022   Lab Results  Component Value Date   ALT 21 03/14/2022   AST 12 03/14/2022   ALKPHOS 53 03/14/2022  BILITOT 0.3 03/14/2022   Lab Results  Component Value Date   HGBA1C 5.4 03/14/2022   HGBA1C 5.3 09/27/2021   HGBA1C 5.4 05/09/2021   HGBA1C 5.2 01/20/2021   Lab Results  Component Value Date   INSULIN 5.3 03/14/2022   INSULIN 6.0 09/27/2021   INSULIN 5.8 05/09/2021   INSULIN 11.9 01/20/2021   Lab Results  Component Value Date   TSH 1.480 01/20/2021   Lab  Results  Component Value Date   CHOL 219 (H) 03/14/2022   HDL 60 03/14/2022   LDLCALC 149 (H) 03/14/2022   TRIG 57 03/14/2022   CHOLHDL 3.3 11/07/2017   Lab Results  Component Value Date   VD25OH 30.6 03/14/2022   VD25OH 54.8 09/27/2021   VD25OH 75.5 05/09/2021   Lab Results  Component Value Date   WBC 5.4 01/20/2021   HGB 13.7 01/20/2021   HCT 42.2 01/20/2021   MCV 92 01/20/2021   PLT 269 01/20/2021   No results found for: "IRON", "TIBC", "FERRITIN"  Attestation Statements:   Reviewed by clinician on day of visit: allergies, medications, problem list, medical history, surgical history, family history, social history, and previous encounter notes.   I, Trixie Dredge, am acting as transcriptionist for Dennard Nip, MD.  I have reviewed the above documentation for accuracy and completeness, and I agree with the above. -  Dennard Nip, MD

## 2022-08-24 DIAGNOSIS — J309 Allergic rhinitis, unspecified: Secondary | ICD-10-CM | POA: Diagnosis not present

## 2022-08-24 DIAGNOSIS — Z6830 Body mass index (BMI) 30.0-30.9, adult: Secondary | ICD-10-CM | POA: Diagnosis not present

## 2022-08-24 DIAGNOSIS — E669 Obesity, unspecified: Secondary | ICD-10-CM | POA: Diagnosis not present

## 2022-08-24 DIAGNOSIS — Z0000 Encounter for general adult medical examination without abnormal findings: Secondary | ICD-10-CM | POA: Diagnosis not present

## 2022-08-24 DIAGNOSIS — R059 Cough, unspecified: Secondary | ICD-10-CM | POA: Diagnosis not present

## 2022-08-24 DIAGNOSIS — E7800 Pure hypercholesterolemia, unspecified: Secondary | ICD-10-CM | POA: Diagnosis not present

## 2022-08-28 ENCOUNTER — Other Ambulatory Visit (HOSPITAL_BASED_OUTPATIENT_CLINIC_OR_DEPARTMENT_OTHER): Payer: Self-pay

## 2022-08-28 ENCOUNTER — Other Ambulatory Visit (HOSPITAL_COMMUNITY): Payer: Self-pay

## 2022-09-07 ENCOUNTER — Ambulatory Visit (INDEPENDENT_AMBULATORY_CARE_PROVIDER_SITE_OTHER): Payer: 59 | Admitting: Family Medicine

## 2022-09-18 ENCOUNTER — Other Ambulatory Visit (HOSPITAL_COMMUNITY): Payer: Self-pay

## 2022-09-18 ENCOUNTER — Ambulatory Visit (INDEPENDENT_AMBULATORY_CARE_PROVIDER_SITE_OTHER): Payer: 59 | Admitting: Adult Health

## 2022-09-18 ENCOUNTER — Encounter (INDEPENDENT_AMBULATORY_CARE_PROVIDER_SITE_OTHER): Payer: Self-pay | Admitting: Adult Health

## 2022-09-18 VITALS — BP 116/80 | HR 81 | Temp 98.3°F | Ht 62.0 in | Wt 166.0 lb

## 2022-09-18 DIAGNOSIS — Z6830 Body mass index (BMI) 30.0-30.9, adult: Secondary | ICD-10-CM | POA: Diagnosis not present

## 2022-09-18 DIAGNOSIS — E88819 Insulin resistance, unspecified: Secondary | ICD-10-CM | POA: Diagnosis not present

## 2022-09-18 DIAGNOSIS — H5213 Myopia, bilateral: Secondary | ICD-10-CM | POA: Diagnosis not present

## 2022-09-18 DIAGNOSIS — E669 Obesity, unspecified: Secondary | ICD-10-CM

## 2022-09-18 DIAGNOSIS — H524 Presbyopia: Secondary | ICD-10-CM | POA: Diagnosis not present

## 2022-09-18 MED ORDER — WEGOVY 0.5 MG/0.5ML ~~LOC~~ SOAJ
0.5000 mg | SUBCUTANEOUS | 0 refills | Status: DC
  Filled 2022-09-18 – 2022-09-25 (×2): qty 2, 28d supply, fill #0

## 2022-09-20 ENCOUNTER — Other Ambulatory Visit (HOSPITAL_COMMUNITY): Payer: Self-pay

## 2022-09-20 MED ORDER — INFLUENZA VAC SPLIT QUAD 0.5 ML IM SUSY
0.5000 mL | PREFILLED_SYRINGE | INTRAMUSCULAR | 0 refills | Status: DC
  Filled 2022-09-20: qty 0.5, 1d supply, fill #0

## 2022-09-25 ENCOUNTER — Other Ambulatory Visit (HOSPITAL_BASED_OUTPATIENT_CLINIC_OR_DEPARTMENT_OTHER): Payer: Self-pay

## 2022-09-25 ENCOUNTER — Other Ambulatory Visit (HOSPITAL_COMMUNITY): Payer: Self-pay

## 2022-09-25 MED ORDER — ESTRADIOL 0.05 MG/24HR TD PTWK
0.0500 mg | MEDICATED_PATCH | TRANSDERMAL | 2 refills | Status: DC
  Filled 2022-09-25: qty 12, 84d supply, fill #0
  Filled 2022-12-15: qty 12, 84d supply, fill #1
  Filled 2023-03-11 – 2023-03-19 (×2): qty 12, 84d supply, fill #2

## 2022-09-25 NOTE — Progress Notes (Signed)
Chief Complaint:   OBESITY Laura [name] is here to discuss her progress with her obesity treatment plan along with follow-up of her obesity related diagnoses. Laura [name] is on the Category 2 Plan and states she is following her eating plan approximately 95% of the time. Laura [name] states she is lifting weights and playing pickleball 60+ minutes 2-3 times per week.  Today's visit was #: 21 Starting weight: 168 lbs Starting date: 01/20/2022 Today's weight: 166 lbs Today's date: 09/18/2022 Total lbs lost to date: 2 lbs Total lbs lost since last in-office visit: 1 lb  Interim History:  08/10/2022-Saxenda replaced with Wegovy due to fatigue with Saxenda therapy. Her max dose of Saxenda was 2.'4mg'$  daily injectin. She has had 3 doses of Wegovy 0.25 mg then increased Wegovy to 0.5 mg.   Subjective:   1. Insulin resistance Insulin level has ranged from 5.3 to 11.9.   Saxenda 2.4 mg previously ceased due to fatigue, replaced with BMWUXL.  She has had 3 doses of Wegovy 0.25 mg then increased Wegovy to 0.5 mg.  She endorses low appetite on mx dose of Wegovy 0.'5mg'$ . She denies mass in neck, dysphagia, dyspepsia, persistent hoarseness, abdominal pain, or N/V/Constipation.  Assessment/Plan:   1. Insulin resistance Increase daily protein intake.  Continue regular exercise. Continue weekly GLP-1 injectable therapy.  2. Obesity, Current BMI 30.4 Refill - Semaglutide-Weight Management (WEGOVY) 0.5 MG/0.5ML SOAJ; Inject 0.5 mg into the skin once a week.  Dispense: 2 mL; Refill: 0  Laura [name] is currently in the action stage of change. As such, her goal is to continue with weight loss efforts. She has agreed to the Category 2 Plan.   Exercise goals:  As is.  Behavioral modification strategies: increasing lean protein intake, decreasing simple carbohydrates, meal planning and cooking strategies, keeping healthy foods in the home, and planning for success.  Ketra has agreed to follow-up with our clinic  in 4 weeks. She was informed of the importance of frequent follow-up visits to maximize her success with intensive lifestyle modifications for her multiple health conditions.   Objective:   Blood pressure 116/80, pulse 81, temperature 98.3 F (36.8 C), height '5\' 2"'$  (1.575 m), weight 166 lb (75.3 kg), SpO2 97 %. Body mass index is 30.36 kg/m.  General: Cooperative, alert, well developed, in no acute distress. HEENT: Conjunctivae and lids unremarkable. Cardiovascular: Regular rhythm.  Lungs: Normal work of breathing. Neurologic: No focal deficits.   Lab Results  Component Value Date   CREATININE 0.80 03/14/2022   BUN 14 03/14/2022   NA 142 03/14/2022   K 4.3 03/14/2022   CL 104 03/14/2022   CO2 23 03/14/2022   Lab Results  Component Value Date   ALT 21 03/14/2022   AST 12 03/14/2022   ALKPHOS 53 03/14/2022   BILITOT 0.3 03/14/2022   Lab Results  Component Value Date   HGBA1C 5.4 03/14/2022   HGBA1C 5.3 09/27/2021   HGBA1C 5.4 05/09/2021   HGBA1C 5.2 01/20/2021   Lab Results  Component Value Date   INSULIN 5.3 03/14/2022   INSULIN 6.0 09/27/2021   INSULIN 5.8 05/09/2021   INSULIN 11.9 01/20/2021   Lab Results  Component Value Date   TSH 1.480 01/20/2021   Lab Results  Component Value Date   CHOL 219 (H) 03/14/2022   HDL 60 03/14/2022   LDLCALC 149 (H) 03/14/2022   TRIG 57 03/14/2022   CHOLHDL 3.3 11/07/2017   Lab Results  Component Value Date   VD25OH 30.6 03/14/2022  VD25OH 54.8 09/27/2021   VD25OH 75.5 05/09/2021   Lab Results  Component Value Date   WBC 5.4 01/20/2021   HGB 13.7 01/20/2021   HCT 42.2 01/20/2021   MCV 92 01/20/2021   PLT 269 01/20/2021   No results found for: "IRON", "TIBC", "FERRITIN"  Attestation Statements:   Reviewed by clinician on day of visit: allergies, medications, problem list, medical history, surgical history, family history, social history, and previous encounter notes.  I, Laura [name], RMA, am acting as  Location manager for Laura Marble, NP.  I have reviewed the above documentation for accuracy and completeness, and I agree with the above. -  Laura [name] d. Laura Mishra, NP-C

## 2022-10-12 DIAGNOSIS — Z01818 Encounter for other preprocedural examination: Secondary | ICD-10-CM | POA: Diagnosis not present

## 2022-10-12 DIAGNOSIS — Z1211 Encounter for screening for malignant neoplasm of colon: Secondary | ICD-10-CM | POA: Diagnosis not present

## 2022-10-16 ENCOUNTER — Encounter (INDEPENDENT_AMBULATORY_CARE_PROVIDER_SITE_OTHER): Payer: Self-pay | Admitting: Family Medicine

## 2022-10-16 ENCOUNTER — Ambulatory Visit (INDEPENDENT_AMBULATORY_CARE_PROVIDER_SITE_OTHER): Payer: 59 | Admitting: Family Medicine

## 2022-10-16 ENCOUNTER — Other Ambulatory Visit (HOSPITAL_COMMUNITY): Payer: Self-pay

## 2022-10-16 VITALS — BP 103/68 | HR 87 | Temp 98.3°F | Ht 62.0 in | Wt 165.0 lb

## 2022-10-16 DIAGNOSIS — E559 Vitamin D deficiency, unspecified: Secondary | ICD-10-CM

## 2022-10-16 DIAGNOSIS — E88819 Insulin resistance, unspecified: Secondary | ICD-10-CM

## 2022-10-16 DIAGNOSIS — E669 Obesity, unspecified: Secondary | ICD-10-CM | POA: Diagnosis not present

## 2022-10-16 DIAGNOSIS — Z6830 Body mass index (BMI) 30.0-30.9, adult: Secondary | ICD-10-CM

## 2022-10-16 MED ORDER — VITAMIN D (ERGOCALCIFEROL) 1.25 MG (50000 UNIT) PO CAPS
50000.0000 [IU] | ORAL_CAPSULE | ORAL | 0 refills | Status: DC
  Filled 2022-10-16 – 2022-11-13 (×3): qty 6, 84d supply, fill #0

## 2022-10-16 MED ORDER — WEGOVY 0.5 MG/0.5ML ~~LOC~~ SOAJ
0.5000 mg | SUBCUTANEOUS | 0 refills | Status: DC
  Filled 2022-10-16 – 2022-11-10 (×3): qty 2, 28d supply, fill #0

## 2022-10-17 DIAGNOSIS — Z1211 Encounter for screening for malignant neoplasm of colon: Secondary | ICD-10-CM | POA: Diagnosis not present

## 2022-10-19 ENCOUNTER — Other Ambulatory Visit (HOSPITAL_BASED_OUTPATIENT_CLINIC_OR_DEPARTMENT_OTHER): Payer: Self-pay

## 2022-10-23 ENCOUNTER — Other Ambulatory Visit (HOSPITAL_BASED_OUTPATIENT_CLINIC_OR_DEPARTMENT_OTHER): Payer: Self-pay

## 2022-10-24 ENCOUNTER — Other Ambulatory Visit (HOSPITAL_COMMUNITY): Payer: Self-pay

## 2022-10-24 ENCOUNTER — Other Ambulatory Visit (HOSPITAL_BASED_OUTPATIENT_CLINIC_OR_DEPARTMENT_OTHER): Payer: Self-pay

## 2022-10-30 ENCOUNTER — Other Ambulatory Visit (HOSPITAL_BASED_OUTPATIENT_CLINIC_OR_DEPARTMENT_OTHER): Payer: Self-pay

## 2022-10-30 ENCOUNTER — Other Ambulatory Visit (HOSPITAL_COMMUNITY): Payer: Self-pay

## 2022-10-30 ENCOUNTER — Encounter (INDEPENDENT_AMBULATORY_CARE_PROVIDER_SITE_OTHER): Payer: Self-pay | Admitting: Family Medicine

## 2022-10-30 ENCOUNTER — Other Ambulatory Visit (INDEPENDENT_AMBULATORY_CARE_PROVIDER_SITE_OTHER): Payer: Self-pay | Admitting: Family Medicine

## 2022-10-30 DIAGNOSIS — E669 Obesity, unspecified: Secondary | ICD-10-CM

## 2022-10-30 NOTE — Progress Notes (Signed)
Chief Complaint:   OBESITY Niah is here to discuss her progress with her obesity treatment plan along with follow-up of her obesity related diagnoses. Swayzie is on the Category 2 Plan and states she is following her eating plan approximately 85% of the time. Nayomi states she is doing 0 minutes 0 times per week.  Today's visit was #: 22 Starting weight: 168 lbs Starting date: 01/20/2022 Today's weight: 165 lbs Today's date: 10/16/2022 Total lbs lost to date: 3 Total lbs lost since last in-office visit: 1  Interim History: Deijah continues to work on her diet, and she has no problems with ACZYSA. Her hunger is controlled and she is working on meal planning.   Subjective:   1. Insulin resistance Kenneisha is continuing to decrease simple carbohydrates and work on her weight loss. She is also on a GLP-1 which will help as well.   2. Vitamin D deficiency Alwilda is on Vitamin D, and she denies nausea, vomiting, or muscle weakness.   Assessment/Plan:   1. Insulin resistance Medina will continue with her diet and exercise, and we will recheck labs in 1-2 months.   2. Vitamin D deficiency We will refill prescription Vitamin D for 90 days. We will recheck labs in 1-2 months.   - Vitamin D, Ergocalciferol, (DRISDOL) 1.25 MG (50000 UNIT) CAPS capsule; Take 1 capsule (50,000 Units total) by mouth every 14 (fourteen) days.  Dispense: 8 capsule; Refill: 0  3. Obesity, Current BMI 30.2 Lemya is currently in the action stage of change. As such, her goal is to continue with weight loss efforts. She has agreed to the Category 2 Plan.   Jessamyn will continue Wegovy 0.5 mg once weekly, and we will refill for 1 month.   - Semaglutide-Weight Management (WEGOVY) 0.5 MG/0.5ML SOAJ; Inject 0.5 mg into the skin once a week.  Dispense: 2 mL; Refill: 0  Behavioral modification strategies: increasing lean protein intake.  Diana has agreed to follow-up with our clinic in 4 weeks. She was  informed of the importance of frequent follow-up visits to maximize her success with intensive lifestyle modifications for her multiple health conditions.   Objective:   Blood pressure 103/68, pulse 87, temperature 98.3 F (36.8 C), height '5\' 2"'$  (1.575 m), weight 165 lb (74.8 kg), SpO2 98 %. Body mass index is 30.18 kg/m.  General: Cooperative, alert, well developed, in no acute distress. HEENT: Conjunctivae and lids unremarkable. Cardiovascular: Regular rhythm.  Lungs: Normal work of breathing. Neurologic: No focal deficits.   Lab Results  Component Value Date   CREATININE 0.80 03/14/2022   BUN 14 03/14/2022   NA 142 03/14/2022   K 4.3 03/14/2022   CL 104 03/14/2022   CO2 23 03/14/2022   Lab Results  Component Value Date   ALT 21 03/14/2022   AST 12 03/14/2022   ALKPHOS 53 03/14/2022   BILITOT 0.3 03/14/2022   Lab Results  Component Value Date   HGBA1C 5.4 03/14/2022   HGBA1C 5.3 09/27/2021   HGBA1C 5.4 05/09/2021   HGBA1C 5.2 01/20/2021   Lab Results  Component Value Date   INSULIN 5.3 03/14/2022   INSULIN 6.0 09/27/2021   INSULIN 5.8 05/09/2021   INSULIN 11.9 01/20/2021   Lab Results  Component Value Date   TSH 1.480 01/20/2021   Lab Results  Component Value Date   CHOL 219 (H) 03/14/2022   HDL 60 03/14/2022   LDLCALC 149 (H) 03/14/2022   TRIG 57 03/14/2022   CHOLHDL 3.3 11/07/2017  Lab Results  Component Value Date   VD25OH 30.6 03/14/2022   VD25OH 54.8 09/27/2021   VD25OH 75.5 05/09/2021   Lab Results  Component Value Date   WBC 5.4 01/20/2021   HGB 13.7 01/20/2021   HCT 42.2 01/20/2021   MCV 92 01/20/2021   PLT 269 01/20/2021   No results found for: "IRON", "TIBC", "FERRITIN"  Attestation Statements:   Reviewed by clinician on day of visit: allergies, medications, problem list, medical history, surgical history, family history, social history, and previous encounter notes.  I have personally spent 42 minutes total time today in  preparation, patient care, and documentation for this visit, including the following: review of clinical lab tests; review of medical tests/procedures/services.   I, Trixie Dredge, am acting as transcriptionist for Dennard Nip, MD.  I have reviewed the above documentation for accuracy and completeness, and I agree with the above. -  Dennard Nip, MD

## 2022-11-02 ENCOUNTER — Other Ambulatory Visit (HOSPITAL_COMMUNITY): Payer: Self-pay

## 2022-11-03 ENCOUNTER — Other Ambulatory Visit (HOSPITAL_COMMUNITY): Payer: Self-pay

## 2022-11-06 ENCOUNTER — Other Ambulatory Visit (HOSPITAL_BASED_OUTPATIENT_CLINIC_OR_DEPARTMENT_OTHER): Payer: Self-pay

## 2022-11-10 ENCOUNTER — Other Ambulatory Visit (HOSPITAL_COMMUNITY): Payer: Self-pay

## 2022-11-10 ENCOUNTER — Other Ambulatory Visit (HOSPITAL_BASED_OUTPATIENT_CLINIC_OR_DEPARTMENT_OTHER): Payer: Self-pay

## 2022-11-13 ENCOUNTER — Ambulatory Visit (INDEPENDENT_AMBULATORY_CARE_PROVIDER_SITE_OTHER): Payer: 59 | Admitting: Physician Assistant

## 2022-11-13 ENCOUNTER — Encounter (INDEPENDENT_AMBULATORY_CARE_PROVIDER_SITE_OTHER): Payer: Self-pay | Admitting: Physician Assistant

## 2022-11-13 ENCOUNTER — Other Ambulatory Visit (HOSPITAL_COMMUNITY): Payer: Self-pay

## 2022-11-13 VITALS — BP 102/69 | HR 79 | Temp 98.5°F | Ht 62.0 in | Wt 169.0 lb

## 2022-11-13 DIAGNOSIS — E88819 Insulin resistance, unspecified: Secondary | ICD-10-CM

## 2022-11-13 DIAGNOSIS — E559 Vitamin D deficiency, unspecified: Secondary | ICD-10-CM | POA: Diagnosis not present

## 2022-11-13 DIAGNOSIS — Z6831 Body mass index (BMI) 31.0-31.9, adult: Secondary | ICD-10-CM | POA: Diagnosis not present

## 2022-11-13 DIAGNOSIS — E669 Obesity, unspecified: Secondary | ICD-10-CM

## 2022-11-13 MED ORDER — WEGOVY 0.5 MG/0.5ML ~~LOC~~ SOAJ
0.5000 mg | SUBCUTANEOUS | 0 refills | Status: DC
  Filled 2022-11-13: qty 2, 28d supply, fill #0

## 2022-11-29 NOTE — Progress Notes (Signed)
Chief Complaint:   OBESITY Laura [name] is here to discuss her progress with her obesity treatment plan along with follow-up of her obesity related diagnoses. Laura [name] is on the Category 2 Plan and states she is following her eating plan approximately 90% of the time. Laura [name] states she is exercising 0 minutes 0 times per week.  Today's visit was #: 23 Starting weight: 168 lbs Starting date: 01/20/2022 Today's weight: 169 lbs Today's date: 11/13/2022 Total lbs lost to date: 0 lbs Total lbs lost since last in-office visit: 0  Interim History: Laura [name] struggling with weight loss--unable to get Wegovy due to [employer]. Reports increase in appetite. Discussed strategies to decrease hunger--such as meeting protein goals consistently.  She has tried [name] in the past and wants to try to continue to obtain [facility] for now.  She did request to try to start at a higher dose of Wegovy and we discussed that this would increase the risk of gastrointestinal upset and possible more serious complications like gastroparesis and it would be best to start at the lower doses.  Subjective:   1. Vitamin D deficiency Laura [name] is currently taking prescription Vit D 50,000 IU once a week. Level of 30.6 on 03/14/22.  2. Insulin resistance Laura [name] was unable to obtain Wegovy 0.5 mg. Discussed changing back to [place], but she prefers to wait and see if [employer] [employer] comes back in stock.  Assessment/Plan:   1. Vitamin D deficiency Continue Vit D weekly. Follow up on labs at next visit.  2. Insulin resistance Continue Wegovy once available. Continue healthy eating plan to decrease simple carbohydrates, decrease saturated fats, increase lean protein and exercise to promote weight loss.  3. Obesity, Current BMI 31.0 We will refill Wegovy 0.5 mg SQ once weekly for 1 month with 0 refills.  -Refill Semaglutide-Weight Management (WEGOVY) 0.5 MG/0.5ML SOAJ; Inject 0.5 mg into the skin once a week.  Dispense: 2 mL;  Refill: 0  Laura [name] is currently in the action stage of change. As such, her goal is to continue with weight loss efforts. She has agreed to the Category 2 Plan.   Exercise goals: All adults should avoid inactivity. Some physical activity is better than none, and adults who participate in any amount of physical activity gain some health benefits.  Behavioral modification strategies: increasing lean protein intake, decreasing simple carbohydrates, and holiday eating strategies .  Laura [name] has agreed to follow-up with our clinic in 5 weeks. She was informed of the importance of frequent follow-up visits to maximize her success with intensive lifestyle modifications for her multiple health conditions.   Objective:   Blood pressure 102/69, pulse 79, temperature 98.5 F (36.9 C), height '5\' 2"'$  (1.575 m), weight 169 lb (76.7 kg), SpO2 99 %. Body mass index is 30.91 kg/m.  General: Cooperative, alert, well developed, in no acute distress. HEENT: Conjunctivae and lids unremarkable. Cardiovascular: Regular rhythm.  Lungs: Normal work of breathing. Neurologic: No focal deficits.   Lab Results  Component Value Date   CREATININE 0.80 03/14/2022   BUN 14 03/14/2022   NA 142 03/14/2022   K 4.3 03/14/2022   CL 104 03/14/2022   CO2 23 03/14/2022   Lab Results  Component Value Date   ALT 21 03/14/2022   AST 12 03/14/2022   ALKPHOS 53 03/14/2022   BILITOT 0.3 03/14/2022   Lab Results  Component Value Date   HGBA1C 5.4 03/14/2022   HGBA1C 5.3 09/27/2021   HGBA1C 5.4 05/09/2021   HGBA1C 5.2 01/20/2021  Lab Results  Component Value Date   INSULIN 5.3 03/14/2022   INSULIN 6.0 09/27/2021   INSULIN 5.8 05/09/2021   INSULIN 11.9 01/20/2021   Lab Results  Component Value Date   TSH 1.480 01/20/2021   Lab Results  Component Value Date   CHOL 219 (H) 03/14/2022   HDL 60 03/14/2022   LDLCALC 149 (H) 03/14/2022   TRIG 57 03/14/2022   CHOLHDL 3.3 11/07/2017   Lab Results  Component  Value Date   VD25OH 30.6 03/14/2022   VD25OH 54.8 09/27/2021   VD25OH 75.5 05/09/2021   Lab Results  Component Value Date   WBC 5.4 01/20/2021   HGB 13.7 01/20/2021   HCT 42.2 01/20/2021   MCV 92 01/20/2021   PLT 269 01/20/2021   No results found for: "IRON", "TIBC", "FERRITIN"  Attestation Statements:   Reviewed by clinician on day of visit: allergies, medications, problem list, medical history, surgical history, family history, social history, and previous encounter notes.  I, Brendell Tyus, am acting as transcriptionist for [employer], PA.  I have reviewed the above documentation for accuracy and completeness, and I agree with the above. -  Nicloe Frontera,PA-C

## 2022-12-05 ENCOUNTER — Other Ambulatory Visit (HOSPITAL_COMMUNITY): Payer: Self-pay

## 2022-12-10 ENCOUNTER — Encounter (INDEPENDENT_AMBULATORY_CARE_PROVIDER_SITE_OTHER): Payer: Self-pay | Admitting: Family Medicine

## 2022-12-11 ENCOUNTER — Other Ambulatory Visit (HOSPITAL_COMMUNITY): Payer: Self-pay

## 2022-12-18 ENCOUNTER — Other Ambulatory Visit (HOSPITAL_COMMUNITY): Payer: Self-pay

## 2022-12-19 ENCOUNTER — Other Ambulatory Visit (HOSPITAL_COMMUNITY): Payer: Self-pay

## 2022-12-19 ENCOUNTER — Encounter (INDEPENDENT_AMBULATORY_CARE_PROVIDER_SITE_OTHER): Payer: Self-pay | Admitting: Family Medicine

## 2022-12-19 ENCOUNTER — Ambulatory Visit (INDEPENDENT_AMBULATORY_CARE_PROVIDER_SITE_OTHER): Payer: 59 | Admitting: Family Medicine

## 2022-12-19 VITALS — BP 115/74 | HR 85 | Temp 98.0°F | Ht 62.0 in | Wt 172.0 lb

## 2022-12-19 DIAGNOSIS — E669 Obesity, unspecified: Secondary | ICD-10-CM | POA: Diagnosis not present

## 2022-12-19 DIAGNOSIS — E559 Vitamin D deficiency, unspecified: Secondary | ICD-10-CM | POA: Diagnosis not present

## 2022-12-19 DIAGNOSIS — E782 Mixed hyperlipidemia: Secondary | ICD-10-CM

## 2022-12-19 DIAGNOSIS — Z6831 Body mass index (BMI) 31.0-31.9, adult: Secondary | ICD-10-CM

## 2022-12-19 MED ORDER — WEGOVY 0.5 MG/0.5ML ~~LOC~~ SOAJ
0.5000 mg | SUBCUTANEOUS | 0 refills | Status: DC
  Filled 2022-12-19: qty 2, 28d supply, fill #0

## 2022-12-19 MED ORDER — ZEPBOUND 5 MG/0.5ML ~~LOC~~ SOAJ
5.0000 mg | SUBCUTANEOUS | 0 refills | Status: DC
  Filled 2022-12-19 – 2023-01-02 (×3): qty 2, 28d supply, fill #0

## 2022-12-19 MED ORDER — VITAMIN D (ERGOCALCIFEROL) 1.25 MG (50000 UNIT) PO CAPS
50000.0000 [IU] | ORAL_CAPSULE | ORAL | 0 refills | Status: DC
  Filled 2022-12-19: qty 8, 112d supply, fill #0

## 2022-12-20 ENCOUNTER — Other Ambulatory Visit (HOSPITAL_COMMUNITY): Payer: Self-pay

## 2022-12-21 ENCOUNTER — Other Ambulatory Visit (HOSPITAL_COMMUNITY): Payer: Self-pay

## 2022-12-22 ENCOUNTER — Encounter (INDEPENDENT_AMBULATORY_CARE_PROVIDER_SITE_OTHER): Payer: Self-pay | Admitting: Family Medicine

## 2022-12-22 ENCOUNTER — Other Ambulatory Visit (HOSPITAL_COMMUNITY): Payer: Self-pay

## 2022-12-25 ENCOUNTER — Other Ambulatory Visit (HOSPITAL_COMMUNITY): Payer: Self-pay

## 2022-12-26 NOTE — Telephone Encounter (Signed)
Please review

## 2022-12-26 NOTE — Telephone Encounter (Signed)
Patients insurance has been scanned in.

## 2022-12-26 NOTE — Progress Notes (Unsigned)
Chief Complaint:   OBESITY Laura [name] is here to discuss her progress with her obesity treatment plan along with follow-up of her obesity related diagnoses. [name] is on the Category 2 Plan and states she is following her eating plan approximately 50% of the time. [name] states she is doing abs exercises for 20 minutes 3 times per week.  Today's visit was #: 24 Starting weight: 168 lbs Starting date: 01/20/2022 Today's weight: 172 lbs Today's date: 12/19/2022 Total lbs lost to date: 0 Total lbs lost since last in-office visit: 0  Interim History: [name] did some celebration eating over the holidays.  She had been on Saxenda and Wegovy in the past.  Subjective:   1. Vitamin D deficiency [name]'s last vitamin D level was not yet at goal.  She is due for labs.  2. Mixed hyperlipidemia [name] is not on statin, and she is trying to improve her cholesterol levels with diet, exercise, and weight loss.  She is due for labs.  Assessment/Plan:   1. Vitamin D deficiency We will check labs today, and we will refill prescription vitamin D for 90 days.  - Vitamin D, Ergocalciferol, (DRISDOL) 1.25 MG (50000 UNIT) CAPS capsule; Take 1 capsule (50,000 Units total) by mouth every 14 (fourteen) days.  Dispense: 8 capsule; Refill: 0 - VITAMIN D 25 Hydroxy (Vit-D Deficiency, Fractures)  2. Mixed hyperlipidemia We will check labs today, and we will follow-up at her next visit.  Marliss will continue with her diet, exercise, and weight loss.  - CMP14+EGFR - Insulin, random - Lipid Panel With LDL/HDL Ratio - Hemoglobin A1c - TSH - CBC with Differential/Platelet  3. Obesity, Current BMI 31.5 We discussed various medication options to help Ira with her weight loss efforts and we both agreed to start Zepbound 5 mg once weekly with no refills.  - tirzepatide (ZEPBOUND) 5 MG/0.5ML Pen; Inject 5 mg into the skin once a week.  Dispense: 2 mL; Refill: 0  [name] is currently in the action  stage of change. As such, her goal is to continue with weight loss efforts. She has agreed to the Category 2 Plan.   Exercise goals: As is.   Behavioral modification strategies: increasing lean protein intake.  [name] has agreed to follow-up with our clinic in 4 weeks. She was informed of the importance of frequent follow-up visits to maximize her success with intensive lifestyle modifications for her multiple health conditions.   [name] was informed we would discuss her lab results at her next visit unless there is a critical issue that needs to be addressed sooner. [name] agreed to keep her next visit at the agreed upon time to discuss these results.  Objective:   Blood pressure 115/74, pulse 85, temperature 98 F (36.7 C), height '5\' 2"'$  (1.575 m), weight 172 lb (78 kg), SpO2 96 %. Body mass index is 31.46 kg/m.  General: Cooperative, alert, well developed, in no acute distress. HEENT: Conjunctivae and lids unremarkable. Cardiovascular: Regular rhythm.  Lungs: Normal work of breathing. Neurologic: No focal deficits.   Lab Results  Component Value Date   CREATININE 0.80 03/14/2022   BUN 14 03/14/2022   NA 142 03/14/2022   K 4.3 03/14/2022   CL 104 03/14/2022   CO2 23 03/14/2022   Lab Results  Component Value Date   ALT 21 03/14/2022   AST 12 03/14/2022   ALKPHOS 53 03/14/2022   BILITOT 0.3 03/14/2022   Lab Results  Component Value Date   HGBA1C 5.4 03/14/2022  HGBA1C 5.3 09/27/2021   HGBA1C 5.4 05/09/2021   HGBA1C 5.2 01/20/2021   Lab Results  Component Value Date   INSULIN 5.3 03/14/2022   INSULIN 6.0 09/27/2021   INSULIN 5.8 05/09/2021   INSULIN 11.9 01/20/2021   Lab Results  Component Value Date   TSH 1.480 01/20/2021   Lab Results  Component Value Date   CHOL 219 (H) 03/14/2022   HDL 60 03/14/2022   LDLCALC 149 (H) 03/14/2022   TRIG 57 03/14/2022   CHOLHDL 3.3 11/07/2017   Lab Results  Component Value Date   VD25OH 30.6 03/14/2022   VD25OH  54.8 09/27/2021   VD25OH 75.5 05/09/2021   Lab Results  Component Value Date   WBC 5.4 01/20/2021   HGB 13.7 01/20/2021   HCT 42.2 01/20/2021   MCV 92 01/20/2021   PLT 269 01/20/2021   No results found for: "IRON", "TIBC", "FERRITIN"  Attestation Statements:   Reviewed by clinician on day of visit: allergies, medications, problem list, medical history, surgical history, family history, social history, and previous encounter notes.   I, Trixie Dredge, am acting as transcriptionist for Dennard Nip, MD.  I have reviewed the above documentation for accuracy and completeness, and I agree with the above. -  Dennard Nip, MD

## 2022-12-28 ENCOUNTER — Telehealth (INDEPENDENT_AMBULATORY_CARE_PROVIDER_SITE_OTHER): Payer: Self-pay | Admitting: *Deleted

## 2022-12-28 ENCOUNTER — Other Ambulatory Visit (HOSPITAL_COMMUNITY): Payer: Self-pay

## 2022-12-28 NOTE — Telephone Encounter (Signed)
Prior authorization done via cover my meds for patients Zepbound. Waiting on determinaRadonna [name] (Key: [id number]) PA Case ID #: [id number] tion.

## 2023-01-02 ENCOUNTER — Other Ambulatory Visit (HOSPITAL_COMMUNITY): Payer: Self-pay

## 2023-01-08 DIAGNOSIS — E782 Mixed hyperlipidemia: Secondary | ICD-10-CM | POA: Diagnosis not present

## 2023-01-08 DIAGNOSIS — E559 Vitamin D deficiency, unspecified: Secondary | ICD-10-CM | POA: Diagnosis not present

## 2023-01-09 ENCOUNTER — Other Ambulatory Visit (HOSPITAL_COMMUNITY): Payer: Self-pay

## 2023-01-09 ENCOUNTER — Ambulatory Visit (INDEPENDENT_AMBULATORY_CARE_PROVIDER_SITE_OTHER): Payer: 59 | Admitting: Family Medicine

## 2023-01-09 ENCOUNTER — Encounter (INDEPENDENT_AMBULATORY_CARE_PROVIDER_SITE_OTHER): Payer: Self-pay | Admitting: Family Medicine

## 2023-01-09 VITALS — BP 110/72 | HR 83 | Temp 97.7°F | Ht 62.0 in | Wt 169.0 lb

## 2023-01-09 DIAGNOSIS — E7800 Pure hypercholesterolemia, unspecified: Secondary | ICD-10-CM | POA: Diagnosis not present

## 2023-01-09 DIAGNOSIS — Z6831 Body mass index (BMI) 31.0-31.9, adult: Secondary | ICD-10-CM

## 2023-01-09 DIAGNOSIS — E559 Vitamin D deficiency, unspecified: Secondary | ICD-10-CM | POA: Diagnosis not present

## 2023-01-09 DIAGNOSIS — E669 Obesity, unspecified: Secondary | ICD-10-CM

## 2023-01-09 LAB — CMP14+EGFR
ALT: 18 IU/L (ref 0–32)
AST: 15 IU/L (ref 0–40)
Albumin/Globulin Ratio: 1.6 (ref 1.2–2.2)
Albumin: 4.5 g/dL (ref 3.8–4.9)
Alkaline Phosphatase: 57 IU/L (ref 44–121)
BUN/Creatinine Ratio: 17 (ref 9–23)
BUN: 12 mg/dL (ref 6–24)
Bilirubin Total: 0.3 mg/dL (ref 0.0–1.2)
CO2: 23 mmol/L (ref 20–29)
Calcium: 10.1 mg/dL (ref 8.7–10.2)
Chloride: 100 mmol/L (ref 96–106)
Creatinine, Ser: 0.71 mg/dL (ref 0.57–1.00)
Globulin, Total: 2.9 g/dL (ref 1.5–4.5)
Glucose: 78 mg/dL (ref 70–99)
Potassium: 4 mmol/L (ref 3.5–5.2)
Sodium: 137 mmol/L (ref 134–144)
Total Protein: 7.4 g/dL (ref 6.0–8.5)
eGFR: 98 mL/min/{1.73_m2} (ref >59)

## 2023-01-09 LAB — CBC WITH DIFFERENTIAL/PLATELET
Basophils Absolute: 0 10*3/uL (ref 0.0–0.2)
Basos: 1 %
EOS (ABSOLUTE): 0.1 10*3/uL (ref 0.0–0.4)
Eos: 1 %
Hematocrit: 39.5 % (ref 34.0–46.6)
Hemoglobin: 13.2 g/dL (ref 11.1–15.9)
Immature Grans (Abs): 0 10*3/uL (ref 0.0–0.1)
Immature Granulocytes: 0 %
Lymphocytes Absolute: 1.5 10*3/uL (ref 0.7–3.1)
Lymphs: 34 %
MCH: 30.2 pg (ref 26.6–33.0)
MCHC: 33.4 g/dL (ref 31.5–35.7)
MCV: 90 fL (ref 79–97)
Monocytes Absolute: 0.4 10*3/uL (ref 0.1–0.9)
Monocytes: 8 %
Neutrophils Absolute: 2.5 10*3/uL (ref 1.4–7.0)
Neutrophils: 56 %
Platelets: 304 10*3/uL (ref 150–450)
RBC: 4.37 x10E6/uL (ref 3.77–5.28)
RDW: 11.6 % — ABNORMAL LOW (ref 11.7–15.4)
WBC: 4.4 10*3/uL (ref 3.4–10.8)

## 2023-01-09 LAB — LIPID PANEL WITH LDL/HDL RATIO
Cholesterol, Total: 275 mg/dL — ABNORMAL HIGH (ref 100–199)
HDL: 65 mg/dL (ref >39)
LDL Chol Calc (NIH): 202 mg/dL — ABNORMAL HIGH (ref 0–99)
LDL/HDL Ratio: 3.1 ratio (ref 0.0–3.2)
Triglycerides: 55 mg/dL (ref 0–149)
VLDL Cholesterol Cal: 8 mg/dL (ref 5–40)

## 2023-01-09 LAB — HEMOGLOBIN A1C
Est. average glucose Bld gHb Est-mCnc: 108 mg/dL
Hgb A1c MFr Bld: 5.4 % (ref 4.8–5.6)

## 2023-01-09 LAB — INSULIN, RANDOM: INSULIN: 8.6 u[IU]/mL (ref 2.6–24.9)

## 2023-01-09 LAB — VITAMIN D 25 HYDROXY (VIT D DEFICIENCY, FRACTURES): Vit D, 25-Hydroxy: 73.5 ng/mL (ref 30.0–100.0)

## 2023-01-09 LAB — TSH: TSH: 1.56 u[IU]/mL (ref 0.450–4.500)

## 2023-01-09 MED ORDER — ZEPBOUND 5 MG/0.5ML ~~LOC~~ SOAJ
5.0000 mg | SUBCUTANEOUS | 0 refills | Status: DC
  Filled 2023-01-09 – 2023-01-25 (×2): qty 2, 28d supply, fill #0

## 2023-01-09 MED ORDER — ROSUVASTATIN CALCIUM 5 MG PO TABS
5.0000 mg | ORAL_TABLET | Freq: Every day | ORAL | 0 refills | Status: DC
  Filled 2023-01-09: qty 30, 30d supply, fill #0

## 2023-01-09 MED ORDER — VITAMIN D (ERGOCALCIFEROL) 1.25 MG (50000 UNIT) PO CAPS
50000.0000 [IU] | ORAL_CAPSULE | ORAL | 0 refills | Status: DC
  Filled 2023-01-09: qty 8, 112d supply, fill #0
  Filled 2023-01-26 (×2): qty 6, 84d supply, fill #0

## 2023-01-10 ENCOUNTER — Other Ambulatory Visit (HOSPITAL_COMMUNITY): Payer: Self-pay

## 2023-01-23 NOTE — Progress Notes (Unsigned)
Chief Complaint:   OBESITY Laura [name] is here to discuss her progress with her obesity treatment plan along with follow-up of her obesity related diagnoses. Laura [name] is on the Category 2 Plan and states she is following her eating plan approximately 90% of the time. Laura [name] states she is doing floor exercise for 30 minutes 3 times per week.  Today's visit was #: 25 Starting weight: 168 lbs Starting date: 01/20/2022 Today's weight: 169 lbs Today's date: 01/09/2023 Total lbs lost to date: 0 Total lbs lost since last in-office visit: 3  Interim History: Laura [name] has done well with her weight loss. She noted some nausea initially but this is improving.   Subjective:   1. Vitamin D deficiency Laura [name]'s last Vitamin D level was at goal. She is now on Vitamin D prescription every 2 weeks.   2. Pure hypercholesterolemia Laura [name]'s last LDL was very elevated at 202. She has a family history of multiple CVA. She is now at high risk of CVA due to her elevated LDL.   Assessment/Plan:   1. Vitamin D deficiency Laura [name] will continue prescription vitamin D, and we will refill for 90 days.   - Vitamin D, Ergocalciferol, (DRISDOL) 1.25 MG (50000 UNIT) CAPS capsule; Take 1 capsule (50,000 Units total) by mouth every 14 (fourteen) days.  Dispense: 8 capsule; Refill: 0  2. Pure hypercholesterolemia Laura [name] agreed to start Crestor 5 mg qhs with no refills.  - rosuvastatin (CRESTOR) 5 MG tablet; Take 1 tablet (5 mg total) by mouth daily.  Dispense: 30 tablet; Refill: 0  3. BMI 31.0-31.9,adult  4. Obesity, Beginning BMI 30.73 We will refill Zepbound for 1 month. Ok to take after dinner to help decrease hunger.   - tirzepatide (ZEPBOUND) 5 MG/0.5ML Pen; Inject 5 mg into the skin once a week.  Dispense: 2 mL; Refill: 0  Laura [name] is currently in the action stage of change. As such, her goal is to continue with weight loss efforts. She has agreed to the Category 2 Plan.   Exercise goals: As is.    Behavioral modification strategies: meal planning and cooking strategies.  Laura [name] has agreed to follow-up with our clinic in 4 weeks. She was informed of the importance of frequent follow-up visits to maximize her success with intensive lifestyle modifications for her multiple health conditions.   Objective:   Blood pressure 110/72, pulse 83, temperature 97.7 F (36.5 C), height 5' 2"$  (1.575 m), weight 169 lb (76.7 kg), SpO2 97 %. Body mass index is 30.91 kg/m.  General: Cooperative, alert, well developed, in no acute distress. HEENT: Conjunctivae and lids unremarkable. Cardiovascular: Regular rhythm.  Lungs: Normal work of breathing. Neurologic: No focal deficits.   Lab Results  Component Value Date   CREATININE 0.71 01/08/2023   BUN 12 01/08/2023   NA 137 01/08/2023   K 4.0 01/08/2023   CL 100 01/08/2023   CO2 23 01/08/2023   Lab Results  Component Value Date   ALT 18 01/08/2023   AST 15 01/08/2023   ALKPHOS 57 01/08/2023   BILITOT 0.3 01/08/2023   Lab Results  Component Value Date   HGBA1C 5.4 01/08/2023   HGBA1C 5.4 03/14/2022   HGBA1C 5.3 09/27/2021   HGBA1C 5.4 05/09/2021   HGBA1C 5.2 01/20/2021   Lab Results  Component Value Date   INSULIN 8.6 01/08/2023   INSULIN 5.3 03/14/2022   INSULIN 6.0 09/27/2021   INSULIN 5.8 05/09/2021   INSULIN 11.9 01/20/2021   Lab Results  Component Value Date  TSH 1.560 01/08/2023   Lab Results  Component Value Date   CHOL 275 (H) 01/08/2023   HDL 65 01/08/2023   LDLCALC 202 (H) 01/08/2023   TRIG 55 01/08/2023   CHOLHDL 3.3 11/07/2017   Lab Results  Component Value Date   VD25OH 73.5 01/08/2023   VD25OH 30.6 03/14/2022   VD25OH 54.8 09/27/2021   Lab Results  Component Value Date   WBC 4.4 01/08/2023   HGB 13.2 01/08/2023   HCT 39.5 01/08/2023   MCV 90 01/08/2023   PLT 304 01/08/2023   No results found for: "IRON", "TIBC", "FERRITIN"  Attestation Statements:   Reviewed by clinician on day of  visit: allergies, medications, problem list, medical history, surgical history, family history, social history, and previous encounter notes.   I, Trixie Dredge, am acting as transcriptionist for Dennard Nip, MD.  I have reviewed the above documentation for accuracy and completeness, and I agree with the above. -  Dennard Nip, MD

## 2023-01-25 ENCOUNTER — Other Ambulatory Visit (HOSPITAL_BASED_OUTPATIENT_CLINIC_OR_DEPARTMENT_OTHER): Payer: Self-pay

## 2023-01-26 ENCOUNTER — Other Ambulatory Visit (INDEPENDENT_AMBULATORY_CARE_PROVIDER_SITE_OTHER): Payer: Self-pay | Admitting: Family Medicine

## 2023-01-26 ENCOUNTER — Other Ambulatory Visit (HOSPITAL_BASED_OUTPATIENT_CLINIC_OR_DEPARTMENT_OTHER): Payer: Self-pay

## 2023-01-26 ENCOUNTER — Other Ambulatory Visit (HOSPITAL_COMMUNITY): Payer: Self-pay

## 2023-01-26 DIAGNOSIS — E7800 Pure hypercholesterolemia, unspecified: Secondary | ICD-10-CM

## 2023-01-29 ENCOUNTER — Other Ambulatory Visit (HOSPITAL_COMMUNITY): Payer: Self-pay

## 2023-02-02 ENCOUNTER — Other Ambulatory Visit (INDEPENDENT_AMBULATORY_CARE_PROVIDER_SITE_OTHER): Payer: Self-pay | Admitting: Family Medicine

## 2023-02-02 ENCOUNTER — Other Ambulatory Visit (HOSPITAL_BASED_OUTPATIENT_CLINIC_OR_DEPARTMENT_OTHER): Payer: Self-pay

## 2023-02-02 DIAGNOSIS — E7800 Pure hypercholesterolemia, unspecified: Secondary | ICD-10-CM

## 2023-02-03 ENCOUNTER — Other Ambulatory Visit (HOSPITAL_BASED_OUTPATIENT_CLINIC_OR_DEPARTMENT_OTHER): Payer: Self-pay

## 2023-02-05 ENCOUNTER — Encounter (HOSPITAL_BASED_OUTPATIENT_CLINIC_OR_DEPARTMENT_OTHER): Payer: Self-pay | Admitting: Pharmacist

## 2023-02-05 ENCOUNTER — Ambulatory Visit (INDEPENDENT_AMBULATORY_CARE_PROVIDER_SITE_OTHER): Payer: 59 | Admitting: Family Medicine

## 2023-02-05 ENCOUNTER — Other Ambulatory Visit (HOSPITAL_BASED_OUTPATIENT_CLINIC_OR_DEPARTMENT_OTHER): Payer: Self-pay

## 2023-02-06 ENCOUNTER — Encounter (INDEPENDENT_AMBULATORY_CARE_PROVIDER_SITE_OTHER): Payer: Self-pay | Admitting: Family Medicine

## 2023-02-06 ENCOUNTER — Other Ambulatory Visit (HOSPITAL_BASED_OUTPATIENT_CLINIC_OR_DEPARTMENT_OTHER): Payer: Self-pay

## 2023-02-06 ENCOUNTER — Ambulatory Visit (INDEPENDENT_AMBULATORY_CARE_PROVIDER_SITE_OTHER): Payer: 59 | Admitting: Family Medicine

## 2023-02-06 VITALS — BP 93/65 | HR 85 | Temp 98.1°F | Ht 62.0 in | Wt 161.0 lb

## 2023-02-06 DIAGNOSIS — Z6829 Body mass index (BMI) 29.0-29.9, adult: Secondary | ICD-10-CM | POA: Diagnosis not present

## 2023-02-06 DIAGNOSIS — E559 Vitamin D deficiency, unspecified: Secondary | ICD-10-CM | POA: Diagnosis not present

## 2023-02-06 DIAGNOSIS — E669 Obesity, unspecified: Secondary | ICD-10-CM

## 2023-02-06 DIAGNOSIS — E7800 Pure hypercholesterolemia, unspecified: Secondary | ICD-10-CM

## 2023-02-06 DIAGNOSIS — E7849 Other hyperlipidemia: Secondary | ICD-10-CM | POA: Diagnosis not present

## 2023-02-06 MED ORDER — ZEPBOUND 5 MG/0.5ML ~~LOC~~ SOAJ
5.0000 mg | SUBCUTANEOUS | 0 refills | Status: DC
  Filled 2023-02-06: qty 6, 84d supply, fill #0
  Filled 2023-02-15 – 2023-02-16 (×2): qty 2, 28d supply, fill #0
  Filled 2023-03-11: qty 2, 28d supply, fill #1
  Filled ????-??-??: fill #1

## 2023-02-06 MED ORDER — ROSUVASTATIN CALCIUM 5 MG PO TABS
5.0000 mg | ORAL_TABLET | Freq: Every day | ORAL | 0 refills | Status: DC
  Filled 2023-02-06: qty 30, 30d supply, fill #0

## 2023-02-15 ENCOUNTER — Other Ambulatory Visit (HOSPITAL_BASED_OUTPATIENT_CLINIC_OR_DEPARTMENT_OTHER): Payer: Self-pay

## 2023-02-15 ENCOUNTER — Encounter (INDEPENDENT_AMBULATORY_CARE_PROVIDER_SITE_OTHER): Payer: Self-pay | Admitting: Family Medicine

## 2023-02-16 ENCOUNTER — Other Ambulatory Visit (HOSPITAL_COMMUNITY): Payer: Self-pay

## 2023-02-16 ENCOUNTER — Other Ambulatory Visit (HOSPITAL_BASED_OUTPATIENT_CLINIC_OR_DEPARTMENT_OTHER): Payer: Self-pay

## 2023-02-17 ENCOUNTER — Other Ambulatory Visit (HOSPITAL_BASED_OUTPATIENT_CLINIC_OR_DEPARTMENT_OTHER): Payer: Self-pay

## 2023-02-19 ENCOUNTER — Other Ambulatory Visit (HOSPITAL_BASED_OUTPATIENT_CLINIC_OR_DEPARTMENT_OTHER): Payer: Self-pay

## 2023-02-20 NOTE — Progress Notes (Signed)
Chief Complaint:   OBESITY Laura [name] is here to discuss her progress with her obesity treatment plan along with follow-up of her obesity related diagnoses. Laura [name] is on the Category 2 Plan and states she is following her eating plan approximately 90% of the time. Laura [name] states she is exercising for 60+ minutes 3 times per week.  Today's visit was #: 26 Starting weight: 168 lbs Starting date: 01/20/2021 Today's weight: 161 lbs Today's date: 02/06/2023 Total lbs lost to date: 7 Total lbs lost since last in-office visit: 8  Interim History: Laura [name] continues to do well with weight loss.  Her hunger is controlled but she is able to eat the healthy food she needs.  Subjective:   1. Other hyperlipidemia Laura [name] is working on decreasing cholesterol in her diet.  She has questions about eggs and cholesterol.  2. Vitamin D deficiency Laura [name] is on vitamin D prescription with no side effects noted.  Assessment/Plan:   1. Other hyperlipidemia Laura [name] will continue Crestor, and we will refill for 1 month.  Egg whites have low cholesterol and was discussed with the patient.  We will recheck labs in 2 months.  - rosuvastatin (CRESTOR) 5 MG tablet; Take 1 tablet (5 mg total) by mouth daily.  Dispense: 30 tablet; Refill: 0  2. Vitamin D deficiency Laura [name] will continue prescription vitamin D, and we will plan to recheck labs in 2 months.  3. Obesity, Beginning BMI 30.73  4. Current BMI 29.44 Laura [name] will continue Zepbound, and we will refill for 90 days.   - tirzepatide (ZEPBOUND) 5 MG/0.5ML Pen; Inject 5 mg into the skin once a week.  Dispense: 6 mL; Refill: 0  Laura [name] is currently in the action stage of change. As such, her goal is to continue with weight loss efforts. She has agreed to the Category 2 Plan.   Exercise goals: As is.   Behavioral modification strategies: increasing lean protein intake and increasing water intake.  Laura [name] has agreed to follow-up with our clinic in  4 weeks. She was informed of the importance of frequent follow-up visits to maximize her success with intensive lifestyle modifications for her multiple health conditions.   Objective:   Blood pressure 93/65, pulse 85, temperature 98.1 F (36.7 C), height 5\' 2"  (1.575 m), weight 161 lb (73 kg), SpO2 98 %. Body mass index is 29.45 kg/m.  Lab Results  Component Value Date   CREATININE 0.71 01/08/2023   BUN 12 01/08/2023   NA 137 01/08/2023   K 4.0 01/08/2023   CL 100 01/08/2023   CO2 23 01/08/2023   Lab Results  Component Value Date   ALT 18 01/08/2023   AST 15 01/08/2023   ALKPHOS 57 01/08/2023   BILITOT 0.3 01/08/2023   Lab Results  Component Value Date   HGBA1C 5.4 01/08/2023   HGBA1C 5.4 03/14/2022   HGBA1C 5.3 09/27/2021   HGBA1C 5.4 05/09/2021   HGBA1C 5.2 01/20/2021   Lab Results  Component Value Date   INSULIN 8.6 01/08/2023   INSULIN 5.3 03/14/2022   INSULIN 6.0 09/27/2021   INSULIN 5.8 05/09/2021   INSULIN 11.9 01/20/2021   Lab Results  Component Value Date   TSH 1.560 01/08/2023   Lab Results  Component Value Date   CHOL 275 (H) 01/08/2023   HDL 65 01/08/2023   LDLCALC 202 (H) 01/08/2023   TRIG 55 01/08/2023   CHOLHDL 3.3 11/07/2017   Lab Results  Component Value Date   VD25OH 73.5 01/08/2023   VD25OH  30.6 03/14/2022   VD25OH 54.8 09/27/2021   Lab Results  Component Value Date   WBC 4.4 01/08/2023   HGB 13.2 01/08/2023   HCT 39.5 01/08/2023   MCV 90 01/08/2023   PLT 304 01/08/2023   No results found for: "IRON", "TIBC", "FERRITIN"  Attestation Statements:   Reviewed by clinician on day of visit: allergies, medications, problem list, medical history, surgical history, family history, social history, and previous encounter notes.   I, Trixie Dredge, am acting as transcriptionist for Dennard Nip, MD.  I have reviewed the above documentation for accuracy and completeness, and I agree with the above. -  Dennard Nip, MD

## 2023-03-06 ENCOUNTER — Ambulatory Visit (INDEPENDENT_AMBULATORY_CARE_PROVIDER_SITE_OTHER): Payer: 59 | Admitting: Family Medicine

## 2023-03-11 ENCOUNTER — Other Ambulatory Visit (HOSPITAL_BASED_OUTPATIENT_CLINIC_OR_DEPARTMENT_OTHER): Payer: Self-pay

## 2023-03-11 ENCOUNTER — Other Ambulatory Visit (INDEPENDENT_AMBULATORY_CARE_PROVIDER_SITE_OTHER): Payer: Self-pay | Admitting: Family Medicine

## 2023-03-11 ENCOUNTER — Encounter (HOSPITAL_BASED_OUTPATIENT_CLINIC_OR_DEPARTMENT_OTHER): Payer: Self-pay | Admitting: Pharmacist

## 2023-03-11 DIAGNOSIS — E7849 Other hyperlipidemia: Secondary | ICD-10-CM

## 2023-03-12 ENCOUNTER — Other Ambulatory Visit (HOSPITAL_COMMUNITY): Payer: Self-pay

## 2023-03-13 ENCOUNTER — Other Ambulatory Visit (HOSPITAL_COMMUNITY): Payer: Self-pay

## 2023-03-13 ENCOUNTER — Ambulatory Visit (INDEPENDENT_AMBULATORY_CARE_PROVIDER_SITE_OTHER): Payer: 59 | Admitting: Family Medicine

## 2023-03-13 ENCOUNTER — Encounter (HOSPITAL_COMMUNITY): Payer: Self-pay

## 2023-03-13 ENCOUNTER — Other Ambulatory Visit (HOSPITAL_BASED_OUTPATIENT_CLINIC_OR_DEPARTMENT_OTHER): Payer: Self-pay

## 2023-03-13 ENCOUNTER — Encounter (INDEPENDENT_AMBULATORY_CARE_PROVIDER_SITE_OTHER): Payer: Self-pay | Admitting: Family Medicine

## 2023-03-13 VITALS — BP 100/65 | HR 89 | Temp 97.9°F | Ht 62.0 in | Wt 159.0 lb

## 2023-03-13 DIAGNOSIS — Z6829 Body mass index (BMI) 29.0-29.9, adult: Secondary | ICD-10-CM

## 2023-03-13 DIAGNOSIS — E7849 Other hyperlipidemia: Secondary | ICD-10-CM

## 2023-03-13 DIAGNOSIS — E669 Obesity, unspecified: Secondary | ICD-10-CM

## 2023-03-13 DIAGNOSIS — E559 Vitamin D deficiency, unspecified: Secondary | ICD-10-CM

## 2023-03-13 MED ORDER — ROSUVASTATIN CALCIUM 5 MG PO TABS
5.0000 mg | ORAL_TABLET | Freq: Every day | ORAL | 0 refills | Status: DC
  Filled 2023-03-13: qty 30, 30d supply, fill #0

## 2023-03-13 MED ORDER — VITAMIN D (ERGOCALCIFEROL) 1.25 MG (50000 UNIT) PO CAPS
50000.0000 [IU] | ORAL_CAPSULE | ORAL | 0 refills | Status: DC
  Filled 2023-03-13: qty 8, 112d supply, fill #0

## 2023-03-13 MED ORDER — ZEPBOUND 5 MG/0.5ML ~~LOC~~ SOAJ
5.0000 mg | SUBCUTANEOUS | 0 refills | Status: DC
  Filled 2023-03-13 – 2023-03-14 (×2): qty 2, 28d supply, fill #0
  Filled 2023-03-31: qty 2, 28d supply, fill #1
  Filled ????-??-??: fill #1

## 2023-03-14 ENCOUNTER — Other Ambulatory Visit (HOSPITAL_BASED_OUTPATIENT_CLINIC_OR_DEPARTMENT_OTHER): Payer: Self-pay

## 2023-03-14 NOTE — Progress Notes (Signed)
Chief Complaint:   OBESITY Laura [name] is here to discuss her progress with her obesity treatment plan along with follow-up of her obesity related diagnoses. Adaley is on the Category 2 Plan and states she is following her eating plan approximately 90% of the time. Vincy states she is playing pickle ball, gym for 90-120 minutes 3-4 times per week.  Today's visit was #: 27 Starting weight: 168 lbs Starting date: 01/20/2021 Today's weight: 159 lbs Today's date: 03/13/2023 Total lbs lost to date: 9 Total lbs lost since last in-office visit: 2  Interim History: Laura [name] continues to do well with weight loss.  She is exercising regularly and her hunger is appropriate.  She is tolerating Zepbound well.   Subjective:   1. Vitamin D deficiency Laura [name]'s Vitamin D level was at goal on Vitamin D prescription. She denies nausea or vomiting.   2. Other hyperlipidemia Laura [name] is on Crestor as her last LDL was very elevated at 202.  No side effects were noted.  Assessment/Plan:   1. Vitamin D deficiency Laura [name] will continue prescription vitamin D 50,000 units once every 14 days, and we will refill for 4 months.   - Vitamin D, Ergocalciferol, (DRISDOL) 1.25 MG (50000 UNIT) CAPS capsule; Take 1 capsule (50,000 Units total) by mouth every 14 (fourteen) days.  Dispense: 8 capsule; Refill: 0  2. Other hyperlipidemia Laura [name] will continue her medications, and we will continue with her low-cholesterol diet.  We will refill Crestor for 1 month.  - rosuvastatin (CRESTOR) 5 MG tablet; Take 1 tablet (5 mg total) by mouth daily.  Dispense: 30 tablet; Refill: 0  3. Current BMI 29.44  4. Obesity, Beginning BMI 30.73 Francile will continue Zepbound 5 mg once weekly, and we will refill for 1 month.   - tirzepatide (ZEPBOUND) 5 MG/0.5ML Pen; Inject 5 mg into the skin once a week.  Dispense: 6 mL; Refill: 0  Laura [name] is currently in the action stage of change. As such, her goal is to continue with weight  loss efforts. She has agreed to the Category 2 Plan.   Exercise goals: As is.   Behavioral modification strategies: meal planning and cooking strategies and keeping healthy foods in the home.  Laura [name] has agreed to follow-up with our clinic in 4 weeks. She was informed of the importance of frequent follow-up visits to maximize her success with intensive lifestyle modifications for her multiple health conditions.   Objective:   Blood pressure 100/65, pulse 89, temperature 97.9 F (36.6 C), height 5\' 2"  (1.575 m), weight 159 lb (72.1 kg), SpO2 99 %. Body mass index is 29.08 kg/m.  Lab Results  Component Value Date   CREATININE 0.71 01/08/2023   BUN 12 01/08/2023   NA 137 01/08/2023   K 4.0 01/08/2023   CL 100 01/08/2023   CO2 23 01/08/2023   Lab Results  Component Value Date   ALT 18 01/08/2023   AST 15 01/08/2023   ALKPHOS 57 01/08/2023   BILITOT 0.3 01/08/2023   Lab Results  Component Value Date   HGBA1C 5.4 01/08/2023   HGBA1C 5.4 03/14/2022   HGBA1C 5.3 09/27/2021   HGBA1C 5.4 05/09/2021   HGBA1C 5.2 01/20/2021   Lab Results  Component Value Date   INSULIN 8.6 01/08/2023   INSULIN 5.3 03/14/2022   INSULIN 6.0 09/27/2021   INSULIN 5.8 05/09/2021   INSULIN 11.9 01/20/2021   Lab Results  Component Value Date   TSH 1.560 01/08/2023   Lab Results  Component Value  Date   CHOL 275 (H) 01/08/2023   HDL 65 01/08/2023   LDLCALC 202 (H) 01/08/2023   TRIG 55 01/08/2023   CHOLHDL 3.3 11/07/2017   Lab Results  Component Value Date   VD25OH 73.5 01/08/2023   VD25OH 30.6 03/14/2022   VD25OH 54.8 09/27/2021   Lab Results  Component Value Date   WBC 4.4 01/08/2023   HGB 13.2 01/08/2023   HCT 39.5 01/08/2023   MCV 90 01/08/2023   PLT 304 01/08/2023   No results found for: "IRON", "TIBC", "FERRITIN"  Attestation Statements:   Reviewed by clinician on day of visit: allergies, medications, problem list, medical history, surgical history, family history, social  history, and previous encounter notes.   I, Burt Knack, am acting as transcriptionist for Quillian Quince, MD.  I have reviewed the above documentation for accuracy and completeness, and I agree with the above. -  Quillian Quince, MD

## 2023-03-16 ENCOUNTER — Other Ambulatory Visit: Payer: Self-pay

## 2023-03-19 ENCOUNTER — Other Ambulatory Visit (HOSPITAL_COMMUNITY): Payer: Self-pay

## 2023-03-20 ENCOUNTER — Other Ambulatory Visit (HOSPITAL_BASED_OUTPATIENT_CLINIC_OR_DEPARTMENT_OTHER): Payer: Self-pay

## 2023-04-01 ENCOUNTER — Other Ambulatory Visit (HOSPITAL_BASED_OUTPATIENT_CLINIC_OR_DEPARTMENT_OTHER): Payer: Self-pay

## 2023-04-03 ENCOUNTER — Ambulatory Visit (INDEPENDENT_AMBULATORY_CARE_PROVIDER_SITE_OTHER): Payer: 59 | Admitting: Family Medicine

## 2023-04-10 ENCOUNTER — Other Ambulatory Visit: Payer: Self-pay

## 2023-04-13 ENCOUNTER — Other Ambulatory Visit (INDEPENDENT_AMBULATORY_CARE_PROVIDER_SITE_OTHER): Payer: Self-pay | Admitting: Family Medicine

## 2023-04-13 ENCOUNTER — Other Ambulatory Visit (HOSPITAL_BASED_OUTPATIENT_CLINIC_OR_DEPARTMENT_OTHER): Payer: Self-pay

## 2023-04-13 DIAGNOSIS — E7849 Other hyperlipidemia: Secondary | ICD-10-CM

## 2023-04-16 ENCOUNTER — Other Ambulatory Visit (HOSPITAL_BASED_OUTPATIENT_CLINIC_OR_DEPARTMENT_OTHER): Payer: Self-pay

## 2023-04-17 ENCOUNTER — Encounter (HOSPITAL_BASED_OUTPATIENT_CLINIC_OR_DEPARTMENT_OTHER): Payer: Self-pay

## 2023-04-17 ENCOUNTER — Encounter (INDEPENDENT_AMBULATORY_CARE_PROVIDER_SITE_OTHER): Payer: Self-pay | Admitting: Family Medicine

## 2023-04-17 ENCOUNTER — Other Ambulatory Visit (HOSPITAL_BASED_OUTPATIENT_CLINIC_OR_DEPARTMENT_OTHER): Payer: Self-pay

## 2023-04-17 ENCOUNTER — Ambulatory Visit (INDEPENDENT_AMBULATORY_CARE_PROVIDER_SITE_OTHER): Payer: 59 | Admitting: Family Medicine

## 2023-04-17 VITALS — BP 108/71 | HR 80 | Temp 98.4°F | Ht 62.0 in | Wt 156.0 lb

## 2023-04-17 DIAGNOSIS — Z6828 Body mass index (BMI) 28.0-28.9, adult: Secondary | ICD-10-CM | POA: Diagnosis not present

## 2023-04-17 DIAGNOSIS — E559 Vitamin D deficiency, unspecified: Secondary | ICD-10-CM

## 2023-04-17 DIAGNOSIS — F439 Reaction to severe stress, unspecified: Secondary | ICD-10-CM | POA: Insufficient documentation

## 2023-04-17 DIAGNOSIS — E669 Obesity, unspecified: Secondary | ICD-10-CM

## 2023-04-17 DIAGNOSIS — Z6827 Body mass index (BMI) 27.0-27.9, adult: Secondary | ICD-10-CM | POA: Insufficient documentation

## 2023-04-17 DIAGNOSIS — R632 Polyphagia: Secondary | ICD-10-CM | POA: Diagnosis not present

## 2023-04-17 DIAGNOSIS — E7849 Other hyperlipidemia: Secondary | ICD-10-CM

## 2023-04-17 MED ORDER — ROSUVASTATIN CALCIUM 5 MG PO TABS
5.0000 mg | ORAL_TABLET | Freq: Every day | ORAL | 0 refills | Status: DC
  Filled 2023-04-17: qty 90, 90d supply, fill #0

## 2023-04-17 MED ORDER — VITAMIN D (ERGOCALCIFEROL) 1.25 MG (50000 UNIT) PO CAPS
50000.0000 [IU] | ORAL_CAPSULE | ORAL | 0 refills | Status: DC
  Filled 2023-04-17: qty 6, 84d supply, fill #0

## 2023-04-17 MED ORDER — ZEPBOUND 5 MG/0.5ML ~~LOC~~ SOAJ
5.0000 mg | SUBCUTANEOUS | 0 refills | Status: DC
  Filled 2023-04-17 – 2023-04-26 (×5): qty 2, 28d supply, fill #0

## 2023-04-18 NOTE — Progress Notes (Signed)
Chief Complaint:   OBESITY Laura [name] is here to discuss her progress with her obesity treatment plan along with follow-up of her obesity related diagnoses. Laura [name] is on the Category 2 Plan and states she is following her eating plan approximately 90% of the time. Laura [name] states she is playing pickle ball for 2 hours 2 times per week.   Today's visit was #: 28 Starting weight: 168 lbs Starting date: 01/20/2021 Today's weight: 156 lbs Today's date: 04/17/2023 Total lbs lost to date: 12 Total lbs lost since last in-office visit: 0  Interim History: Laura [name] has struggled with increased stress recently.  She notes job changes and her comfort eating has increased.  She is working on stopping her weight gain.  Subjective:   1. Stress Laura [name] has a lot of changes at her job and her stress is high.  She notes some increase in comfort eating.  2. Other hyperlipidemia Laura [name] is on Crestor, and she is working on her diet.  She will be changing insurance and she needs a 90-day prescription.  3. Polyphagia Laura [name] is doing well on Zepbound, but she may not be able to afford it depending on her new job's insurance.   4. Vitamin D deficiency Laura [name] is on Vitamin D once every 2 weeks. She denies nausea, vomiting, or muscle weakness.   Assessment/Plan:   1. Stress Laura [name] was offered support and encouragement.  She was encouraged to prioritize her health to help her through her changes.  We will continue to monitor.  2. Other hyperlipidemia Laura [name] will continue Crestor, and we will refill for 90 days.  - rosuvastatin (CRESTOR) 5 MG tablet; Take 1 tablet (5 mg total) by mouth daily.  Dispense: 90 tablet; Refill: 0  3. Polyphagia We will refill Zepbound for 90 days. Laura [name] will continue to work on her diet in the meanwhile.  4. Vitamin D deficiency Laura [name] will continue prescription vitamin D, and we will refill for 90 days.  - Vitamin D, Ergocalciferol, (DRISDOL) 1.25 MG (50000  UNIT) CAPS capsule; Take 1 capsule (50,000 Units total) by mouth every 14 (fourteen) days.  Dispense: 8 capsule; Refill: 0  5. BMI 28.0-28.9,adult  6. Obesity, Beginning BMI 30.73 We will refill Zepbound for 90 days.   - tirzepatide (ZEPBOUND) 5 MG/0.5ML Pen; Inject 5 mg into the skin once a week.  Dispense: 6 mL; Refill: 0  Laura [name] is currently in the action stage of change. As such, her goal is to continue with weight loss efforts. She has agreed to the Category 2 Plan.   Exercise goals: As is.  Behavioral modification strategies: increasing lean protein intake, no skipping meals, and emotional eating strategies.  Laura [name] has agreed to follow-up with our clinic in 8 weeks. She was informed of the importance of frequent follow-up visits to maximize her success with intensive lifestyle modifications for her multiple health conditions.   Objective:   Blood pressure 108/71, pulse 80, temperature 98.4 F (36.9 C), height 5\' 2"  (1.575 m), weight 156 lb (70.8 kg), SpO2 99 %. Body mass index is 28.53 kg/m.  Lab Results  Component Value Date   CREATININE 0.71 01/08/2023   BUN 12 01/08/2023   NA 137 01/08/2023   K 4.0 01/08/2023   CL 100 01/08/2023   CO2 23 01/08/2023   Lab Results  Component Value Date   ALT 18 01/08/2023   AST 15 01/08/2023   ALKPHOS 57 01/08/2023   BILITOT 0.3 01/08/2023   Lab Results  Component Value Date  HGBA1C 5.4 01/08/2023   HGBA1C 5.4 03/14/2022   HGBA1C 5.3 09/27/2021   HGBA1C 5.4 05/09/2021   HGBA1C 5.2 01/20/2021   Lab Results  Component Value Date   INSULIN 8.6 01/08/2023   INSULIN 5.3 03/14/2022   INSULIN 6.0 09/27/2021   INSULIN 5.8 05/09/2021   INSULIN 11.9 01/20/2021   Lab Results  Component Value Date   TSH 1.560 01/08/2023   Lab Results  Component Value Date   CHOL 275 (H) 01/08/2023   HDL 65 01/08/2023   LDLCALC 202 (H) 01/08/2023   TRIG 55 01/08/2023   CHOLHDL 3.3 11/07/2017   Lab Results  Component Value Date    VD25OH 73.5 01/08/2023   VD25OH 30.6 03/14/2022   VD25OH 54.8 09/27/2021   Lab Results  Component Value Date   WBC 4.4 01/08/2023   HGB 13.2 01/08/2023   HCT 39.5 01/08/2023   MCV 90 01/08/2023   PLT 304 01/08/2023   No results found for: "IRON", "TIBC", "FERRITIN"  Attestation Statements:   Reviewed by clinician on day of visit: allergies, medications, problem list, medical history, surgical history, family history, social history, and previous encounter notes.   I, Burt Knack, am acting as transcriptionist for Quillian Quince, MD.  I have reviewed the above documentation for accuracy and completeness, and I agree with the above. -  Quillian Quince, MD

## 2023-04-20 ENCOUNTER — Encounter (INDEPENDENT_AMBULATORY_CARE_PROVIDER_SITE_OTHER): Payer: Self-pay | Admitting: Family Medicine

## 2023-04-20 ENCOUNTER — Other Ambulatory Visit (HOSPITAL_BASED_OUTPATIENT_CLINIC_OR_DEPARTMENT_OTHER): Payer: Self-pay

## 2023-04-26 ENCOUNTER — Other Ambulatory Visit (HOSPITAL_BASED_OUTPATIENT_CLINIC_OR_DEPARTMENT_OTHER): Payer: Self-pay

## 2023-05-15 ENCOUNTER — Ambulatory Visit (INDEPENDENT_AMBULATORY_CARE_PROVIDER_SITE_OTHER): Payer: 59 | Admitting: Family Medicine

## 2023-06-02 ENCOUNTER — Other Ambulatory Visit (HOSPITAL_COMMUNITY): Payer: Self-pay

## 2023-06-04 ENCOUNTER — Other Ambulatory Visit (HOSPITAL_COMMUNITY): Payer: Self-pay

## 2023-06-04 MED ORDER — ESTRADIOL 0.05 MG/24HR TD PTWK
0.0500 mg | MEDICATED_PATCH | TRANSDERMAL | 2 refills | Status: DC
  Filled 2023-06-04: qty 12, 84d supply, fill #0
  Filled 2023-06-05: qty 4, 28d supply, fill #0
  Filled 2023-06-30: qty 4, 28d supply, fill #1
  Filled 2023-07-23: qty 12, 84d supply, fill #0
  Filled 2023-10-29: qty 12, 84d supply, fill #1
  Filled 2024-01-21: qty 4, 28d supply, fill #2

## 2023-06-05 ENCOUNTER — Other Ambulatory Visit (HOSPITAL_BASED_OUTPATIENT_CLINIC_OR_DEPARTMENT_OTHER): Payer: Self-pay

## 2023-06-05 ENCOUNTER — Other Ambulatory Visit (HOSPITAL_COMMUNITY): Payer: Self-pay

## 2023-06-08 ENCOUNTER — Other Ambulatory Visit (HOSPITAL_BASED_OUTPATIENT_CLINIC_OR_DEPARTMENT_OTHER): Payer: Self-pay

## 2023-06-11 ENCOUNTER — Telehealth (INDEPENDENT_AMBULATORY_CARE_PROVIDER_SITE_OTHER): Payer: Self-pay | Admitting: Family Medicine

## 2023-06-11 NOTE — Telephone Encounter (Signed)
pt called to reschedule 06/11/23 appt. was rescheduled to 06/14/23 at 8:40 and she is wondering if she should be fasting she thinks it should be time for her to have fasting lab. Would like someone to notify her. VW

## 2023-06-12 ENCOUNTER — Ambulatory Visit (INDEPENDENT_AMBULATORY_CARE_PROVIDER_SITE_OTHER): Payer: 59 | Admitting: Family Medicine

## 2023-06-13 NOTE — Telephone Encounter (Signed)
Left message on patient's voicemail stating she does not need to be fasting when she comes in for her appointment tomorrow.

## 2023-06-14 ENCOUNTER — Other Ambulatory Visit (HOSPITAL_BASED_OUTPATIENT_CLINIC_OR_DEPARTMENT_OTHER): Payer: Self-pay

## 2023-06-14 ENCOUNTER — Encounter (INDEPENDENT_AMBULATORY_CARE_PROVIDER_SITE_OTHER): Payer: Self-pay | Admitting: Family Medicine

## 2023-06-14 ENCOUNTER — Ambulatory Visit (INDEPENDENT_AMBULATORY_CARE_PROVIDER_SITE_OTHER): Payer: Managed Care, Other (non HMO) | Admitting: Family Medicine

## 2023-06-14 VITALS — BP 104/66 | HR 89 | Temp 97.9°F | Ht 62.0 in | Wt 151.0 lb

## 2023-06-14 DIAGNOSIS — E785 Hyperlipidemia, unspecified: Secondary | ICD-10-CM

## 2023-06-14 DIAGNOSIS — E559 Vitamin D deficiency, unspecified: Secondary | ICD-10-CM

## 2023-06-14 DIAGNOSIS — E7849 Other hyperlipidemia: Secondary | ICD-10-CM

## 2023-06-14 DIAGNOSIS — Z6827 Body mass index (BMI) 27.0-27.9, adult: Secondary | ICD-10-CM

## 2023-06-14 DIAGNOSIS — E669 Obesity, unspecified: Secondary | ICD-10-CM | POA: Diagnosis not present

## 2023-06-14 MED ORDER — ROSUVASTATIN CALCIUM 5 MG PO TABS
5.0000 mg | ORAL_TABLET | Freq: Every day | ORAL | 0 refills | Status: DC
Start: 2023-06-14 — End: 2023-07-19
  Filled 2023-06-14: qty 34, 34d supply, fill #0

## 2023-06-14 MED ORDER — VITAMIN D (ERGOCALCIFEROL) 1.25 MG (50000 UNIT) PO CAPS
50000.0000 [IU] | ORAL_CAPSULE | ORAL | 0 refills | Status: DC
Start: 2023-06-14 — End: 2023-07-19
  Filled 2023-06-14: qty 2, 28d supply, fill #0

## 2023-06-14 NOTE — Progress Notes (Signed)
[facility]  Office: [phone]  /  Fax: [phone]  WEIGHT SUMMARY AND BIOMETRICS  Anthropometric Measurements Height: 5\' 2"  (1.575 m) Weight: 151 lb (68.5 kg) BMI (Calculated): 27.61 Weight at Last Visit: 156 lb Weight Lost Since Last Visit: 5 lb Weight Gained Since Last Visit: 0 Starting Weight: 168 lb Total Weight Loss (lbs): 17 lb (7.711 kg)   Body Composition  Body Fat %: 34.8 % Fat Mass (lbs): 52.8 lbs Muscle Mass (lbs): 93.8 lbs Total Body Water (lbs): 63.4 lbs Visceral Fat Rating : 8   Other Clinical Data Fasting: No Labs: No Today's Visit #: 41 Starting Date: 01/20/21    Chief Complaint: OBESITY    History of Present Illness   The patient is a 57 year old individual with a history of obesity and vitamin D deficiency. She has been following a category two plan for weight loss and has lost five pounds in the last two months. She has been adhering to the plan approximately 80% of the time. As part of her exercise regimen, she has been playing pickleball for 60 minutes three times per week. She is currently on prescription vitamin D, 50,000 international units, taken orally every two weeks.  The patient reports not feeling particularly hungry and often forgets to eat, especially dinner. She has been considering supplementing her diet with protein drinks, but express concern about potential side effects such as gas. She has been taking Crestor for cholesterol management and Zetban for obesity, but the latter has recently run out and she has not refilled it due to cost concerns.  The patient has been managing well with the heat while playing pickleball, ensuring she stays hydrated and plays in shaded areas. She has been taking extra protein in the form of chicken before playing pickleball. She has also been considering purchasing protein drinks to supplement her diet. She has not been fasting for her lab tests, but plans to do so for the next round of tests. She has been  taking her vitamin D supplement every other week as prescribed.          PHYSICAL EXAM:  Blood pressure 104/66, pulse 89, temperature 97.9 F (36.6 C), height 5\' 2"  (1.575 m), weight 151 lb (68.5 kg), SpO2 96%. Body mass index is 27.62 kg/m.  DIAGNOSTIC DATA REVIEWED:  BMET    Component Value Date/Time   NA 137 01/08/2023 0923   K 4.0 01/08/2023 0923   CL 100 01/08/2023 0923   CO2 23 01/08/2023 0923   GLUCOSE 78 01/08/2023 0923   BUN 12 01/08/2023 0923   CREATININE 0.71 01/08/2023 0923   CALCIUM 10.1 01/08/2023 0923   GFRNONAA 97 01/20/2021 1036   GFRAA 112 01/20/2021 1036   Lab Results  Component Value Date   HGBA1C 5.4 01/08/2023   HGBA1C 5.2 01/20/2021   Lab Results  Component Value Date   INSULIN 8.6 01/08/2023   INSULIN 11.9 01/20/2021   Lab Results  Component Value Date   TSH 1.560 01/08/2023   CBC    Component Value Date/Time   WBC 4.4 01/08/2023 0923   WBC 5.8 07/15/2019 0938   RBC 4.37 01/08/2023 0923   RBC 4.14 07/15/2019 0938   HGB 13.2 01/08/2023 0923   HCT 39.5 01/08/2023 0923   PLT 304 01/08/2023 0923   MCV 90 01/08/2023 0923   MCH 30.2 01/08/2023 0923   MCH 29.7 07/15/2019 0938   MCHC 33.4 01/08/2023 0923   MCHC 32.5 07/15/2019 0938   RDW 11.6 (L) 01/08/2023 2956  Iron Studies No results found for: "IRON", "TIBC", "FERRITIN", "IRONPCTSAT" Lipid Panel     Component Value Date/Time   CHOL 275 (H) 01/08/2023 0923   TRIG 55 01/08/2023 0923   HDL 65 01/08/2023 0923   CHOLHDL 3.3 11/07/2017 0832   LDLCALC 202 (H) 01/08/2023 0923   LDLCALC 164 (H) 11/07/2017 0832   Hepatic Function Panel     Component Value Date/Time   PROT 7.4 01/08/2023 0923   ALBUMIN 4.5 01/08/2023 0923   AST 15 01/08/2023 0923   ALT 18 01/08/2023 0923   ALKPHOS 57 01/08/2023 0923   BILITOT 0.3 01/08/2023 0923      Component Value Date/Time   TSH 1.560 01/08/2023 0923   Nutritional Lab Results  Component Value Date   VD25OH 73.5 01/08/2023   VD25OH  30.6 03/14/2022   VD25OH 54.8 09/27/2021     Assessment and Plan    Obesity: 5 pound weight loss over the past 2 months with adherence to category 2 plan and regular exercise (pickleball). Discussed the importance of maintaining adequate protein intake to prevent muscle mass loss and drop in metabolism. Patient has stopped Zepbound due to cost and will continue without it. -Encouraged to continue current diet and exercise regimen. -Consider adding a protein drink ([employer]) to daily intake, particularly in the evening. -Discontinue Zepbound.  Vitamin D deficiency: On prescription Vitamin D 50,000 international units every 2 weeks. Discussed the risk of over-replacement and need for regular monitoring. -Refill Vitamin D prescription. -Plan to check Vitamin D level at next visit.  Hyperlipidemia: On Crestor. Last LDL improved. -Refill Crestor prescription. -Check lipid panel at next visit.  General Health Maintenance: -Plan to check fasting labs at next visit.        She was informed of the importance of frequent follow up visits to maximize her success with intensive lifestyle modifications for her multiple health conditions.    Quillian Quince, MD

## 2023-06-19 ENCOUNTER — Other Ambulatory Visit: Payer: Self-pay

## 2023-07-19 ENCOUNTER — Ambulatory Visit (INDEPENDENT_AMBULATORY_CARE_PROVIDER_SITE_OTHER): Payer: Managed Care, Other (non HMO) | Admitting: Family Medicine

## 2023-07-19 ENCOUNTER — Encounter (INDEPENDENT_AMBULATORY_CARE_PROVIDER_SITE_OTHER): Payer: Self-pay | Admitting: Family Medicine

## 2023-07-19 ENCOUNTER — Other Ambulatory Visit (HOSPITAL_BASED_OUTPATIENT_CLINIC_OR_DEPARTMENT_OTHER): Payer: Self-pay

## 2023-07-19 VITALS — BP 96/60 | HR 76 | Temp 98.1°F | Ht 62.0 in | Wt 150.0 lb

## 2023-07-19 DIAGNOSIS — E559 Vitamin D deficiency, unspecified: Secondary | ICD-10-CM

## 2023-07-19 DIAGNOSIS — E669 Obesity, unspecified: Secondary | ICD-10-CM | POA: Diagnosis not present

## 2023-07-19 DIAGNOSIS — Z6827 Body mass index (BMI) 27.0-27.9, adult: Secondary | ICD-10-CM

## 2023-07-19 DIAGNOSIS — E88819 Insulin resistance, unspecified: Secondary | ICD-10-CM | POA: Diagnosis not present

## 2023-07-19 DIAGNOSIS — E7849 Other hyperlipidemia: Secondary | ICD-10-CM

## 2023-07-19 MED ORDER — ROSUVASTATIN CALCIUM 5 MG PO TABS
5.0000 mg | ORAL_TABLET | Freq: Every day | ORAL | 0 refills | Status: DC
Start: 2023-07-19 — End: 2023-07-25
  Filled 2023-07-19: qty 30, 30d supply, fill #0
  Filled 2023-07-23: qty 90, 90d supply, fill #0

## 2023-07-19 MED ORDER — VITAMIN D (ERGOCALCIFEROL) 1.25 MG (50000 UNIT) PO CAPS
50000.0000 [IU] | ORAL_CAPSULE | ORAL | 0 refills | Status: DC
Start: 2023-07-19 — End: 2023-10-17
  Filled 2023-07-19: qty 2, 28d supply, fill #0
  Filled 2023-07-23: qty 6, 84d supply, fill #0

## 2023-07-20 ENCOUNTER — Encounter (INDEPENDENT_AMBULATORY_CARE_PROVIDER_SITE_OTHER): Payer: Self-pay | Admitting: Family Medicine

## 2023-07-20 LAB — CMP14+EGFR
ALT: 19 IU/L (ref 0–32)
AST: 14 IU/L (ref 0–40)
Albumin: 4.3 g/dL (ref 3.8–4.9)
Alkaline Phosphatase: 61 IU/L (ref 44–121)
BUN/Creatinine Ratio: 22 (ref 9–23)
BUN: 15 mg/dL (ref 6–24)
Bilirubin Total: 0.3 mg/dL (ref 0.0–1.2)
CO2: 22 mmol/L (ref 20–29)
Calcium: 9.7 mg/dL (ref 8.7–10.2)
Chloride: 103 mmol/L (ref 96–106)
Creatinine, Ser: 0.69 mg/dL (ref 0.57–1.00)
Globulin, Total: 3 g/dL (ref 1.5–4.5)
Glucose: 76 mg/dL (ref 70–99)
Potassium: 4.6 mmol/L (ref 3.5–5.2)
Sodium: 140 mmol/L (ref 134–144)
Total Protein: 7.3 g/dL (ref 6.0–8.5)
eGFR: 100 mL/min/{1.73_m2} (ref >59)

## 2023-07-20 LAB — LIPID PANEL WITH LDL/HDL RATIO
Cholesterol, Total: 212 mg/dL — ABNORMAL HIGH (ref 100–199)
HDL: 53 mg/dL (ref >39)
LDL Chol Calc (NIH): 143 mg/dL — ABNORMAL HIGH (ref 0–99)
LDL/HDL Ratio: 2.7 ratio (ref 0.0–3.2)
Triglycerides: 92 mg/dL (ref 0–149)
VLDL Cholesterol Cal: 16 mg/dL (ref 5–40)

## 2023-07-20 LAB — HEMOGLOBIN A1C
Est. average glucose Bld gHb Est-mCnc: 100 mg/dL
Hgb A1c MFr Bld: 5.1 % (ref 4.8–5.6)

## 2023-07-20 LAB — INSULIN, RANDOM: INSULIN: 11.6 u[IU]/mL (ref 2.6–24.9)

## 2023-07-20 LAB — VITAMIN D 25 HYDROXY (VIT D DEFICIENCY, FRACTURES): Vit D, 25-Hydroxy: 67.9 ng/mL (ref 30.0–100.0)

## 2023-07-20 LAB — VITAMIN B12: Vitamin B-12: 602 pg/mL (ref 232–1245)

## 2023-07-23 ENCOUNTER — Encounter: Payer: Self-pay | Admitting: Pharmacist

## 2023-07-23 ENCOUNTER — Other Ambulatory Visit: Payer: Self-pay

## 2023-07-23 ENCOUNTER — Other Ambulatory Visit (HOSPITAL_BASED_OUTPATIENT_CLINIC_OR_DEPARTMENT_OTHER): Payer: Self-pay

## 2023-07-23 ENCOUNTER — Other Ambulatory Visit (HOSPITAL_COMMUNITY): Payer: Self-pay

## 2023-07-24 ENCOUNTER — Other Ambulatory Visit (HOSPITAL_BASED_OUTPATIENT_CLINIC_OR_DEPARTMENT_OTHER): Payer: Self-pay

## 2023-07-24 ENCOUNTER — Ambulatory Visit (INDEPENDENT_AMBULATORY_CARE_PROVIDER_SITE_OTHER): Payer: Managed Care, Other (non HMO) | Admitting: Family Medicine

## 2023-07-24 NOTE — Progress Notes (Signed)
Chief Complaint:   OBESITY Laura [name] is here to discuss her progress with her obesity treatment plan along with follow-up of her obesity related diagnoses. Laura [name] is on the Category 2 Plan and states she is following her eating plan approximately 90% of the time. Laura [name] states she is playing pickle ball for 60 minutes 3 times per week.  Today's visit was #: 30 Starting weight: 168 lbs Starting date: 01/20/2021 Today's weight: 150 lbs Today's date: 07/19/2023 Total lbs lost to date: 18 Total lbs lost since last in-office visit: 1  Interim History: Patient continues to do well with her weight loss.  Her hunger is controlled and she is doing well with meal planning.  She is enjoying playing pickle ball.  Subjective:   1. Vitamin D deficiency Patient is on vitamin D, and she is at high risk of over replacement.  2. Other hyperlipidemia Patient is on Crestor, and she is doing well on her diet.  3. Insulin resistance Patient is doing well on her diet, and she has no signs of hypoglycemia.  Assessment/Plan:   1. Vitamin D deficiency We will check labs today, and we will refill prescription vitamin D for 90 days.  - Vitamin D, Ergocalciferol, (DRISDOL) 1.25 MG (50000 UNIT) CAPS capsule; Take 1 capsule (50,000 Units total) by mouth every 14 (fourteen) days.  Dispense: 8 capsule; Refill: 0 - VITAMIN D 25 Hydroxy (Vit-D Deficiency, Fractures)  2. Other hyperlipidemia We will check labs today, and we will refill Crestor 5 mg once daily for 90 days.  - rosuvastatin (CRESTOR) 5 MG tablet; Take 1 tablet (5 mg total) by mouth daily.  Dispense: 90 tablet; Refill: 0 - Lipid Panel With LDL/HDL Ratio  3. Insulin resistance We will check labs today.  Patient will continue with her diet and exercise.  - CMP14+EGFR - Vitamin B12 - Insulin, random - Hemoglobin A1c  4. BMI 27.0-27.9,adult  5. Obesity, Beginning BMI 30.73 Laura [name] is currently in the action stage of change. As such, her  goal is to continue with weight loss efforts. She has agreed to the Category 2 Plan.   Exercise goals: As is.   Behavioral modification strategies: increasing lean protein intake and increasing water intake.  Laura [name] has agreed to follow-up with our clinic in 4 weeks. She was informed of the importance of frequent follow-up visits to maximize her success with intensive lifestyle modifications for her multiple health conditions.   Laura [name] was informed we would discuss her lab results at her next visit unless there is a critical issue that needs to be addressed sooner. Laura [name] agreed to keep her next visit at the agreed upon time to discuss these results.  Objective:   Blood pressure 96/60, pulse 76, temperature 98.1 F (36.7 C), height 5\' 2"  (1.575 m), weight 150 lb (68 kg), SpO2 98%. Body mass index is 27.44 kg/m.  Lab Results  Component Value Date   CREATININE 0.69 07/19/2023   BUN 15 07/19/2023   NA 140 07/19/2023   K 4.6 07/19/2023   CL 103 07/19/2023   CO2 22 07/19/2023   Lab Results  Component Value Date   ALT 19 07/19/2023   AST 14 07/19/2023   ALKPHOS 61 07/19/2023   BILITOT 0.3 07/19/2023   Lab Results  Component Value Date   HGBA1C 5.1 07/19/2023   HGBA1C 5.4 01/08/2023   HGBA1C 5.4 03/14/2022   HGBA1C 5.3 09/27/2021   HGBA1C 5.4 05/09/2021   Lab Results  Component Value Date  INSULIN 11.6 07/19/2023   INSULIN 8.6 01/08/2023   INSULIN 5.3 03/14/2022   INSULIN 6.0 09/27/2021   INSULIN 5.8 05/09/2021   Lab Results  Component Value Date   TSH 1.560 01/08/2023   Lab Results  Component Value Date   CHOL 212 (H) 07/19/2023   HDL 53 07/19/2023   LDLCALC 143 (H) 07/19/2023   TRIG 92 07/19/2023   CHOLHDL 3.3 11/07/2017   Lab Results  Component Value Date   VD25OH 67.9 07/19/2023   VD25OH 73.5 01/08/2023   VD25OH 30.6 03/14/2022   Lab Results  Component Value Date   WBC 4.4 01/08/2023   HGB 13.2 01/08/2023   HCT 39.5 01/08/2023   MCV 90  01/08/2023   PLT 304 01/08/2023   No results found for: "IRON", "TIBC", "FERRITIN"  Attestation Statements:   Reviewed by clinician on day of visit: allergies, medications, problem list, medical history, surgical history, family history, social history, and previous encounter notes.   I, Burt Knack, am acting as transcriptionist for Quillian Quince, MD.  I have reviewed the above documentation for accuracy and completeness, and I agree with the above. -  Quillian Quince, MD

## 2023-07-25 ENCOUNTER — Encounter (INDEPENDENT_AMBULATORY_CARE_PROVIDER_SITE_OTHER): Payer: Self-pay | Admitting: Family Medicine

## 2023-07-25 ENCOUNTER — Other Ambulatory Visit (HOSPITAL_COMMUNITY): Payer: Self-pay

## 2023-07-25 ENCOUNTER — Other Ambulatory Visit: Payer: Self-pay

## 2023-07-25 ENCOUNTER — Telehealth (INDEPENDENT_AMBULATORY_CARE_PROVIDER_SITE_OTHER): Payer: Managed Care, Other (non HMO) | Admitting: Family Medicine

## 2023-07-25 DIAGNOSIS — E785 Hyperlipidemia, unspecified: Secondary | ICD-10-CM | POA: Diagnosis not present

## 2023-07-25 DIAGNOSIS — Z6827 Body mass index (BMI) 27.0-27.9, adult: Secondary | ICD-10-CM

## 2023-07-25 DIAGNOSIS — E669 Obesity, unspecified: Secondary | ICD-10-CM | POA: Diagnosis not present

## 2023-07-25 DIAGNOSIS — E7849 Other hyperlipidemia: Secondary | ICD-10-CM

## 2023-07-25 DIAGNOSIS — E559 Vitamin D deficiency, unspecified: Secondary | ICD-10-CM

## 2023-07-25 MED ORDER — ROSUVASTATIN CALCIUM 10 MG PO TABS
10.0000 mg | ORAL_TABLET | Freq: Every day | ORAL | 0 refills | Status: DC
Start: 2023-07-25 — End: 2023-11-14
  Filled 2023-07-25: qty 90, 90d supply, fill #0

## 2023-07-25 NOTE — Progress Notes (Signed)
TeleHealth Visit:  This visit was completed with telemedicine (audio/video) technology. Laura [name] has verbally consented to this TeleHealth visit. The patient is located at home, the provider is located at home. The participants in this visit include the listed provider and patient. The visit was conducted today via MyChart video.  OBESITY Laura [name] is here to discuss her progress with her obesity treatment plan along with follow-up of her obesity related diagnoses.   Today's visit was # 31 Starting weight: 168 lbs Starting date: 01/20/21 Weight at last in office visit: 150 lbs on 07/19/23 Total weight loss: 18 lbs at last in office visit on 07/19/23. Today's reported weight (07/25/23): none reported  Nutrition Plan: the Category 2 plan   Current exercise: pickle ball for 60 minutes 3 times per week  Interim History:  Breakfast is [place] bacon and coffee.  She is avoiding eggs to hopefully improve her cholesterol. Usually has chicken and vegetable for lunch. Has protein shake for dinner. She is nearly at her goal weight of < 150 lbs. She has been off Zepbound due to lack of insurance coverage since April and denies polyphagia.  Eating all of the prescribed protein: no Skipping meals: No Drinking adequate water: Yes Drinking sugar sweetened beverages: No Hunger controlled: well controlled. Cravings controlled:  well controlled.  Assessment/Plan:  We discussed recent lab results in depth.  1. Hyperlipidemia LDL is not at goal but is improved.  LDL is now 143, down from 409 on 01/18/2023.  Triglycerides and HDL are normal. We discussed that genetic factors likely playing into her high LDL. Medication(s): Crestor 5 mg daily. Tolerating well. No myalgias. 10-year ASCVD risk score is 2.2%. Lab Results  Component Value Date   CHOL 212 (H) 07/19/2023   HDL 53 07/19/2023   LDLCALC 143 (H) 07/19/2023   TRIG 92 07/19/2023   CHOLHDL 3.3 11/07/2017   CHOLHDL 3.6 10/16/2017    Lab Results  Component Value Date   ALT 19 07/19/2023   AST 14 07/19/2023   ALKPHOS 61 07/19/2023   BILITOT 0.3 07/19/2023   The 10-year ASCVD risk score (Arnett DK, et al., 2019) is: 2.2%   Values used to calculate the score:     Age: 57 years     Sex: Female     Is Non-Hispanic African American: Yes     Diabetic: No     Tobacco smoker: No     Systolic Blood Pressure: 96 mmHg     Is BP treated: No     HDL Cholesterol: 53 mg/dL     Total Cholesterol: 212 mg/dL  Plan: Increase rosuvastatin to 10 mg daily.  Increase fiber to 25 gms per day. Limit red meat. No trans fats. Limit saturated fat.   2. Vitamin D Deficiency Vitamin D is at goal of 50.  Most recent vitamin D level was 67.9 on 07/19/2023.  She has maintained her level fairly well on 50,000 IU vitamin D every 14 days.. She is on  prescription ergocalciferol 50,000 IU every 14 days. Lab Results  Component Value Date   VD25OH 67.9 07/19/2023   VD25OH 73.5 01/08/2023   VD25OH 30.6 03/14/2022    Plan: Continue  prescription ergocalciferol 50,000 IU every 14 days   3. Generalized Obesity: Current BMI 27  Laura [name] is currently in the action stage of change. As such, her goal is to continue with weight loss efforts.  She has agreed to the Category 2 plan and Other 85 g of protein daily .  1.  Advised her to increase protein.  Recommended that she keep track by journaling using an app.   Exercise goals:  as is  Behavioral modification strategies: increasing lean protein intake, meal planning , and increasing fiber rich foods.  Laura [name] has agreed to follow-up with our clinic in 4 weeks.  No orders of the defined types were placed in this encounter.   Medications Discontinued During This Encounter  Medication Reason   rosuvastatin (CRESTOR) 5 MG tablet    tirzepatide (ZEPBOUND) 5 MG/0.5ML Pen Cost of medication     Meds ordered this encounter  Medications   rosuvastatin (CRESTOR) 10 MG tablet    Sig: Take  1 tablet (10 mg total) by mouth daily.    Dispense:  90 tablet    Refill:  0    Pt prefers Rx be mailed.    Order Specific Question:   Supervising Provider    Answer:   Glennis Brink [[id number]]      Objective:   VITALS: Per patient if applicable, see vitals. GENERAL: Alert and in no acute distress. CARDIOPULMONARY: No increased WOB. Speaking in clear sentences.  PSYCH: Pleasant and cooperative. Speech normal rate and rhythm. Affect is appropriate. Insight and judgement are appropriate. Attention is focused, linear, and appropriate.  NEURO: Oriented as arrived to appointment on time with no prompting.   Attestation Statements:   Reviewed by clinician on day of visit: allergies, medications, problem list, medical history, surgical history, family history, social history, and previous encounter notes.   This was prepared with the assistance of Engineer, civil (consulting).  Occasional wrong-word or sound-a-like substitutions may have occurred due to the inherent limitations of voice recognition software.

## 2023-08-16 ENCOUNTER — Other Ambulatory Visit (HOSPITAL_COMMUNITY): Payer: Self-pay

## 2023-08-23 ENCOUNTER — Encounter (INDEPENDENT_AMBULATORY_CARE_PROVIDER_SITE_OTHER): Payer: Self-pay | Admitting: Family Medicine

## 2023-08-23 ENCOUNTER — Ambulatory Visit (INDEPENDENT_AMBULATORY_CARE_PROVIDER_SITE_OTHER): Payer: Managed Care, Other (non HMO) | Admitting: Family Medicine

## 2023-08-23 VITALS — BP 106/67 | HR 81 | Temp 98.0°F | Ht 62.0 in | Wt 148.0 lb

## 2023-08-23 DIAGNOSIS — E669 Obesity, unspecified: Secondary | ICD-10-CM

## 2023-08-23 DIAGNOSIS — J302 Other seasonal allergic rhinitis: Secondary | ICD-10-CM | POA: Diagnosis not present

## 2023-08-23 DIAGNOSIS — E7849 Other hyperlipidemia: Secondary | ICD-10-CM

## 2023-08-23 DIAGNOSIS — E559 Vitamin D deficiency, unspecified: Secondary | ICD-10-CM

## 2023-08-23 DIAGNOSIS — E785 Hyperlipidemia, unspecified: Secondary | ICD-10-CM | POA: Diagnosis not present

## 2023-08-23 DIAGNOSIS — Z6827 Body mass index (BMI) 27.0-27.9, adult: Secondary | ICD-10-CM

## 2023-08-23 DIAGNOSIS — E88819 Insulin resistance, unspecified: Secondary | ICD-10-CM

## 2023-08-23 NOTE — Progress Notes (Signed)
.smr  Office: [phone]  /  Fax: [phone]  WEIGHT SUMMARY AND BIOMETRICS  Anthropometric Measurements Height: 5\' 2"  (1.575 m) Weight: 148 lb (67.1 kg) BMI (Calculated): 27.06 Weight at Last Visit: 150 lb Weight Lost Since Last Visit: 2 lb Weight Gained Since Last Visit: 0 Starting Weight: 168 lb Total Weight Loss (lbs): 20 lb (9.072 kg)   Body Composition  Body Fat %: 33.7 % Fat Mass (lbs): 50.2 lbs Muscle Mass (lbs): 93.6 lbs Total Body Water (lbs): 62.4 lbs Visceral Fat Rating : 8   Other Clinical Data Fasting: No Labs: No Today's Visit #: 31 Starting Date: 01/20/21    Chief Complaint: OBESITY     History of Present Illness   The patient, diagnosed with obesity, vitamin D deficiency, and hyperlipidemia, presents for a follow-up visit. She has been adhering to a category 2 diet plan approximately 80% of the time and has lost two pounds in the past month. She has been engaging in regular physical activity, playing pickleball three times a week for about two hours each session.   The patient also reports a seasonal pattern of allergies, typically peaking in September, and associated vertigo. The vertigo has been more noticeable over the past month, but she has not experienced it for over a year prior to this. She has been managing these symptoms with germinate or Tylenol as needed, and exercises provided by their daughter.  The patient is currently on Crestor for hyperlipidemia, and her LDL levels have improved significantly, dropping by 50 points since February. She is also on prescription vitamin D, taken every other week, for her vitamin D deficiency. Her vitamin D levels are now within the target range. Her A1c and insulin levels have also improved, with the A1c at 5.1, the best it has been.          PHYSICAL EXAM:  Blood pressure 106/67, pulse 81, temperature 98 F (36.7 C), height 5\' 2"  (1.575 m), weight 148 lb (67.1 kg), SpO2 97%. Body mass index is  27.07 kg/m.  DIAGNOSTIC DATA REVIEWED:  BMET    Component Value Date/Time   NA 140 07/19/2023 0841   K 4.6 07/19/2023 0841   CL 103 07/19/2023 0841   CO2 22 07/19/2023 0841   GLUCOSE 76 07/19/2023 0841   BUN 15 07/19/2023 0841   CREATININE 0.69 07/19/2023 0841   CALCIUM 9.7 07/19/2023 0841   GFRNONAA 97 01/20/2021 1036   GFRAA 112 01/20/2021 1036   Lab Results  Component Value Date   HGBA1C 5.1 07/19/2023   HGBA1C 5.2 01/20/2021   Lab Results  Component Value Date   INSULIN 11.6 07/19/2023   INSULIN 11.9 01/20/2021   Lab Results  Component Value Date   TSH 1.560 01/08/2023   CBC    Component Value Date/Time   WBC 4.4 01/08/2023 0923   WBC 5.8 07/15/2019 0938   RBC 4.37 01/08/2023 0923   RBC 4.14 07/15/2019 0938   HGB 13.2 01/08/2023 0923   HCT 39.5 01/08/2023 0923   PLT 304 01/08/2023 0923   MCV 90 01/08/2023 0923   MCH 30.2 01/08/2023 0923   MCH 29.7 07/15/2019 0938   MCHC 33.4 01/08/2023 0923   MCHC 32.5 07/15/2019 0938   RDW 11.6 (L) 01/08/2023 0923   Iron Studies No results found for: "IRON", "TIBC", "FERRITIN", "IRONPCTSAT" Lipid Panel     Component Value Date/Time   CHOL 212 (H) 07/19/2023 0841   TRIG 92 07/19/2023 0841   HDL 53 07/19/2023 0841  CHOLHDL 3.3 11/07/2017 0832   LDLCALC 143 (H) 07/19/2023 0841   LDLCALC 164 (H) 11/07/2017 0832   Hepatic Function Panel     Component Value Date/Time   PROT 7.3 07/19/2023 0841   ALBUMIN 4.3 07/19/2023 0841   AST 14 07/19/2023 0841   ALT 19 07/19/2023 0841   ALKPHOS 61 07/19/2023 0841   BILITOT 0.3 07/19/2023 0841      Component Value Date/Time   TSH 1.560 01/08/2023 0923   Nutritional Lab Results  Component Value Date   VD25OH 67.9 07/19/2023   VD25OH 73.5 01/08/2023   VD25OH 30.6 03/14/2022     Assessment and Plan    Obesity Patient has lost 2 pounds in the last month and is adhering to her category 2 plan approximately 80% of the time. She is also engaging in regular exercise  through pickleball three times per week. -Continue current diet and exercise regimen. -Schedule follow-up appointment in 4 weeks.  Hyperlipidemia LDL has decreased from 202 to 143 since February. Patient's Crestor dose was increased from 5mg  to 10mg  in August. -Continue Crestor 10mg  daily. -Recheck lipid panel before the holidays to assess response to increased Crestor dose.  Vitamin D deficiency Patient is on prescription vitamin D 50,000 international units every two weeks. Current level is 67, which is within the target range. -Continue current vitamin D regimen.  Seasonal allergies Patient reports vertigo, which may be related to increased sinus fluid due to high ragweed levels. She is currently using Flonase once daily. -Increase Flonase to two sprays in each nostril twice daily. -Consider adding Zyrtec D if symptoms persist.  Insulin resistance A1c is 5.1 and insulin levels are improving. -Continue current management plan.       She was informed of the importance of frequent follow up visits to maximize her success with intensive lifestyle modifications for her multiple health conditions.    Quillian Quince, MD

## 2023-08-28 ENCOUNTER — Other Ambulatory Visit: Payer: Self-pay | Admitting: Obstetrics and Gynecology

## 2023-08-28 DIAGNOSIS — Z1231 Encounter for screening mammogram for malignant neoplasm of breast: Secondary | ICD-10-CM

## 2023-09-06 ENCOUNTER — Ambulatory Visit
Admission: RE | Admit: 2023-09-06 | Discharge: 2023-09-06 | Disposition: A | Discharge status: Home / Self Care | Payer: Managed Care, Other (non HMO) | Source: Ambulatory Visit | Attending: Obstetrics and Gynecology | Admitting: Obstetrics and Gynecology

## 2023-09-06 DIAGNOSIS — Z1231 Encounter for screening mammogram for malignant neoplasm of breast: Secondary | ICD-10-CM

## 2023-09-19 ENCOUNTER — Ambulatory Visit (INDEPENDENT_AMBULATORY_CARE_PROVIDER_SITE_OTHER): Payer: Managed Care, Other (non HMO) | Admitting: Family Medicine

## 2023-10-17 ENCOUNTER — Ambulatory Visit (INDEPENDENT_AMBULATORY_CARE_PROVIDER_SITE_OTHER): Payer: Managed Care, Other (non HMO) | Admitting: Family Medicine

## 2023-10-17 ENCOUNTER — Other Ambulatory Visit (HOSPITAL_BASED_OUTPATIENT_CLINIC_OR_DEPARTMENT_OTHER): Payer: Self-pay

## 2023-10-17 ENCOUNTER — Encounter (INDEPENDENT_AMBULATORY_CARE_PROVIDER_SITE_OTHER): Payer: Self-pay | Admitting: Family Medicine

## 2023-10-17 VITALS — BP 109/67 | HR 91 | Temp 98.1°F | Ht 62.0 in | Wt 150.0 lb

## 2023-10-17 DIAGNOSIS — E88819 Insulin resistance, unspecified: Secondary | ICD-10-CM

## 2023-10-17 DIAGNOSIS — Z6827 Body mass index (BMI) 27.0-27.9, adult: Secondary | ICD-10-CM

## 2023-10-17 DIAGNOSIS — E785 Hyperlipidemia, unspecified: Secondary | ICD-10-CM | POA: Diagnosis not present

## 2023-10-17 DIAGNOSIS — E7849 Other hyperlipidemia: Secondary | ICD-10-CM

## 2023-10-17 DIAGNOSIS — E669 Obesity, unspecified: Secondary | ICD-10-CM | POA: Diagnosis not present

## 2023-10-17 DIAGNOSIS — E559 Vitamin D deficiency, unspecified: Secondary | ICD-10-CM | POA: Diagnosis not present

## 2023-10-17 MED ORDER — VITAMIN D (ERGOCALCIFEROL) 1.25 MG (50000 UNIT) PO CAPS
50000.0000 [IU] | ORAL_CAPSULE | ORAL | 0 refills | Status: DC
Start: 2023-10-17 — End: 2024-02-12
  Filled 2023-10-17: qty 2, 28d supply, fill #0
  Filled 2023-10-30: qty 6, 84d supply, fill #0
  Filled 2023-10-30: qty 8, 84d supply, fill #0
  Filled 2024-01-21: qty 2, 28d supply, fill #1

## 2023-10-17 NOTE — Progress Notes (Signed)
.smr  Office: [phone]  /  Fax: [phone]  WEIGHT SUMMARY AND BIOMETRICS  Anthropometric Measurements Height: 5\' 2"  (1.575 m) Weight: 150 lb (68 kg) BMI (Calculated): 27.43 Weight at Last Visit: 148 lb Weight Gained Since Last Visit: 2 lb Starting Weight: 168 lb Total Weight Loss (lbs): 18 lb (8.165 kg)   Body Composition  Body Fat %: 34.5 % Fat Mass (lbs): 51.8 lbs Muscle Mass (lbs): 93.2 lbs Total Body Water (lbs): 63.2 lbs Visceral Fat Rating : 8   Other Clinical Data Fasting: No Labs: Yes Today's Visit #: 32 Starting Date: 01/20/21    Chief Complaint: OBESITY   History of Present Illness   The patient, with a history of insulin resistance managed through diet, exercise, and weight loss, presents for a follow-up visit. She reports adherence to a category two eating plan approximately 90% of the time and regular physical activity through playing pickleball three times a week for two hours each session. Despite these efforts, she has gained two pounds in the last two months.  The patient denies any fluid retention or puffiness, although a recent weigh-in suggests a possible slight increase in water weight. She continues to consume protein shakes as recommended and denies skipping meals.  The patient anticipates potential challenges with alcohol consumption rather than food during the upcoming holiday season, which she describes as a typically depressing time for her. She plans to increase her pickleball activity to help manage this.  Regarding vitamin supplementation, the patient is currently taking Crestor for cholesterol and has been maintaining her vitamin D levels. She inquires about the need for additional vitamins, but reports a varied diet that includes vegetables and protein.  The patient plans to have labs done in the next two weeks, including checks for cholesterol, vitamin D, B12, insulin, and A1c levels. She expresses a preference for a walk-in lab  visit.          PHYSICAL EXAM:  Blood pressure 109/67, pulse 91, temperature 98.1 F (36.7 C), height 5\' 2"  (1.575 m), weight 150 lb (68 kg). Body mass index is 27.44 kg/m.  DIAGNOSTIC DATA REVIEWED:  BMET    Component Value Date/Time   NA 140 07/19/2023 0841   K 4.6 07/19/2023 0841   CL 103 07/19/2023 0841   CO2 22 07/19/2023 0841   GLUCOSE 76 07/19/2023 0841   BUN 15 07/19/2023 0841   CREATININE 0.69 07/19/2023 0841   CALCIUM 9.7 07/19/2023 0841   GFRNONAA 97 01/20/2021 1036   GFRAA 112 01/20/2021 1036   Lab Results  Component Value Date   HGBA1C 5.1 07/19/2023   HGBA1C 5.2 01/20/2021   Lab Results  Component Value Date   INSULIN 11.6 07/19/2023   INSULIN 11.9 01/20/2021   Lab Results  Component Value Date   TSH 1.560 01/08/2023   CBC    Component Value Date/Time   WBC 4.4 01/08/2023 0923   WBC 5.8 07/15/2019 0938   RBC 4.37 01/08/2023 0923   RBC 4.14 07/15/2019 0938   HGB 13.2 01/08/2023 0923   HCT 39.5 01/08/2023 0923   PLT 304 01/08/2023 0923   MCV 90 01/08/2023 0923   MCH 30.2 01/08/2023 0923   MCH 29.7 07/15/2019 0938   MCHC 33.4 01/08/2023 0923   MCHC 32.5 07/15/2019 0938   RDW 11.6 (L) 01/08/2023 0923   Iron Studies No results found for: "IRON", "TIBC", "FERRITIN", "IRONPCTSAT" Lipid Panel     Component Value Date/Time   CHOL 212 (H) 07/19/2023 0841   TRIG  92 07/19/2023 0841   HDL 53 07/19/2023 0841   CHOLHDL 3.3 11/07/2017 0832   LDLCALC 143 (H) 07/19/2023 0841   LDLCALC 164 (H) 11/07/2017 0832   Hepatic Function Panel     Component Value Date/Time   PROT 7.3 07/19/2023 0841   ALBUMIN 4.3 07/19/2023 0841   AST 14 07/19/2023 0841   ALT 19 07/19/2023 0841   ALKPHOS 61 07/19/2023 0841   BILITOT 0.3 07/19/2023 0841      Component Value Date/Time   TSH 1.560 01/08/2023 0923   Nutritional Lab Results  Component Value Date   VD25OH 67.9 07/19/2023   VD25OH 73.5 01/08/2023   VD25OH 30.6 03/14/2022     Assessment and  Plan    Obesity Obesity with recent weight gain of two pounds over the last two months. Adheres to category two eating plan 90% of the time and exercises by playing pickleball for two hours three times per week. No signs of fluid retention noted, but slight weight gain could be due to water retention. Discussed potential challenges during the holiday season and strategies to manage her. - Continue category two eating plan - Continue playing pickleball for exercise - Encourage hydration to manage potential fluid retention - Implement strategies to manage holiday season challenges  Insulin Resistance Insulin resistance managed with diet, exercise, and weight loss. Last insulin level was slightly elevated at 11 (normal <5), but A1c was well-controlled at 5.1. - Check insulin and A1c levels in upcoming labs  Hyperlipidemia Hyperlipidemia managed with Crestor. No recent lab results discussed, but continuing medication. - Check cholesterol levels in upcoming labs - Continue Crestor  Vitamin D Deficiency Vitamin D deficiency managed with supplementation. Recent labs showed good levels, but there is a risk of levels going too high, particularly in the summer. Current season reduces this risk. - Refill vitamin D prescription - Check vitamin D levels in upcoming labs  General Health Maintenance General health maintenance discussed, including the importance of a balanced diet and regular exercise. No additional vitamins needed as current levels are adequate. - Encourage a balanced diet with a variety of vegetables and protein sources - No additional vitamin supplementation needed  Follow-up - Order labs for walk-in within the next two weeks - Schedule follow-up appointment before Christmas to review lab results.      She was informed of the importance of frequent follow up visits to maximize her success with intensive lifestyle modifications for her multiple health conditions.    Quillian Quince, MD

## 2023-10-29 ENCOUNTER — Other Ambulatory Visit (HOSPITAL_BASED_OUTPATIENT_CLINIC_OR_DEPARTMENT_OTHER): Payer: Self-pay

## 2023-10-30 ENCOUNTER — Other Ambulatory Visit (HOSPITAL_BASED_OUTPATIENT_CLINIC_OR_DEPARTMENT_OTHER): Payer: Self-pay

## 2023-10-30 ENCOUNTER — Other Ambulatory Visit: Payer: Self-pay

## 2023-11-10 ENCOUNTER — Other Ambulatory Visit (INDEPENDENT_AMBULATORY_CARE_PROVIDER_SITE_OTHER): Payer: Self-pay

## 2023-11-13 ENCOUNTER — Other Ambulatory Visit (HOSPITAL_COMMUNITY): Payer: Self-pay

## 2023-11-13 ENCOUNTER — Other Ambulatory Visit (INDEPENDENT_AMBULATORY_CARE_PROVIDER_SITE_OTHER): Payer: Self-pay

## 2023-11-14 ENCOUNTER — Other Ambulatory Visit (HOSPITAL_BASED_OUTPATIENT_CLINIC_OR_DEPARTMENT_OTHER): Payer: Self-pay

## 2023-11-14 MED ORDER — ROSUVASTATIN CALCIUM 10 MG PO TABS
10.0000 mg | ORAL_TABLET | Freq: Every day | ORAL | 3 refills | Status: DC
  Filled 2023-11-14: qty 90, 90d supply, fill #0

## 2023-11-15 ENCOUNTER — Other Ambulatory Visit (HOSPITAL_BASED_OUTPATIENT_CLINIC_OR_DEPARTMENT_OTHER): Payer: Self-pay

## 2023-11-26 ENCOUNTER — Ambulatory Visit (INDEPENDENT_AMBULATORY_CARE_PROVIDER_SITE_OTHER): Payer: Managed Care, Other (non HMO) | Admitting: Family Medicine

## 2024-01-02 ENCOUNTER — Encounter (INDEPENDENT_AMBULATORY_CARE_PROVIDER_SITE_OTHER): Payer: Self-pay | Admitting: Family Medicine

## 2024-01-02 ENCOUNTER — Ambulatory Visit (INDEPENDENT_AMBULATORY_CARE_PROVIDER_SITE_OTHER): Payer: Managed Care, Other (non HMO) | Admitting: Family Medicine

## 2024-01-02 VITALS — BP 108/72 | HR 87 | Temp 98.1°F | Ht 62.0 in | Wt 155.0 lb

## 2024-01-02 DIAGNOSIS — E7849 Other hyperlipidemia: Secondary | ICD-10-CM

## 2024-01-02 DIAGNOSIS — Z6828 Body mass index (BMI) 28.0-28.9, adult: Secondary | ICD-10-CM

## 2024-01-02 DIAGNOSIS — E6689 Other obesity not elsewhere classified: Secondary | ICD-10-CM

## 2024-01-02 DIAGNOSIS — E785 Hyperlipidemia, unspecified: Secondary | ICD-10-CM | POA: Diagnosis not present

## 2024-01-02 DIAGNOSIS — E559 Vitamin D deficiency, unspecified: Secondary | ICD-10-CM | POA: Diagnosis not present

## 2024-01-02 DIAGNOSIS — E669 Obesity, unspecified: Secondary | ICD-10-CM

## 2024-01-02 NOTE — Progress Notes (Signed)
.smr  Office: [phone]  /  Fax: [phone]  WEIGHT SUMMARY AND BIOMETRICS  Anthropometric Measurements Height: 5\' 2"  (1.575 m) Weight: 155 lb (70.3 kg) BMI (Calculated): 28.34 Weight at Last Visit: 150 lb Weight Lost Since Last Visit: 0 Weight Gained Since Last Visit: 5 lb Starting Weight: 168 lb Total Weight Loss (lbs): 13 lb (5.897 kg)   Body Composition  Body Fat %: 36 % Fat Mass (lbs): 56 lbs Muscle Mass (lbs): 94.4 lbs Total Body Water (lbs): 65.2 lbs Visceral Fat Rating : 9   Other Clinical Data Fasting: Yes Labs: No Today's Visit #: 74 Starting Date: 01/20/21    Chief Complaint: OBESITY   History of Present Illness   The patient presents to discuss her obesity.  She has gained five pounds over the past ten weeks, attributing this to the holiday season, including Thanksgiving, Christmas, and New Year's. She follows her category two eating plan 80% of the time and engages in physical activity by playing pickleball three times a week for one to two hours per session. Cold weather has limited her ability to maintain her exercise routine outdoors.  She has a history of hyperlipidemia and is taking Crestor 10 mg. Her last LDL level, checked in August, was elevated at 143. She is working on decreasing her cholesterol through dietary changes and is due for lab work soon.  She is on a vitamin D prescription of 50,000 international units every two weeks for vitamin D deficiency. Her last vitamin D level, checked in August, was at goal at 89.  She feels 'pretty good' overall but notes persistent stress and fatigue upon waking for the past month, which she associates with the winter season.          PHYSICAL EXAM:  Blood pressure 108/72, pulse 87, temperature 98.1 F (36.7 C), height 5\' 2"  (1.575 m), weight 155 lb (70.3 kg), SpO2 100%. Body mass index is 28.35 kg/m.  DIAGNOSTIC DATA REVIEWED:  BMET    Component Value Date/Time   NA 140 07/19/2023 0841    K 4.6 07/19/2023 0841   CL 103 07/19/2023 0841   CO2 22 07/19/2023 0841   GLUCOSE 76 07/19/2023 0841   BUN 15 07/19/2023 0841   CREATININE 0.69 07/19/2023 0841   CALCIUM 9.7 07/19/2023 0841   GFRNONAA 97 01/20/2021 1036   GFRAA 112 01/20/2021 1036   Lab Results  Component Value Date   HGBA1C 5.1 07/19/2023   HGBA1C 5.2 01/20/2021   Lab Results  Component Value Date   INSULIN 11.6 07/19/2023   INSULIN 11.9 01/20/2021   Lab Results  Component Value Date   TSH 1.560 01/08/2023   CBC    Component Value Date/Time   WBC 4.4 01/08/2023 0923   WBC 5.8 07/15/2019 0938   RBC 4.37 01/08/2023 0923   RBC 4.14 07/15/2019 0938   HGB 13.2 01/08/2023 0923   HCT 39.5 01/08/2023 0923   PLT 304 01/08/2023 0923   MCV 90 01/08/2023 0923   MCH 30.2 01/08/2023 0923   MCH 29.7 07/15/2019 0938   MCHC 33.4 01/08/2023 0923   MCHC 32.5 07/15/2019 0938   RDW 11.6 (L) 01/08/2023 0923   Iron Studies No results found for: "IRON", "TIBC", "FERRITIN", "IRONPCTSAT" Lipid Panel     Component Value Date/Time   CHOL 212 (H) 07/19/2023 0841   TRIG 92 07/19/2023 0841   HDL 53 07/19/2023 0841   CHOLHDL 3.3 11/07/2017 0832   LDLCALC 143 (H) 07/19/2023 0841   LDLCALC 164 (H)  11/07/2017 0832   Hepatic Function Panel     Component Value Date/Time   PROT 7.3 07/19/2023 0841   ALBUMIN 4.3 07/19/2023 0841   AST 14 07/19/2023 0841   ALT 19 07/19/2023 0841   ALKPHOS 61 07/19/2023 0841   BILITOT 0.3 07/19/2023 0841      Component Value Date/Time   TSH 1.560 01/08/2023 0923   Nutritional Lab Results  Component Value Date   VD25OH 67.9 07/19/2023   VD25OH 73.5 01/08/2023   VD25OH 30.6 03/14/2022     Assessment and Plan    Obesity in remission Recent weight gain of five pounds over the holiday season. Following a category two eating plan 80% of the time and engaging in physical activity (pickleball) three times a week. Discussed impact of seasonal affective disorder and weather on physical  activity levels. Prefers engaging activities; finds walking videos or chair yoga uninteresting. - Continue category two eating plan - Encourage alternative engaging indoor activities - Follow up in six weeks for weight and lifestyle assessment  Hyperlipidemia Managed with Crestor 10 mg. Last LDL level was elevated at 846 in August. Due for lab recheck. Working on decreasing cholesterol through diet. - Continue Crestor 10 mg - Fasting labs in six weeks to recheck lipid levels  Vitamin D Deficiency Managed with vitamin D 50,000 IU every two weeks. Last vitamin D level in August was at goal (67). - Continue vitamin D 50,000 IU every two weeks  General Health Maintenance Discussed stress management and sleep quality. Reports feeling tired upon waking for the past month, likely related to seasonal affective disorder. - Encourage stress management techniques - Monitor sleep quality and consider further evaluation if symptoms persist  Follow-up - Schedule follow-up visit in six weeks - Plan for fasting labs at next visit.         I have personally spent 35 minutes total time today in preparation, patient care, and documentation for this visit, including the following: review of clinical lab tests; review of medical tests/procedures/services.    She was informed of the importance of frequent follow up visits to maximize her success with intensive lifestyle modifications for her multiple health conditions.    Quillian Quince, MD

## 2024-01-21 ENCOUNTER — Other Ambulatory Visit: Payer: Self-pay

## 2024-01-22 ENCOUNTER — Other Ambulatory Visit (HOSPITAL_BASED_OUTPATIENT_CLINIC_OR_DEPARTMENT_OTHER): Payer: Self-pay

## 2024-01-22 MED ORDER — ESTRADIOL 0.05 MG/24HR TD PTWK
0.0500 mg | MEDICATED_PATCH | TRANSDERMAL | 2 refills | Status: AC
  Filled 2024-01-22: qty 12, 84d supply, fill #0

## 2024-02-12 ENCOUNTER — Ambulatory Visit (INDEPENDENT_AMBULATORY_CARE_PROVIDER_SITE_OTHER): Payer: Managed Care, Other (non HMO) | Admitting: Family Medicine

## 2024-02-12 ENCOUNTER — Encounter (INDEPENDENT_AMBULATORY_CARE_PROVIDER_SITE_OTHER): Payer: Self-pay | Admitting: Family Medicine

## 2024-02-12 ENCOUNTER — Other Ambulatory Visit (HOSPITAL_BASED_OUTPATIENT_CLINIC_OR_DEPARTMENT_OTHER): Payer: Self-pay

## 2024-02-12 VITALS — BP 96/59 | HR 91 | Temp 98.3°F | Ht 62.0 in | Wt 151.0 lb

## 2024-02-12 DIAGNOSIS — E7849 Other hyperlipidemia: Secondary | ICD-10-CM

## 2024-02-12 DIAGNOSIS — E559 Vitamin D deficiency, unspecified: Secondary | ICD-10-CM

## 2024-02-12 DIAGNOSIS — E785 Hyperlipidemia, unspecified: Secondary | ICD-10-CM | POA: Diagnosis not present

## 2024-02-12 DIAGNOSIS — E669 Obesity, unspecified: Secondary | ICD-10-CM | POA: Diagnosis not present

## 2024-02-12 DIAGNOSIS — E88819 Insulin resistance, unspecified: Secondary | ICD-10-CM

## 2024-02-12 DIAGNOSIS — Z6827 Body mass index (BMI) 27.0-27.9, adult: Secondary | ICD-10-CM

## 2024-02-12 MED ORDER — ESTRADIOL 0.05 MG/24HR TD PTWK
0.0500 mg | MEDICATED_PATCH | TRANSDERMAL | 2 refills | Status: AC
Start: 2024-02-12 — End: ?
  Filled 2024-02-12: qty 12, 84d supply, fill #0
  Filled 2024-05-19: qty 12, 84d supply, fill #1
  Filled 2024-08-11: qty 12, 84d supply, fill #2

## 2024-02-12 MED ORDER — VITAMIN D (ERGOCALCIFEROL) 1.25 MG (50000 UNIT) PO CAPS
50000.0000 [IU] | ORAL_CAPSULE | ORAL | 0 refills | Status: DC
  Filled 2024-02-12: qty 2, 28d supply, fill #0
  Filled 2024-03-25: qty 2, 28d supply, fill #1
  Filled 2024-04-06: qty 2, 28d supply, fill #2
  Filled 2024-04-18: qty 2, 28d supply, fill #3

## 2024-02-12 MED ORDER — ROSUVASTATIN CALCIUM 10 MG PO TABS
10.0000 mg | ORAL_TABLET | Freq: Every day | ORAL | 3 refills | Status: AC
  Filled 2024-02-12: qty 90, 90d supply, fill #0
  Filled 2024-05-19: qty 90, 90d supply, fill #1
  Filled 2024-08-27: qty 90, 90d supply, fill #2
  Filled 2024-10-27: qty 90, 90d supply, fill #3

## 2024-02-12 NOTE — Progress Notes (Addendum)
 Office: [phone]  /  Fax: [phone]  WEIGHT SUMMARY AND BIOMETRICS  Anthropometric Measurements Height: 5\' 2"  (1.575 m) Weight: 151 lb (68.5 kg) BMI (Calculated): 27.61 Weight at Last Visit: 155 lb Weight Lost Since Last Visit: 4 lb Weight Gained Since Last Visit: 0 Starting Weight: 168     lb Total Weight Loss (lbs): 17 lb (7.711 kg)   Body Composition  Body Fat %: 35 % Fat Mass (lbs): 53 lbs Muscle Mass (lbs): 93.2 lbs Total Body Water (lbs): 62.8 lbs Visceral Fat Rating : 8   Other Clinical Data Fasting: yes Labs: yes Today's Visit #: 34 Starting Date: 01/20/21    Chief Complaint: OBESITY   History of Present Illness   The patient presents for obesity treatment and progress monitoring.  She is following a category two eating plan and adheres to it about 75% of the time. She engages in physical activity by playing pickleball for two hours, three times a week. Since her last visit, she has lost four pounds, indicating progress in her weight management.  She has a history of hyperlipidemia, which she manages with diet, exercise, and Crestor 10 mg. She is due for lab work to monitor her cholesterol levels.  She has a history of vitamin D deficiency and takes prescription vitamin D 50,000 units every two weeks.  She mentions experiencing a flare-up of allergies recently.          PHYSICAL EXAM:  Blood pressure (!) 96/59, pulse 91, temperature 98.3 F (36.8 C), height 5\' 2"  (1.575 m), weight 151 lb (68.5 kg), SpO2 100%. Body mass index is 27.62 kg/m.  DIAGNOSTIC DATA REVIEWED:  BMET    Component Value Date/Time   NA 140 07/19/2023 0841   K 4.6 07/19/2023 0841   CL 103 07/19/2023 0841   CO2 22 07/19/2023 0841   GLUCOSE 76 07/19/2023 0841   BUN 15 07/19/2023 0841   CREATININE 0.69 07/19/2023 0841   CALCIUM 9.7 07/19/2023 0841   GFRNONAA 97 01/20/2021 1036   GFRAA 112 01/20/2021 1036   Lab Results  Component Value Date   HGBA1C 5.1  07/19/2023   HGBA1C 5.2 01/20/2021   Lab Results  Component Value Date   INSULIN 11.6 07/19/2023   INSULIN 11.9 01/20/2021   Lab Results  Component Value Date   TSH 1.560 01/08/2023   CBC    Component Value Date/Time   WBC 4.4 01/08/2023 0923   WBC 5.8 07/15/2019 0938   RBC 4.37 01/08/2023 0923   RBC 4.14 07/15/2019 0938   HGB 13.2 01/08/2023 0923   HCT 39.5 01/08/2023 0923   PLT 304 01/08/2023 0923   MCV 90 01/08/2023 0923   MCH 30.2 01/08/2023 0923   MCH 29.7 07/15/2019 0938   MCHC 33.4 01/08/2023 0923   MCHC 32.5 07/15/2019 0938   RDW 11.6 (L) 01/08/2023 0923   Iron Studies No results found for: "IRON", "TIBC", "FERRITIN", "IRONPCTSAT" Lipid Panel     Component Value Date/Time   CHOL 212 (H) 07/19/2023 0841   TRIG 92 07/19/2023 0841   HDL 53 07/19/2023 0841   CHOLHDL 3.3 11/07/2017 0832   LDLCALC 143 (H) 07/19/2023 0841   LDLCALC 164 (H) 11/07/2017 0832   Hepatic Function Panel     Component Value Date/Time   PROT 7.3 07/19/2023 0841   ALBUMIN 4.3 07/19/2023 0841   AST 14 07/19/2023 0841   ALT 19 07/19/2023 0841   ALKPHOS 61 07/19/2023 0841   BILITOT 0.3 07/19/2023 0841  Component Value Date/Time   TSH 1.560 01/08/2023 0923   Nutritional Lab Results  Component Value Date   VD25OH 67.9 07/19/2023   VD25OH 73.5 01/08/2023   VD25OH 30.6 03/14/2022     Assessment and Plan    Obesity   She is following a category two eating plan with 75% adherence and engages in physical activity by playing pickleball for two hours, three times a week. She has lost four pounds since her last visit, indicating effective weight management.   - Continue category two eating plan   - Continue playing pickleball for exercise    Insulin resistance   She is managing insulin resistance with diet and exercise. Insulin levels and A1c will be monitored to ensure optimal health.   - Check insulin levels and A1c   - Continue category two eating plan   - Continue playing  pickleball for exercise    Hyperlipidemia   She is managing hyperlipidemia with diet, exercise, and Crestor 10 mg. Cholesterol levels will be monitored to ensure they remain within the desired range.   - Refill Crestor 10 mg   - Check cholesterol levels   - Continue category two eating plan   - Continue playing pickleball for exercise    Vitamin D deficiency   She is on prescription vitamin D 50,000 IU every two weeks. Vitamin D levels will be monitored to ensure they remain within the appropriate range.   - Refill vitamin D 50,000 IU every two weeks   - Check vitamin D levels   FU 6-8 weeks           She was informed of the importance of frequent follow up visits to maximize her success with intensive lifestyle modifications for her multiple health conditions.    Quillian Quince, MD

## 2024-02-13 ENCOUNTER — Other Ambulatory Visit (HOSPITAL_BASED_OUTPATIENT_CLINIC_OR_DEPARTMENT_OTHER): Payer: Self-pay

## 2024-02-13 LAB — LIPID PANEL WITH LDL/HDL RATIO
Cholesterol, Total: 153 mg/dL (ref 100–199)
HDL: 45 mg/dL (ref >39)
LDL Chol Calc (NIH): 94 mg/dL (ref 0–99)
LDL/HDL Ratio: 2.1 ratio (ref 0.0–3.2)
Triglycerides: 74 mg/dL (ref 0–149)
VLDL Cholesterol Cal: 14 mg/dL (ref 5–40)

## 2024-02-13 LAB — HEMOGLOBIN A1C
Est. average glucose Bld gHb Est-mCnc: 111 mg/dL
Hgb A1c MFr Bld: 5.5 % (ref 4.8–5.6)

## 2024-02-13 LAB — CMP14+EGFR
ALT: 36 IU/L — ABNORMAL HIGH (ref 0–32)
AST: 18 IU/L (ref 0–40)
Albumin: 4.4 g/dL (ref 3.8–4.9)
Alkaline Phosphatase: 50 IU/L (ref 44–121)
BUN/Creatinine Ratio: 16 (ref 9–23)
BUN: 13 mg/dL (ref 6–24)
Bilirubin Total: 0.3 mg/dL (ref 0.0–1.2)
CO2: 23 mmol/L (ref 20–29)
Calcium: 10 mg/dL (ref 8.7–10.2)
Chloride: 103 mmol/L (ref 96–106)
Creatinine, Ser: 0.82 mg/dL (ref 0.57–1.00)
Globulin, Total: 2.8 g/dL (ref 1.5–4.5)
Glucose: 85 mg/dL (ref 70–99)
Potassium: 4.8 mmol/L (ref 3.5–5.2)
Sodium: 139 mmol/L (ref 134–144)
Total Protein: 7.2 g/dL (ref 6.0–8.5)
eGFR: 82 mL/min/{1.73_m2} (ref >59)

## 2024-02-13 LAB — VITAMIN D 25 HYDROXY (VIT D DEFICIENCY, FRACTURES): Vit D, 25-Hydroxy: 85.8 ng/mL (ref 30.0–100.0)

## 2024-02-13 LAB — INSULIN, RANDOM: INSULIN: 10.6 u[IU]/mL (ref 2.6–24.9)

## 2024-02-13 MED ORDER — PREDNISONE 5 MG (21) PO TBPK
ORAL_TABLET | ORAL | 0 refills | Status: AC
  Filled 2024-02-13: qty 21, 6d supply, fill #0

## 2024-02-14 ENCOUNTER — Encounter (INDEPENDENT_AMBULATORY_CARE_PROVIDER_SITE_OTHER): Payer: Self-pay | Admitting: Family Medicine

## 2024-02-18 NOTE — Telephone Encounter (Signed)
 Patient would like to be responded to via mychart!

## 2024-03-25 ENCOUNTER — Encounter (INDEPENDENT_AMBULATORY_CARE_PROVIDER_SITE_OTHER): Payer: Self-pay | Admitting: Family Medicine

## 2024-03-25 ENCOUNTER — Other Ambulatory Visit (HOSPITAL_BASED_OUTPATIENT_CLINIC_OR_DEPARTMENT_OTHER): Payer: Self-pay

## 2024-03-25 ENCOUNTER — Ambulatory Visit (INDEPENDENT_AMBULATORY_CARE_PROVIDER_SITE_OTHER): Admitting: Family Medicine

## 2024-03-25 NOTE — Telephone Encounter (Signed)
 Patient canceled her appointment for today but would like to proceed with getting her labs done. Could you please put orders in for the patient?

## 2024-04-07 ENCOUNTER — Other Ambulatory Visit (HOSPITAL_BASED_OUTPATIENT_CLINIC_OR_DEPARTMENT_OTHER): Payer: Self-pay

## 2024-04-19 ENCOUNTER — Other Ambulatory Visit (HOSPITAL_BASED_OUTPATIENT_CLINIC_OR_DEPARTMENT_OTHER): Payer: Self-pay

## 2024-05-06 ENCOUNTER — Encounter (INDEPENDENT_AMBULATORY_CARE_PROVIDER_SITE_OTHER): Payer: Self-pay | Admitting: Family Medicine

## 2024-05-06 ENCOUNTER — Other Ambulatory Visit (HOSPITAL_BASED_OUTPATIENT_CLINIC_OR_DEPARTMENT_OTHER): Payer: Self-pay

## 2024-05-06 ENCOUNTER — Ambulatory Visit (INDEPENDENT_AMBULATORY_CARE_PROVIDER_SITE_OTHER): Admitting: Family Medicine

## 2024-05-06 VITALS — BP 110/72 | HR 82 | Temp 98.0°F | Ht 62.0 in | Wt 155.0 lb

## 2024-05-06 DIAGNOSIS — E785 Hyperlipidemia, unspecified: Secondary | ICD-10-CM | POA: Diagnosis not present

## 2024-05-06 DIAGNOSIS — Z6830 Body mass index (BMI) 30.0-30.9, adult: Secondary | ICD-10-CM

## 2024-05-06 DIAGNOSIS — R7401 Elevation of levels of liver transaminase levels: Secondary | ICD-10-CM | POA: Diagnosis not present

## 2024-05-06 DIAGNOSIS — E559 Vitamin D deficiency, unspecified: Secondary | ICD-10-CM

## 2024-05-06 DIAGNOSIS — E669 Obesity, unspecified: Secondary | ICD-10-CM

## 2024-05-06 DIAGNOSIS — Z6828 Body mass index (BMI) 28.0-28.9, adult: Secondary | ICD-10-CM

## 2024-05-06 DIAGNOSIS — E7849 Other hyperlipidemia: Secondary | ICD-10-CM

## 2024-05-06 MED ORDER — VITAMIN D (ERGOCALCIFEROL) 1.25 MG (50000 UNIT) PO CAPS
50000.0000 [IU] | ORAL_CAPSULE | ORAL | 0 refills | Status: AC
  Filled 2024-05-06: qty 8, 112d supply, fill #0
  Filled 2024-05-19: qty 2, 28d supply, fill #0
  Filled 2024-05-26 (×2): qty 8, 112d supply, fill #0
  Filled 2024-06-02: qty 2, 28d supply, fill #0
  Filled 2024-07-04: qty 2, 28d supply, fill #1
  Filled 2024-08-08: qty 2, 28d supply, fill #2
  Filled 2024-08-27: qty 2, 28d supply, fill #3

## 2024-05-06 NOTE — Progress Notes (Signed)
 Office: [phone]  /  Fax: [phone]  WEIGHT SUMMARY AND BIOMETRICS  Anthropometric Measurements Height: 5\' 2"  (1.575 m) Weight: 155 lb (70.3 kg) BMI (Calculated): 28.34 Weight at Last Visit: 151 lb Weight Lost Since Last Visit: 0 Weight Gained Since Last Visit: 4 lb Starting Weight: 168 lb Total Weight Loss (lbs): 13 lb (5.897 kg) Peak Weight: 168 lb   Body Composition  Body Fat %: 35.4 % Fat Mass (lbs): 55 lbs Muscle Mass (lbs): 95.2 lbs Total Body Water (lbs): 64 lbs Visceral Fat Rating : 9   Other Clinical Data Fasting: no Labs: no Today's Visit #: 35 Starting Date: 01/20/21    Chief Complaint: OBESITY   History of Present Illness Laura [name] is a 58 year old female who presents for a follow-up on her obesity treatment plan.  She adheres to the category two eating plan approximately 70% of the time. Despite this, she has gained four pounds over the last three months. She engages in physical activity by playing pickleball two to three times a week for two hours each session.  She has a history of vitamin D  deficiency and is currently on prescription vitamin D , which she takes every two weeks. Her recent labs showed her vitamin D  levels were in the 80s. She requests a refill for this medication.  She is on Crestor  for hyperlipidemia, which she does not need refilled today. Her cholesterol levels, including LDL, HDL, and triglycerides, have been managed with her current regimen of diet, exercise, and 10 mg of Crestor .  Her ALT level was 36, compared to her usual range of teens to 20s, with a previous high of 61 in 2022. No abdominal pain or jaundice.      PHYSICAL EXAM:  Blood pressure 110/72, pulse 82, temperature 98 F (36.7 C), height 5\' 2"  (1.575 m), weight 155 lb (70.3 kg), SpO2 99%. Body mass index is 28.35 kg/m.  DIAGNOSTIC DATA REVIEWED:  BMET    Component Value Date/Time   NA 139 02/12/2024 0914   K 4.8 02/12/2024 0914   CL  103 02/12/2024 0914   CO2 23 02/12/2024 0914   GLUCOSE 85 02/12/2024 0914   BUN 13 02/12/2024 0914   CREATININE 0.82 02/12/2024 0914   CALCIUM  10.0 02/12/2024 0914   GFRNONAA 97 01/20/2021 1036   GFRAA 112 01/20/2021 1036   Lab Results  Component Value Date   HGBA1C 5.5 02/12/2024   HGBA1C 5.2 01/20/2021   Lab Results  Component Value Date   INSULIN  10.6 02/12/2024   INSULIN  11.9 01/20/2021   Lab Results  Component Value Date   TSH 1.560 01/08/2023   CBC    Component Value Date/Time   WBC 4.4 01/08/2023 0923   WBC 5.8 07/15/2019 0938   RBC 4.37 01/08/2023 0923   RBC 4.14 07/15/2019 0938   HGB 13.2 01/08/2023 0923   HCT 39.5 01/08/2023 0923   PLT 304 01/08/2023 0923   MCV 90 01/08/2023 0923   MCH 30.2 01/08/2023 0923   MCH 29.7 07/15/2019 0938   MCHC 33.4 01/08/2023 0923   MCHC 32.5 07/15/2019 0938   RDW 11.6 (L) 01/08/2023 0923   Iron Studies No results found for: "IRON", "TIBC", "FERRITIN", "IRONPCTSAT" Lipid Panel     Component Value Date/Time   CHOL 153 02/12/2024 0914   TRIG 74 02/12/2024 0914   HDL 45 02/12/2024 0914   CHOLHDL 3.3 11/07/2017 0832   LDLCALC 94 02/12/2024 0914   LDLCALC 164 (H) 11/07/2017 0832   Hepatic  Function Panel     Component Value Date/Time   PROT 7.2 02/12/2024 0914   ALBUMIN 4.4 02/12/2024 0914   AST 18 02/12/2024 0914   ALT 36 (H) 02/12/2024 0914   ALKPHOS 50 02/12/2024 0914   BILITOT 0.3 02/12/2024 0914      Component Value Date/Time   TSH 1.560 01/08/2023 0923   Nutritional Lab Results  Component Value Date   VD25OH 85.8 02/12/2024   VD25OH 67.9 07/19/2023   VD25OH 73.5 01/08/2023     Assessment and Plan Assessment & Plan Obesity Obesity is well-managed with a BMI of 28, below the target of 30. Her body composition is healthy with a fat percentage of 35.4% and a visceral fat rating of 9. Muscle mass is adequate. Despite a 4-pound weight gain in the last three months, her weight is appropriate for her body  composition. Emphasized that BMI is not the sole indicator of health and discussed the importance of muscle mass and fat distribution. - Continue category two eating plan - Continue playing pickleball 2-3 times a week - Monitor weight and adjust diet and exercise as needed  Mildly elevated ALT ALT is slightly elevated at 36, likely due to recent dietary changes and slight weight gain. Not indicative of serious liver conditions. Will recheck ALT in three months to ensure normalization. - Recheck ALT in three months - Continue diet exercise and weight loss to treat  Vitamin D  deficiency Vitamin D  levels have improved but are at risk of being over-replaced, currently in the 80s. Target range is 50-60 to avoid excess vitamin D  depositing in tissues. Discussed risks of excessive vitamin D  levels and the importance of maintaining within target range. - Continue vitamin D  supplementation every two weeks - Recheck vitamin D  levels in three months  Hyperlipidemia Hyperlipidemia is well-controlled with diet, exercise, and 10 mg of Crestor . LDL, HDL, and triglycerides are within target ranges. - Continue Crestor  10 mg daily - Continue current diet and exercise regimen      She was informed of the importance of frequent follow up visits to maximize her success with intensive lifestyle modifications for her multiple health conditions.    Jasmine Mesi, MD

## 2024-05-15 ENCOUNTER — Other Ambulatory Visit (HOSPITAL_BASED_OUTPATIENT_CLINIC_OR_DEPARTMENT_OTHER): Payer: Self-pay

## 2024-05-15 MED ORDER — MELOXICAM 15 MG PO TABS
15.0000 mg | ORAL_TABLET | Freq: Every day | ORAL | 0 refills | Status: AC
  Filled 2024-05-15: qty 10, 10d supply, fill #0

## 2024-05-15 MED ORDER — CYCLOBENZAPRINE HCL 10 MG PO TABS
10.0000 mg | ORAL_TABLET | Freq: Every evening | ORAL | 0 refills | Status: AC | PRN
  Filled 2024-05-15: qty 10, 10d supply, fill #0

## 2024-05-20 ENCOUNTER — Other Ambulatory Visit: Payer: Self-pay

## 2024-05-20 ENCOUNTER — Other Ambulatory Visit (HOSPITAL_BASED_OUTPATIENT_CLINIC_OR_DEPARTMENT_OTHER): Payer: Self-pay

## 2024-05-26 ENCOUNTER — Other Ambulatory Visit (HOSPITAL_BASED_OUTPATIENT_CLINIC_OR_DEPARTMENT_OTHER): Payer: Self-pay

## 2024-06-02 ENCOUNTER — Other Ambulatory Visit (HOSPITAL_BASED_OUTPATIENT_CLINIC_OR_DEPARTMENT_OTHER): Payer: Self-pay

## 2024-07-01 ENCOUNTER — Other Ambulatory Visit (HOSPITAL_BASED_OUTPATIENT_CLINIC_OR_DEPARTMENT_OTHER): Payer: Self-pay

## 2024-07-01 MED ORDER — PREDNISONE 10 MG (21) PO TBPK
ORAL_TABLET | ORAL | 0 refills | Status: AC
  Filled 2024-07-01: qty 21, 6d supply, fill #0

## 2024-07-04 ENCOUNTER — Other Ambulatory Visit (HOSPITAL_BASED_OUTPATIENT_CLINIC_OR_DEPARTMENT_OTHER): Payer: Self-pay

## 2024-08-08 ENCOUNTER — Other Ambulatory Visit (HOSPITAL_BASED_OUTPATIENT_CLINIC_OR_DEPARTMENT_OTHER): Payer: Self-pay

## 2024-08-11 ENCOUNTER — Ambulatory Visit (INDEPENDENT_AMBULATORY_CARE_PROVIDER_SITE_OTHER): Admitting: Family Medicine

## 2024-08-11 ENCOUNTER — Other Ambulatory Visit (HOSPITAL_BASED_OUTPATIENT_CLINIC_OR_DEPARTMENT_OTHER): Payer: Self-pay

## 2024-08-11 ENCOUNTER — Other Ambulatory Visit: Payer: Self-pay

## 2024-08-15 ENCOUNTER — Encounter (INDEPENDENT_AMBULATORY_CARE_PROVIDER_SITE_OTHER): Payer: Self-pay | Admitting: *Deleted

## 2024-08-27 ENCOUNTER — Other Ambulatory Visit (HOSPITAL_BASED_OUTPATIENT_CLINIC_OR_DEPARTMENT_OTHER): Payer: Self-pay

## 2024-09-10 ENCOUNTER — Other Ambulatory Visit: Payer: Self-pay | Admitting: Internal Medicine

## 2024-09-10 DIAGNOSIS — Z1231 Encounter for screening mammogram for malignant neoplasm of breast: Secondary | ICD-10-CM

## 2024-09-11 ENCOUNTER — Ambulatory Visit
Admission: RE | Admit: 2024-09-11 | Discharge: 2024-09-11 | Disposition: A | Discharge status: Home / Self Care | Source: Ambulatory Visit

## 2024-09-11 DIAGNOSIS — Z1231 Encounter for screening mammogram for malignant neoplasm of breast: Secondary | ICD-10-CM

## 2024-09-18 ENCOUNTER — Encounter (INDEPENDENT_AMBULATORY_CARE_PROVIDER_SITE_OTHER): Payer: Self-pay

## 2024-09-18 ENCOUNTER — Other Ambulatory Visit (HOSPITAL_BASED_OUTPATIENT_CLINIC_OR_DEPARTMENT_OTHER): Payer: Self-pay

## 2024-09-18 MED ORDER — ROSUVASTATIN CALCIUM 20 MG PO TABS
20.0000 mg | ORAL_TABLET | Freq: Every day | ORAL | 3 refills | Status: AC
  Filled 2024-09-18 – 2024-10-28 (×4): qty 90, 90d supply, fill #0

## 2024-09-29 ENCOUNTER — Other Ambulatory Visit (HOSPITAL_BASED_OUTPATIENT_CLINIC_OR_DEPARTMENT_OTHER): Payer: Self-pay

## 2024-10-02 ENCOUNTER — Other Ambulatory Visit (HOSPITAL_BASED_OUTPATIENT_CLINIC_OR_DEPARTMENT_OTHER): Payer: Self-pay

## 2024-10-02 ENCOUNTER — Other Ambulatory Visit (INDEPENDENT_AMBULATORY_CARE_PROVIDER_SITE_OTHER): Payer: Self-pay | Admitting: Family Medicine

## 2024-10-02 DIAGNOSIS — E559 Vitamin D deficiency, unspecified: Secondary | ICD-10-CM

## 2024-10-10 ENCOUNTER — Other Ambulatory Visit (HOSPITAL_BASED_OUTPATIENT_CLINIC_OR_DEPARTMENT_OTHER): Payer: Self-pay

## 2024-10-10 MED ORDER — ESTRADIOL 0.05 MG/24HR TD PTWK
0.0500 mg | MEDICATED_PATCH | TRANSDERMAL | 3 refills | Status: AC
  Filled 2024-10-10 – 2024-11-04 (×2): qty 12, 84d supply, fill #0

## 2024-10-15 ENCOUNTER — Other Ambulatory Visit (HOSPITAL_BASED_OUTPATIENT_CLINIC_OR_DEPARTMENT_OTHER): Payer: Self-pay

## 2024-10-15 ENCOUNTER — Other Ambulatory Visit (INDEPENDENT_AMBULATORY_CARE_PROVIDER_SITE_OTHER): Payer: Self-pay | Admitting: Family Medicine

## 2024-10-15 DIAGNOSIS — E559 Vitamin D deficiency, unspecified: Secondary | ICD-10-CM

## 2024-10-16 ENCOUNTER — Other Ambulatory Visit (HOSPITAL_BASED_OUTPATIENT_CLINIC_OR_DEPARTMENT_OTHER): Payer: Self-pay

## 2024-10-23 ENCOUNTER — Encounter (HOSPITAL_BASED_OUTPATIENT_CLINIC_OR_DEPARTMENT_OTHER): Payer: Self-pay

## 2024-10-23 ENCOUNTER — Other Ambulatory Visit (HOSPITAL_BASED_OUTPATIENT_CLINIC_OR_DEPARTMENT_OTHER): Payer: Self-pay

## 2024-10-24 ENCOUNTER — Ambulatory Visit (INDEPENDENT_AMBULATORY_CARE_PROVIDER_SITE_OTHER): Admitting: Otolaryngology

## 2024-10-24 ENCOUNTER — Encounter (INDEPENDENT_AMBULATORY_CARE_PROVIDER_SITE_OTHER): Payer: Self-pay | Admitting: Otolaryngology

## 2024-10-24 VITALS — BP 110/73 | HR 89 | Temp 97.9°F | Ht 62.0 in | Wt 159.0 lb

## 2024-10-24 DIAGNOSIS — H9202 Otalgia, left ear: Secondary | ICD-10-CM | POA: Diagnosis not present

## 2024-10-24 DIAGNOSIS — H6122 Impacted cerumen, left ear: Secondary | ICD-10-CM | POA: Insufficient documentation

## 2024-10-24 NOTE — Progress Notes (Signed)
 CC: Clogging sensation in the left ear  Discussed the use of AI scribe software for clinical note transcription with the patient, who gave verbal consent to proceed.  History of Present Illness Laura [name] is a 58 year old female who presents today for evaluation of clogging sensation in her left ear.  She was recently noted to have left ear cerumen impaction.  She noticed earwax buildup during a routine visit.  No history of ear infections, hearing loss, or surgeries in the ear, nose, and throat area.  An attempt to clean the wax was made, but the procedure was painful and was aborted, prompting her to seek further evaluation. She has no previous issues with earwax buildup.  Past Medical History:  Diagnosis Date   Ankle fracture    Right   Anxiety    ASCUS favor benign 2004   colposcopy negative   Depression    Lack of motivation    Other fatigue    Shortness of breath on exertion    Vertigo    on occasion     Past Surgical History:  Procedure Laterality Date   ABDOMINAL HYSTERECTOMY  01/2010   Leiomyomata   CESAREAN SECTION  1991   x1   COLONOSCOPY N/A 09/03/2014   Procedure: COLONOSCOPY;  Surgeon: Claudis RAYMOND Rivet, MD;  Location: [facility];  Service: Endoscopy;  Laterality: N/A;  830   COLPOSCOPY     ORIF ANKLE FRACTURE Right 07/17/2019   Procedure: OPEN REDUCTION INTERNAL FIXATION (ORIF) ANKLE FRACTURE;  Surgeon: Sharl Selinda Dover, MD;  Location: [facility] OR;  Service: Orthopedics;  Laterality: Right;  90 mins   TUBAL LIGATION      Family History  Problem Relation Age of Onset   Stroke Mother    Osteoporosis Mother    Hyperlipidemia Father    Hypertension Father    Cancer Maternal Grandmother        Colon cancer   Breast cancer Paternal Aunt        ? age    Social History:  reports that she has never smoked. She has never used smokeless tobacco. She reports current alcohol use. She reports that she does not use drugs.  Allergies: No Known  Allergies  Prior to Admission medications   Medication Sig Start Date End Date Taking? Authorizing Provider  cyclobenzaprine  (FLEXERIL ) 10 MG tablet Take 1 tablet (10 mg total) by mouth at bedtime as needed. 05/15/24  Yes   estradiol  (CLIMARA  - DOSED IN MG/24 HR) 0.05 mg/24hr patch Place 0.05 mg onto the skin once a week. 12/13/20  Yes [provider]  estradiol  (CLIMARA  - DOSED IN MG/24 HR) 0.05 mg/24hr patch APPLY 1 PATCH TO SKIN ONCE A WEEK. 07/20/20 10/24/24 Yes Rosalva Sawyer, MD  estradiol  (CLIMARA  - DOSED IN MG/24 HR) 0.05 mg/24hr patch Place 1 patch (0.05 mg total) onto the skin once a week. 01/22/24  Yes   estradiol  (CLIMARA  - DOSED IN MG/24 HR) 0.05 mg/24hr patch Place 1 patch (0.05 mg total) onto the skin once a week. 02/12/24  Yes   estradiol  (CLIMARA  - DOSED IN MG/24 HR) 0.05 mg/24hr patch Place 1 patch (0.05 mg total) onto the skin once a week. 10/10/24  Yes   fluticasone (FLONASE) 50 MCG/ACT nasal spray Place 1 spray into both nostrils daily as needed for allergies.    Yes [provider]  meloxicam  (MOBIC ) 15 MG tablet Take 1 tablet (15 mg total) by mouth daily for 10 days 05/15/24  Yes  predniSONE  (STERAPRED UNI-PAK 21 TAB) 10 MG (21) TBPK tablet Use as directed. 07/01/24  Yes   predniSONE  (STERAPRED UNI-PAK 21 TAB) 5 MG (21) TBPK tablet Take as directed 02/13/24  Yes   rosuvastatin  (CRESTOR ) 10 MG tablet Take 1 tablet (10 mg total) by mouth daily. 02/12/24  Yes Beasley, Caren D, MD  rosuvastatin  (CRESTOR ) 20 MG tablet Take 1 tablet (20 mg total) by mouth daily. 09/18/24  Yes   Soft Lens Products (REWETTING DROPS) SOLN Place 1 drop into both eyes daily as needed (dry/irritated eyes.).   Yes [provider]  Testosterone  1.62 % GEL Apply 0.25 mLs topically See admin instructions. Apply 0.25mL topically twice weekly. 11/18/20  Yes [provider]  Vitamin D , Ergocalciferol , (DRISDOL ) 1.25 MG (50000 UNIT) CAPS capsule Take 1 capsule (50,000 Units total) by mouth  every 14 (fourteen) days. 05/06/24  Yes Beasley, Caren D, MD    Blood pressure 110/73, pulse 89, temperature 97.9 F (36.6 C), height 5' 2 (1.575 m), weight 159 lb (72.1 kg), SpO2 98%. Exam: General: Communicates without difficulty, well nourished, no acute distress. Head: Normocephalic, no evidence injury, no tenderness, facial buttresses intact without stepoff. Face/sinus: No tenderness to palpation and percussion. Facial movement is normal and symmetric. Eyes: PERRL, EOMI. No scleral icterus, conjunctivae clear. Neuro: CN II exam reveals vision grossly intact.  No nystagmus at any point of gaze. Ears: Auricles well formed without lesions.  Left ear cerumen impaction.  The right ear canal and tympanic membrane are normal.  Nose: External evaluation reveals normal support and skin without lesions.  Dorsum is intact.  Anterior rhinoscopy reveals congested mucosa over anterior aspect of inferior turbinates and intact septum.  No purulence noted. Oral:  Oral cavity and oropharynx are intact, symmetric, without erythema or edema.  Mucosa is moist without lesions. Neck: Full range of motion without pain.  There is no significant lymphadenopathy.  No masses palpable.  Thyroid  bed within normal limits to palpation.  Parotid glands and submandibular glands equal bilaterally without mass.  Trachea is midline. Neuro:  CN 2-12 grossly intact.   Procedure: Left ear cerumen disimpaction Anesthesia: None Description: Under the operating microscope, the cerumen is carefully removed with a combination of cerumen currette, alligator forceps, and suction catheters.  After the cerumen is removed, the TMs are noted to be normal.  No mass, erythema, or lesions. The patient tolerated the procedure well.    Assessment and Plan Assessment & Plan Clogging sensation in the left ear. - The patient is noted to have left ear cerumen impaction. - After the cerumen disimpaction procedure, both tympanic membranes and middle ear  spaces are noted to be normal. - The patient is instructed not to use Q-tips to clean her ear canals. - The patient is encouraged to call with any questions or concerns.     Laura [name] 10/24/2024, 11:24 AM

## 2024-10-28 ENCOUNTER — Other Ambulatory Visit (HOSPITAL_BASED_OUTPATIENT_CLINIC_OR_DEPARTMENT_OTHER): Payer: Self-pay

## 2024-11-04 ENCOUNTER — Other Ambulatory Visit (HOSPITAL_BASED_OUTPATIENT_CLINIC_OR_DEPARTMENT_OTHER): Payer: Self-pay

## 2024-11-07 ENCOUNTER — Other Ambulatory Visit (HOSPITAL_BASED_OUTPATIENT_CLINIC_OR_DEPARTMENT_OTHER): Payer: Self-pay

## 2024-11-07 MED ORDER — MELOXICAM 15 MG PO TABS
15.0000 mg | ORAL_TABLET | Freq: Every day | ORAL | 0 refills | Status: AC
  Filled 2024-11-07: qty 30, 30d supply, fill #0
